# Patient Record
Sex: Female | Born: 1955 | Race: Black or African American | Hispanic: No | Marital: Single
Health system: Southern US, Community
[De-identification: ages and names within clinical notes are randomized; demographics above are authoritative.]

## PROBLEM LIST (undated history)

## (undated) DIAGNOSIS — G43909 Migraine, unspecified, not intractable, without status migrainosus: Secondary | ICD-10-CM

## (undated) DIAGNOSIS — F32A Depression, unspecified: Secondary | ICD-10-CM

## (undated) DIAGNOSIS — F1410 Cocaine abuse, uncomplicated: Secondary | ICD-10-CM

## (undated) DIAGNOSIS — J45909 Unspecified asthma, uncomplicated: Secondary | ICD-10-CM

## (undated) DIAGNOSIS — F1020 Alcohol dependence, uncomplicated: Secondary | ICD-10-CM

## (undated) DIAGNOSIS — G8929 Other chronic pain: Secondary | ICD-10-CM

## (undated) DIAGNOSIS — F329 Major depressive disorder, single episode, unspecified: Secondary | ICD-10-CM

## (undated) DIAGNOSIS — M549 Dorsalgia, unspecified: Secondary | ICD-10-CM

## (undated) HISTORY — DX: Alcohol dependence, uncomplicated: F10.20

## (undated) HISTORY — PX: NO PAST SURGERIES: SHX2092

---

## 1998-05-07 ENCOUNTER — Emergency Department (HOSPITAL_COMMUNITY): Admission: EM | Admit: 1998-05-07 | Discharge: 1998-05-08 | Payer: Self-pay | Admitting: Emergency Medicine

## 1998-07-30 ENCOUNTER — Encounter: Admission: RE | Admit: 1998-07-30 | Discharge: 1998-07-30 | Payer: Self-pay | Admitting: Internal Medicine

## 1998-07-30 ENCOUNTER — Ambulatory Visit: Admission: RE | Admit: 1998-07-30 | Discharge: 1998-07-30 | Payer: Self-pay | Admitting: Internal Medicine

## 1998-08-02 ENCOUNTER — Emergency Department (HOSPITAL_COMMUNITY): Admission: EM | Admit: 1998-08-02 | Discharge: 1998-08-02 | Payer: Self-pay | Admitting: Internal Medicine

## 1998-08-02 ENCOUNTER — Encounter: Payer: Self-pay | Admitting: Internal Medicine

## 1998-08-03 ENCOUNTER — Ambulatory Visit (HOSPITAL_COMMUNITY): Admission: RE | Admit: 1998-08-03 | Discharge: 1998-08-03 | Payer: Self-pay | Admitting: Internal Medicine

## 1998-08-03 ENCOUNTER — Encounter: Admission: RE | Admit: 1998-08-03 | Discharge: 1998-08-03 | Payer: Self-pay | Admitting: Internal Medicine

## 1998-09-02 ENCOUNTER — Ambulatory Visit (HOSPITAL_COMMUNITY): Admission: RE | Admit: 1998-09-02 | Discharge: 1998-09-02 | Payer: Self-pay | Admitting: Urology

## 1998-09-02 ENCOUNTER — Encounter: Payer: Self-pay | Admitting: Urology

## 1998-10-01 ENCOUNTER — Ambulatory Visit (HOSPITAL_BASED_OUTPATIENT_CLINIC_OR_DEPARTMENT_OTHER): Admission: RE | Admit: 1998-10-01 | Discharge: 1998-10-01 | Payer: Self-pay | Admitting: Urology

## 1999-01-22 ENCOUNTER — Ambulatory Visit (HOSPITAL_COMMUNITY): Admission: RE | Admit: 1999-01-22 | Discharge: 1999-01-22 | Payer: Self-pay | Admitting: *Deleted

## 1999-02-18 ENCOUNTER — Other Ambulatory Visit: Admission: RE | Admit: 1999-02-18 | Discharge: 1999-02-18 | Payer: Self-pay | Admitting: Obstetrics

## 1999-03-16 ENCOUNTER — Ambulatory Visit (HOSPITAL_COMMUNITY): Admission: RE | Admit: 1999-03-16 | Discharge: 1999-03-16 | Payer: Self-pay | Admitting: *Deleted

## 1999-03-16 ENCOUNTER — Encounter: Payer: Self-pay | Admitting: *Deleted

## 1999-04-28 ENCOUNTER — Encounter: Payer: Self-pay | Admitting: Emergency Medicine

## 1999-04-28 ENCOUNTER — Emergency Department (HOSPITAL_COMMUNITY): Admission: EM | Admit: 1999-04-28 | Discharge: 1999-04-28 | Payer: Self-pay | Admitting: Emergency Medicine

## 1999-05-03 ENCOUNTER — Emergency Department (HOSPITAL_COMMUNITY): Admission: EM | Admit: 1999-05-03 | Discharge: 1999-05-03 | Payer: Self-pay | Admitting: Emergency Medicine

## 1999-05-17 ENCOUNTER — Encounter: Payer: Self-pay | Admitting: Specialist

## 1999-05-17 ENCOUNTER — Ambulatory Visit (HOSPITAL_COMMUNITY): Admission: RE | Admit: 1999-05-17 | Discharge: 1999-05-17 | Payer: Self-pay | Admitting: Specialist

## 1999-09-21 ENCOUNTER — Emergency Department (HOSPITAL_COMMUNITY): Admission: EM | Admit: 1999-09-21 | Discharge: 1999-09-21 | Payer: Self-pay | Admitting: Emergency Medicine

## 1999-11-12 ENCOUNTER — Emergency Department (HOSPITAL_COMMUNITY): Admission: EM | Admit: 1999-11-12 | Discharge: 1999-11-12 | Payer: Self-pay | Admitting: Emergency Medicine

## 2000-04-27 ENCOUNTER — Emergency Department (HOSPITAL_COMMUNITY): Admission: EM | Admit: 2000-04-27 | Discharge: 2000-04-27 | Payer: Self-pay | Admitting: Emergency Medicine

## 2000-05-18 ENCOUNTER — Emergency Department (HOSPITAL_COMMUNITY): Admission: EM | Admit: 2000-05-18 | Discharge: 2000-05-18 | Payer: Self-pay | Admitting: Emergency Medicine

## 2000-06-11 ENCOUNTER — Emergency Department (HOSPITAL_COMMUNITY): Admission: EM | Admit: 2000-06-11 | Discharge: 2000-06-11 | Payer: Self-pay | Admitting: Emergency Medicine

## 2000-08-01 ENCOUNTER — Encounter: Payer: Self-pay | Admitting: Emergency Medicine

## 2000-08-01 ENCOUNTER — Emergency Department (HOSPITAL_COMMUNITY): Admission: EM | Admit: 2000-08-01 | Discharge: 2000-08-01 | Payer: Self-pay | Admitting: Emergency Medicine

## 2001-02-18 ENCOUNTER — Emergency Department (HOSPITAL_COMMUNITY): Admission: EM | Admit: 2001-02-18 | Discharge: 2001-02-18 | Payer: Self-pay | Admitting: Emergency Medicine

## 2001-02-18 ENCOUNTER — Encounter: Payer: Self-pay | Admitting: Emergency Medicine

## 2001-04-11 ENCOUNTER — Encounter: Admission: RE | Admit: 2001-04-11 | Discharge: 2001-07-10 | Payer: Self-pay | Admitting: Occupational Medicine

## 2001-06-17 ENCOUNTER — Emergency Department (HOSPITAL_COMMUNITY): Admission: EM | Admit: 2001-06-17 | Discharge: 2001-06-17 | Payer: Self-pay | Admitting: Emergency Medicine

## 2001-07-02 ENCOUNTER — Emergency Department (HOSPITAL_COMMUNITY): Admission: EM | Admit: 2001-07-02 | Discharge: 2001-07-02 | Payer: Self-pay | Admitting: Emergency Medicine

## 2001-12-18 ENCOUNTER — Emergency Department (HOSPITAL_COMMUNITY): Admission: EM | Admit: 2001-12-18 | Discharge: 2001-12-18 | Payer: Self-pay | Admitting: Emergency Medicine

## 2002-03-12 ENCOUNTER — Inpatient Hospital Stay (HOSPITAL_COMMUNITY): Admission: EM | Admit: 2002-03-12 | Discharge: 2002-03-25 | Payer: Self-pay | Admitting: Psychiatry

## 2002-10-26 ENCOUNTER — Inpatient Hospital Stay (HOSPITAL_COMMUNITY): Admission: EM | Admit: 2002-10-26 | Discharge: 2002-11-04 | Payer: Self-pay | Admitting: Psychiatry

## 2002-11-11 ENCOUNTER — Emergency Department (HOSPITAL_COMMUNITY): Admission: EM | Admit: 2002-11-11 | Discharge: 2002-11-11 | Payer: Self-pay | Admitting: Physical Therapy

## 2003-01-15 ENCOUNTER — Emergency Department (HOSPITAL_COMMUNITY): Admission: EM | Admit: 2003-01-15 | Discharge: 2003-01-15 | Payer: Self-pay

## 2003-03-05 ENCOUNTER — Inpatient Hospital Stay (HOSPITAL_COMMUNITY): Admission: EM | Admit: 2003-03-05 | Discharge: 2003-03-10 | Payer: Self-pay | Admitting: Psychiatry

## 2003-09-07 ENCOUNTER — Emergency Department (HOSPITAL_COMMUNITY): Admission: EM | Admit: 2003-09-07 | Discharge: 2003-09-08 | Payer: Self-pay | Admitting: Emergency Medicine

## 2003-10-14 ENCOUNTER — Emergency Department (HOSPITAL_COMMUNITY): Admission: EM | Admit: 2003-10-14 | Discharge: 2003-10-14 | Payer: Self-pay | Admitting: Emergency Medicine

## 2004-01-31 ENCOUNTER — Emergency Department (HOSPITAL_COMMUNITY): Admission: EM | Admit: 2004-01-31 | Discharge: 2004-01-31 | Payer: Self-pay | Admitting: Emergency Medicine

## 2004-06-27 ENCOUNTER — Inpatient Hospital Stay (HOSPITAL_COMMUNITY): Admission: AD | Admit: 2004-06-27 | Discharge: 2004-07-07 | Payer: Self-pay | Admitting: Psychiatry

## 2004-07-13 ENCOUNTER — Emergency Department (HOSPITAL_COMMUNITY): Admission: EM | Admit: 2004-07-13 | Discharge: 2004-07-13 | Payer: Self-pay | Admitting: Emergency Medicine

## 2004-07-16 ENCOUNTER — Ambulatory Visit: Payer: Self-pay | Admitting: Psychiatry

## 2004-10-22 ENCOUNTER — Emergency Department (HOSPITAL_COMMUNITY): Admission: EM | Admit: 2004-10-22 | Discharge: 2004-10-22 | Payer: Self-pay | Admitting: Emergency Medicine

## 2005-02-20 ENCOUNTER — Emergency Department (HOSPITAL_COMMUNITY): Admission: EM | Admit: 2005-02-20 | Discharge: 2005-02-20 | Payer: Self-pay | Admitting: Emergency Medicine

## 2005-09-03 ENCOUNTER — Inpatient Hospital Stay (HOSPITAL_COMMUNITY): Admission: RE | Admit: 2005-09-03 | Discharge: 2005-09-13 | Payer: Self-pay | Admitting: Psychiatry

## 2005-09-03 ENCOUNTER — Ambulatory Visit: Payer: Self-pay | Admitting: Psychiatry

## 2005-09-11 ENCOUNTER — Encounter: Payer: Self-pay | Admitting: Psychiatry

## 2006-03-27 ENCOUNTER — Inpatient Hospital Stay (HOSPITAL_COMMUNITY): Admission: EM | Admit: 2006-03-27 | Discharge: 2006-03-28 | Payer: Self-pay | Admitting: Emergency Medicine

## 2006-03-28 ENCOUNTER — Inpatient Hospital Stay (HOSPITAL_COMMUNITY): Admission: RE | Admit: 2006-03-28 | Discharge: 2006-04-05 | Payer: Self-pay | Admitting: Psychiatry

## 2006-03-29 ENCOUNTER — Ambulatory Visit: Payer: Self-pay | Admitting: Psychiatry

## 2006-11-15 ENCOUNTER — Ambulatory Visit: Payer: Self-pay | Admitting: Family Medicine

## 2007-01-03 ENCOUNTER — Emergency Department (HOSPITAL_COMMUNITY): Admission: EM | Admit: 2007-01-03 | Discharge: 2007-01-03 | Payer: Self-pay | Admitting: Emergency Medicine

## 2007-01-21 ENCOUNTER — Emergency Department (HOSPITAL_COMMUNITY): Admission: EM | Admit: 2007-01-21 | Discharge: 2007-01-21 | Payer: Self-pay | Admitting: Emergency Medicine

## 2007-01-26 ENCOUNTER — Ambulatory Visit: Payer: Self-pay | Admitting: Psychiatry

## 2007-01-26 ENCOUNTER — Inpatient Hospital Stay (HOSPITAL_COMMUNITY): Admission: AD | Admit: 2007-01-26 | Discharge: 2007-02-07 | Payer: Self-pay | Admitting: Psychiatry

## 2007-02-01 ENCOUNTER — Encounter (HOSPITAL_COMMUNITY): Payer: Self-pay | Admitting: Psychiatry

## 2007-06-18 ENCOUNTER — Emergency Department (HOSPITAL_COMMUNITY): Admission: EM | Admit: 2007-06-18 | Discharge: 2007-06-18 | Payer: Self-pay | Admitting: Emergency Medicine

## 2007-06-24 ENCOUNTER — Other Ambulatory Visit: Payer: Self-pay

## 2007-06-24 ENCOUNTER — Other Ambulatory Visit: Payer: Self-pay | Admitting: Emergency Medicine

## 2007-06-25 ENCOUNTER — Ambulatory Visit: Payer: Self-pay | Admitting: Psychiatry

## 2007-06-25 ENCOUNTER — Inpatient Hospital Stay (HOSPITAL_COMMUNITY): Admission: AD | Admit: 2007-06-25 | Discharge: 2007-07-05 | Payer: Self-pay | Admitting: Psychiatry

## 2007-06-26 ENCOUNTER — Encounter: Payer: Self-pay | Admitting: Psychiatry

## 2007-08-14 ENCOUNTER — Ambulatory Visit: Payer: Self-pay | Admitting: Family Medicine

## 2007-08-28 ENCOUNTER — Ambulatory Visit (HOSPITAL_COMMUNITY): Admission: RE | Admit: 2007-08-28 | Discharge: 2007-08-28 | Payer: Self-pay | Admitting: Family Medicine

## 2007-08-28 DIAGNOSIS — Z9889 Other specified postprocedural states: Secondary | ICD-10-CM | POA: Insufficient documentation

## 2007-08-28 DIAGNOSIS — M1990 Unspecified osteoarthritis, unspecified site: Secondary | ICD-10-CM | POA: Insufficient documentation

## 2007-08-28 DIAGNOSIS — F17200 Nicotine dependence, unspecified, uncomplicated: Secondary | ICD-10-CM | POA: Insufficient documentation

## 2007-08-28 DIAGNOSIS — J449 Chronic obstructive pulmonary disease, unspecified: Secondary | ICD-10-CM

## 2007-08-28 DIAGNOSIS — J4489 Other specified chronic obstructive pulmonary disease: Secondary | ICD-10-CM | POA: Insufficient documentation

## 2007-09-14 ENCOUNTER — Ambulatory Visit: Payer: Self-pay | Admitting: Family Medicine

## 2007-11-19 ENCOUNTER — Ambulatory Visit: Payer: Self-pay | Admitting: Family Medicine

## 2007-11-19 LAB — CONVERTED CEMR LAB
ALT: 10 units/L (ref 0–35)
AST: 13 units/L (ref 0–37)
Albumin: 4.3 g/dL (ref 3.5–5.2)
Alkaline Phosphatase: 134 units/L — ABNORMAL HIGH (ref 39–117)
BUN: 15 mg/dL (ref 6–23)
Basophils Absolute: 0 10*3/uL (ref 0.0–0.1)
Basophils Relative: 0 % (ref 0–1)
CO2: 24 meq/L (ref 19–32)
Calcium: 9.8 mg/dL (ref 8.4–10.5)
Chloride: 103 meq/L (ref 96–112)
Creatinine, Ser: 0.87 mg/dL (ref 0.40–1.20)
Eosinophils Absolute: 0.5 10*3/uL (ref 0.0–0.7)
Eosinophils Relative: 8 % — ABNORMAL HIGH (ref 0–5)
Glucose, Bld: 83 mg/dL (ref 70–99)
HCT: 41.4 % (ref 36.0–46.0)
Hemoglobin: 13.3 g/dL (ref 12.0–15.0)
Lymphocytes Relative: 32 % (ref 12–46)
Lymphs Abs: 2 10*3/uL (ref 0.7–4.0)
MCHC: 32.1 g/dL (ref 30.0–36.0)
MCV: 87 fL (ref 78.0–100.0)
Monocytes Absolute: 0.6 10*3/uL (ref 0.1–1.0)
Monocytes Relative: 10 % (ref 3–12)
Neutro Abs: 3.1 10*3/uL (ref 1.7–7.7)
Neutrophils Relative %: 50 % (ref 43–77)
Platelets: 343 10*3/uL (ref 150–400)
Potassium: 4.1 meq/L (ref 3.5–5.3)
RBC: 4.76 M/uL (ref 3.87–5.11)
RDW: 13.4 % (ref 11.5–15.5)
Sodium: 142 meq/L (ref 135–145)
Total Bilirubin: 0.4 mg/dL (ref 0.3–1.2)
Total Protein: 7.1 g/dL (ref 6.0–8.3)
WBC: 6.1 10*3/uL (ref 4.0–10.5)

## 2007-11-22 ENCOUNTER — Ambulatory Visit (HOSPITAL_COMMUNITY): Admission: RE | Admit: 2007-11-22 | Discharge: 2007-11-22 | Payer: Self-pay | Admitting: Family Medicine

## 2007-11-28 ENCOUNTER — Emergency Department (HOSPITAL_COMMUNITY): Admission: EM | Admit: 2007-11-28 | Discharge: 2007-11-28 | Payer: Self-pay | Admitting: Emergency Medicine

## 2007-12-31 ENCOUNTER — Encounter (INDEPENDENT_AMBULATORY_CARE_PROVIDER_SITE_OTHER): Payer: Self-pay | Admitting: Family Medicine

## 2007-12-31 ENCOUNTER — Ambulatory Visit: Payer: Self-pay | Admitting: Family Medicine

## 2007-12-31 ENCOUNTER — Other Ambulatory Visit: Admission: RE | Admit: 2007-12-31 | Discharge: 2007-12-31 | Payer: Self-pay | Admitting: Family Medicine

## 2008-07-14 ENCOUNTER — Emergency Department (HOSPITAL_COMMUNITY): Admission: EM | Admit: 2008-07-14 | Discharge: 2008-07-15 | Payer: Self-pay | Admitting: Emergency Medicine

## 2009-01-26 ENCOUNTER — Emergency Department (HOSPITAL_COMMUNITY): Admission: EM | Admit: 2009-01-26 | Discharge: 2009-01-26 | Payer: Self-pay | Admitting: Emergency Medicine

## 2009-10-05 ENCOUNTER — Emergency Department (HOSPITAL_COMMUNITY): Admission: EM | Admit: 2009-10-05 | Discharge: 2009-10-05 | Payer: Self-pay | Admitting: Emergency Medicine

## 2009-10-07 ENCOUNTER — Emergency Department (HOSPITAL_COMMUNITY): Admission: EM | Admit: 2009-10-07 | Discharge: 2009-10-08 | Payer: Self-pay | Admitting: Emergency Medicine

## 2010-03-10 ENCOUNTER — Emergency Department (HOSPITAL_COMMUNITY): Admission: EM | Admit: 2010-03-10 | Discharge: 2010-03-10 | Payer: Self-pay | Admitting: Emergency Medicine

## 2010-11-13 ENCOUNTER — Encounter: Payer: Self-pay | Admitting: Obstetrics

## 2011-01-25 LAB — RAPID URINE DRUG SCREEN, HOSP PERFORMED
Amphetamines: NOT DETECTED
Amphetamines: NOT DETECTED
Barbiturates: NOT DETECTED
Benzodiazepines: NOT DETECTED
Benzodiazepines: NOT DETECTED
Cocaine: POSITIVE — AB
Opiates: NOT DETECTED
Opiates: NOT DETECTED
Tetrahydrocannabinol: NOT DETECTED
Tetrahydrocannabinol: NOT DETECTED

## 2011-01-25 LAB — BASIC METABOLIC PANEL
BUN: 11 mg/dL (ref 6–23)
CO2: 29 mEq/L (ref 19–32)
Calcium: 9.1 mg/dL (ref 8.4–10.5)
Chloride: 103 mEq/L (ref 96–112)
Chloride: 106 mEq/L (ref 96–112)
Creatinine, Ser: 1.04 mg/dL (ref 0.4–1.2)
GFR calc Af Amer: >60 mL/min (ref >60)
GFR calc Af Amer: >60 mL/min (ref >60)
GFR calc non Af Amer: 52 mL/min — ABNORMAL LOW (ref >60)
GFR calc non Af Amer: 55 mL/min — ABNORMAL LOW (ref >60)
Glucose, Bld: 137 mg/dL — ABNORMAL HIGH (ref 70–99)
Potassium: 3.4 mEq/L — ABNORMAL LOW (ref 3.5–5.1)
Potassium: 3.9 mEq/L (ref 3.5–5.1)
Sodium: 139 mEq/L (ref 135–145)
Sodium: 142 mEq/L (ref 135–145)

## 2011-01-25 LAB — DIFFERENTIAL
Eosinophils Absolute: 0.2 10*3/uL (ref 0.0–0.7)
Eosinophils Relative: 4 % (ref 0–5)
Lymphocytes Relative: 43 % (ref 12–46)
Lymphs Abs: 1.9 10*3/uL (ref 0.7–4.0)
Monocytes Relative: 8 % (ref 3–12)

## 2011-01-25 LAB — CBC
HCT: 41.9 % (ref 36.0–46.0)
HCT: 42 % (ref 36.0–46.0)
Hemoglobin: 13.7 g/dL (ref 12.0–15.0)
MCHC: 32.6 g/dL (ref 30.0–36.0)
MCV: 88.6 fL (ref 78.0–100.0)
MCV: 88.8 fL (ref 78.0–100.0)
Platelets: 313 10*3/uL (ref 150–400)
RBC: 4.72 MIL/uL (ref 3.87–5.11)
RBC: 4.74 MIL/uL (ref 3.87–5.11)
RDW: 13.6 % (ref 11.5–15.5)
WBC: 4.1 10*3/uL (ref 4.0–10.5)
WBC: 4.3 10*3/uL (ref 4.0–10.5)

## 2011-01-25 LAB — ETHANOL
Alcohol, Ethyl (B): <5 mg/dL (ref 0–10)
Alcohol, Ethyl (B): <5 mg/dL (ref 0–10)

## 2011-03-08 NOTE — H&P (Signed)
NAMEBEATRIS, [name]               ACCOUNT NO.:  [id number]   MEDICAL RECORD NO.:  [id number]          PATIENT TYPE:  IPS   LOCATION:  406                           [facility]ILITY[facility]  [facility]   PHYSICIAN:  Anselm Jungling, MD  DATE OF BIRTH:  10-07-1956   DATE OF ADMISSION:  06/25/2007  DATE OF DISCHARGE:                       PSYCHIATRIC ADMISSION ASSESSMENT   IDENTIFICATION:  This is a  55 year old African American female.  This  is a voluntary admission.   HISTORY OF PRESENT ILLNESS:  This patient presented in the emergency  department with suicidal thoughts after being evicted from [place] and  [address] for missing curfew one time.  This is a drug rehab house  for women.  The patient has been living there for approximately 2 weeks  and relapsed on cocaine and alcohol one time after she was evicted after  3 months of abstinence.  The patient says that she felt hopeless at the  idea of her homelessness and used scissors to make long superficial  lacerations along her left arm.  She endorses suicidal thoughts at the  thought of having to go back on the street.  She denies any a  hallucinations or homicidal thoughts.   PAST PSYCHIATRIC HISTORY:  This is one of multiple admissions for this  single [place] American female who is well-known to [place], last here April  4 to the 16th.  Longest abstinence from substances is 2 years.  She does  have a history of multiple suicide attempts by overdose, basic education  and is on social security disability for mental illness, has two  daughters ages 74 and 43, frequently homeless.  No current legal  charges.  Please note the patient has a history of a schizoaffective  disorder.   FAMILY HISTORY:  Is remarkable for sister that she says is supportive,  denies a family history of substance abuse.   MEDICAL HISTORY:  The patient has no regular primary care Ambera Fedele.  She  currently has superficial lacerations of her left arm and cough for at  least the  past 7 days accompanied by upper respiratory congestion.   PAST MEDICAL HISTORY:  Is remarkable for history of pneumonia and  polysubstance abuse, schizoaffective disorder, tobacco abuse smoking one  to one and half packs per day and poor dentition.   CURRENT MEDICATIONS:  Prozac 20 mg daily, Seroquel 400 mg p.o. q.h.s.   DRUG ALLERGIES:  None.   REVIEW OF SYSTEMS:  Was remarkable for complaining of possibly low grade  fever and cough for somewhere between seven and 10 days, productive of  yellow sputum.  No known weight loss.  No fever or chills.  Some mild to  moderate dyspnea on exertion and shortness of breath with exertion,  sensation of chest tightness but no acute chest pain.  EXTREMITIES:  No  peripheral edema.  SKIN is intact.  ABDOMEN is soft, nontender,  nondistended.  Afebrile.   PHYSICAL EXAM:  Was remarkable for coarse rales throughout, along with  left lower lobe wheezing, inspiratory and expiratory wheezes.  Full  physical exam done in the emergency room and is  noted in the record.   LABORATORY DATA:  CBC, WBC 11.0, hemoglobin 12.7, hematocrit 37.7,  platelets 383,000.  Alcohol level 37.  Chemistry, sodium 139, potassium  3.6, chloride 100, carbon dioxide 24, BUN 16 and creatinine 1.03 and  random glucose 101.  Urine drug screen positive for cocaine.  .  Salicylate less than 4,  acetaminophen less than 10.   MENTAL STATUS EXAM:  Fully alert female with poor eye contact, blunted,  a downcast gaze, cooperative, engaged in conversation but minimal speech  production.  Speech is soft, almost inaudible, most of the  time 1 or 2  word answers, positive for suicidal thoughts, fears that she will be out  on the streets again, no homicidal thought.  Denying any current  hallucinations.  Cognition preserved.   ASSESSMENT:  AXIS I:  Major depression recurrent, severe.  Polysubstance  abuse.  AXIS II:  Deferred  AXIS III:  Bronchitis, rule out pneumonia, and superficial  lacerations  left arm.  AXIS IV:  Severe issues with homelessness.  AXIS V:  Current 38 past year 69.   PLAN:  Is to voluntarily admit the patient with q. 15-minute checks in  place to alleviate suicidal ideation.  We are going to restart her  Seroquel 400 mg at bedtime and 50 mg q.4 h p.r.n. for agitation,  continue her Prozac 20 mg daily and will also put her on folic acid,  thiamine and MVI.  She denies any alcohol withdrawal symptoms at this  point.  Meanwhile we are going to get a chest x-ray and start her on  azithromycin and DuoNeb treatments b.i.d. and we will monitor those  lacerations closely for signs of infection, currently giving her some  topical Neosporin ointment.  Our Child psychotherapist has agreed to try to  [facility] and [facility] into taking her back.   Estimated length of stay 7 days      Margaret A. Lorin Picket, N.P.      Anselm Jungling, MD  Electronically Signed    MAS/MEDQ  D:  06/26/2007  T:  06/26/2007  Job:  [id number]

## 2011-03-11 NOTE — Discharge Summary (Signed)
[name], [name]                         ACCOUNT NO.:  [id number]   MEDICAL RECORD NO.:  [id number]                   PATIENT TYPE:  IPS   LOCATION:  0508                                 FACILITY:  [facility]   PHYSICIAN:  Jeanice Lim, M.D.              DATE OF BIRTH:  12/29/1955   DATE OF ADMISSION:  03/05/2003  DATE OF DISCHARGE:  03/10/2003                                 DISCHARGE SUMMARY   IDENTIFYING DATA:  This is a 55 year old African-American female voluntarily  admitted.  She presented with a history of self-inflicted injuries, hitting  things, scratching.  She reports she did this because she missed her family,  she had been drinking, using crack cocaine and marijuana, and reports  auditory hallucinations although unable to remember what the voices say.   MEDICATIONS:  Effexor in the past, noncompliant.   DRUG ALLERGIES:  No known drug allergies.   PHYSICAL EXAMINATION:  GENERAL:  Essentially within normal limits.  NEUROLOGIC:  Nonfocal.   LABORATORY DATA:  CBC and CMET: Within normal limits except for low  potassium at 3.1.   MENTAL STATUS EXAM:  The patient was in bed, dressed in pajamas, poor eye  contact.  Speech: Clear.  Mood: Depressed.  Affect: Flat.  Thought process:  Goal directed.  Thought content: Positive for auditory hallucinations,  reporting some visual hallucinations and questionable paranoid ideation,  although the patient did not appear to be actively responding to internal  stimuli.  Cognitive: Intact.  The patient was a poor historian.  Judgment  and insight: Poor.  Poor impulse control.   ADMISSION DIAGNOSES:   AXIS I:  1. Major depressive disorder, recurrent, severe with psychotic features.  2. Rule out substance-induced mood disorder.  3. Alcohol dependence.  4. Cocaine dependence.   AXIS II:  Deferred.   AXIS III:  None.   AXIS IV:  Moderate problems with primary support group and other  psychosocial issues.   AXIS V:   25/60   HOSPITAL COURSE:  The patient was admitted, ordered routine p.r.n.  medications, underwent further monitoring, and was encouraged to participate  in individual, group, and milieu therapy.  The patient was resumed on  Effexor and Seroquel p.r.n.  Seroquel was optimized, targeting psychotic  symptoms, and Effexor discontinued.  Prozac was started.  Symmetrel was  ordered for her cocaine cravings and then Risperdal was added for continued  psychotic symptoms.  The patient gradually reported a positive response,  tolerated withdrawal symptoms, requiring Librium for breakthrough  withdrawal, and reported a positive response to clinical intervention.   CONDITION ON DISCHARGE:  Her condition on discharge was markedly improved.  Mood was more euthymic.  Affect: Brighter.  Thought processes: Goal  directed.  Thought content: Negative for dangerous ideation or psychotic  symptoms.  The patient reported motivation to be compliant with the  aftercare plan.   DISCHARGE MEDICATIONS:  1. Symmetrel 100 mg  b.i.d.  2. Seroquel 300 mg q.h.s.  3. Prozac 10 mg three q.a.m.  4. Risperdal 0.5 mg one half at 12 p.m. and three q.h.s.  5. Motrin 600 mg q.6.h. p.r.n. pain.   FOLLOW UP:  The patient was discharged to follow up at [facility] on May 20 at 9:30.   DISCHARGE DIAGNOSES:   AXIS I:  1. Major depressive disorder, recurrent, severe with psychotic features.  2. Rule out substance-induced mood disorder.  3. Alcohol dependence.  4. Cocaine dependence.   AXIS II:  Deferred.   AXIS III:  None.   AXIS IV:  Moderate problems with primary support group and other  psychosocial issues.   AXIS V:  Global assessment of functioning on discharge was 55.                                               Jeanice Lim, M.D.    JEM/MEDQ  D:  04/03/2003  T:  04/03/2003  Job:  [id number]
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
## 2011-03-11 NOTE — Discharge Summary (Signed)
[name], [name]               ACCOUNT NO.:  [id number]   MEDICAL RECORD NO.:  [id number]          PATIENT TYPE:  IPS   LOCATION:  0303                          [facility]ACILITY:  [facility]   PHYSICIAN:  Anselm Jungling, MD  DATE OF BIRTH:  10-02-1956   DATE OF ADMISSION:  01/26/2007  DATE OF DISCHARGE:  02/07/2007                               DISCHARGE SUMMARY   IDENTIFYING DATA AND REASON FOR ADMISSION:  This as an inpatient  psychiatric admission for [name], a 55 year old single female, who  presented with a history of polysubstance abuse, depression and suicidal  thoughts.  She had been drinking up to half a gallon of liquor daily and  also using crack cocaine and marijuana.  Please refer to the admission  note for further details pertaining to the symptoms, circumstances, and  history that led to her hospitalization.  She was given initial Axis I  diagnoses of mood disorder NOS, rule out schizoaffective disorder,  alcohol dependence, and polysubstance abuse.   MEDICAL AND LABORATORY:  The patient was initially treated at [facility] emergency department where she was given prednisone and Z-Pak for  basilar pneumonia.  She also received supplemental potassium there for  hypokalemia.  Upon admission here, her CBC was within normal limits,  urine pregnancy was negative.  Drug screen was positive for cocaine and  opiates.  She presented as a thin, weak-appearing, middle-aged female  with productive cough.   She remained ill with profound fatigue and coughing over the first 10  days of her hospital stay and was not able to get out of bed very much  to participate in therapeutic groups and activities.  Her appetite was  poor but she was willing to take Ensure.  Towards the latter portion of  her inpatient stay, her condition gradually improved and she was more  active in the milieu program.   She was also given Protonix, 40 mg daily, and Carafate, 1 gram four  times daily, for  epigastric discomfort, with good results.  Throughout,  she was given guaifenesin up to four times daily for cough.   HOSPITAL COURSE:  As above, She presented with essentially normal mental  status with the exception of significant depression which was  exacerbated by her feeling ill and tired.  She was continued on a  regimen of Prozac and Seroquel and tolerated these well.   A challenge for this patient was determining appropriate aftercare for  her, given that she had limited available supports.  She worked closely  with the Child psychotherapist and Sports coach.   The patient had been absent suicidal ideation throughout her inpatient  stay.  On the 13th hospital day, she appeared appropriate for discharge.  She agreed to the following aftercare plan.  The patient was to follow  up at the [facility] by presenting for a walk-in appointment on any  week day and she was instructed as to how and where to do so.  She was  also connected with [facility] for further supportive  aftercare.   DISCHARGE MEDICATIONS:  1. Prozac  40 mg daily.  2. Seroquel 100 mg q.h.s.  3. Protonix 40 mg daily.  4. Carafate 1 gram four times daily.  5. Robitussin 2 teaspoons four times per day.   DISCHARGE DIAGNOSES:  AXIS I: Major depressive disorder, recurrent, and  polysubstance abuse.  AXIS II: Deferred.  AXIS III: History of gastroesophageal reflux disease, basilar pneumonia,  resolving, poor nutrition.  AXIS IV: Stressors severe.  AXIS V: Global Assessment of Functioning (GAF) on discharge 65.      Anselm Jungling, MD  Electronically Signed     SPB/MEDQ  D:  02/19/2007  T:  02/19/2007  Job:  [id number]

## 2011-03-11 NOTE — Discharge Summary (Signed)
[facility]  Patient:    FAYDRA, KORMAN Visit Number: [id number] MRN: [id number]          Service Type: PSY Location: 300 0300 02 Attending Physician:  Rachael Fee Dictated by:   Reymundo Poll Dub Mikes, M.D. Admit Date:  03/12/2002 Discharge Date: 03/25/2002                             Discharge Summary  CHIEF COMPLAINT AND PRESENT ILLNESS:  This was the first admission to [facility] for this 55 year old female voluntarily admitted with history of depression.  Also ___________ boyfriend died of cancer three months prior to this admission.  Sister brought her to mental health for evaluation and referred for inpatient admission.  Having thoughts of overdosing on medication according to the ACT.  She admitted to getting to the edge, could not take her depression anymore.  Endorsed weight loss of more than 40 pounds.  Some of it was because she did not have any money to obtain her medication.  Has not been able to keep appointments or get her medication on a regular basis.  Endorsed increased fatigue and lack of energy, progressive anhedonia.  Also reports auditory hallucinations intermittently. No homicidal ideation.  No visual hallucinations.  Claimed that the Prozac did work if she was taking regularly.  PAST PSYCHIATRIC HISTORY:  [facility].  One time prior to [facility].  Prior detox to marijuana and alcohol in 1992 in [place].  ALCOHOL/DRUG HISTORY:  Denies any current substance use, although she has a prior history.  MEDICAL HISTORY:  Migraine headaches, chronic back pain.  No sexually transmitted disease.  MEDICATIONS:  Prozac 40 mg daily, doxepin 50 mg every day, Vioxx 25 mg.  PHYSICAL EXAMINATION:  Performed and failed to show any acute findings.  MENTAL STATUS EXAMINATION:  Normal ____________ lady.  She has a flattened affect.  Poor eye contact.  Cooperative.  Appears mildly guarded.   Speech shows latency, soft in tone, normal in pace.  Intermittent answers, no spontaneity.  Withdrawn mood, mildly guarded.  Thought processes positive for thought-blocking, suicidal ideation with no specific plan.  Can contract for safety.  ADMISSION DIAGNOSES: Axis I:    1. Major depression, recurrent, severe, with psychotic features.            2. Alcohol and marijuana abuse, in remission. Axis II:   No diagnosis. Axis III:  1. Osteoarthritis.            2. Chronic low back pain. Axis IV:   Moderate. Axis V:    Global Assessment of Functioning upon admission 26; highest Global            Assessment of Functioning in the last year 62.  LABORATORY DATA:  CBC was within normal limits.  Blood chemistries were within normal limits.  Thyroid profile was within normal limits.  Drug screen was negative for substances of abuse.  HOSPITAL COURSE:  She was admitted and started intensive individual and group psychotherapy.  She wants to be placed back on Prozac.  She was kept on the doxepin.  Initially on Risperdal and was switched to Zyprexa.  She felt that this Prozac was, at the end, not effective, so weaned off Prozac and started Effexor.  As she went off the Prozac, she felt more depressed, endorsed suicidal ideation.  Zyprexa was increased.  That seemed to have helped.  There  were some dreams about killing herself.  Still endorsing some suicidal ruminations with thoughts to hurt herself, especially first thing in the morning.  We went ahead and started Zyprexa 2.5 mg in the morning and kept the 15 mg at night.  Went up to Effexor 112.5 mg twice a day.  Slowly, she started improving and, by March 25, 2002, she did admit she was doing better.  Mood was improved objectively.  She was brighter.  She was taking care of herself.  She was going to group.  She denied any further suicidal or homicidal ideation. Felt that the medication was finally kicking in and felt stable and safe enough to go  home.  DISCHARGE DIAGNOSES: Axis I:    Major depression, recurrent, with psychotic features. Axis II:   No diagnosis. Axis III:  1. Osteoarthritis.            2. Chronic low back pain. Axis IV:   Moderate. Axis V:    Global Assessment of Functioning upon discharge 55.  DISCHARGE MEDICATIONS: 1. Doxepin 50 mg at bedtime. 2. Vioxx 25 mg daily. 3. Zyprexa 5 mg in the morning, 15 mg at night. 4. Effexor XR 112.5 mg twice a day.  FOLLOW-UP:  [facility]. Dictated by:   Reymundo Poll Dub Mikes, M.D. Attending Physician:  Rachael Fee DD:  05/01/02 TD:  05/02/02 Job: [id number]

## 2011-03-11 NOTE — Discharge Summary (Signed)
NAME:  [name], MOULTRY                         ACCOUNT NO.:  [id number]   MEDICAL RECORD NO.:  [id number]                   PATIENT TYPE:  IPS   LOCATION:  0508                                 FACILITY:  [facility]   PHYSICIAN:  Jeanice Lim, M.D.              DATE OF BIRTH:  [birth year]   DATE OF ADMISSION:  10/26/2002  DATE OF DISCHARGE:  11/04/2002                                 DISCHARGE SUMMARY   IDENTIFYING DATA:  This is a female with a history of resuming alcohol,  marijuana and cocaine use.  First holiday season she experienced without her  husband, who died of lymphoma.  She is severely depressed, reporting  auditory hallucinations at the time of admission, and was suicidal.   ADMISSION MEDICATIONS:  Effexor which she had been noncompliant with.   ALLERGIES:  No known drug allergies.   PHYSICAL EXAMINATION:  Essentially within normal limits, neurologically  nonfocal.   ROUTINE ADMISSION LABS:  Essentially within normal limits.   MENTAL STATUS EXAM:  Mood was irritable and profoundly depressed, affect  restricted and blunted.  Thought process slowed but goal directed.  Thought  content positive for auditory hallucinations telling her that she should  join her husband and that was in her husband's voice.  Cognition intact.  Judgment and insight fair.   ADMISSION DIAGNOSES:   AXIS I:  1. Cocaine dependence.  2. Polysubstance abuse.  3. Major depression, severe, with psychotic features, recurrent.   AXIS II:  None.   AXIS III:  None.   AXIS IV:  Moderate, problems with primary support group and other  psychosocial issues.   AXIS V:  30/55-60.   HOSPITAL COURSE:  The patient was admitted and ordered routine p.r.n.  medications, underwent further monitoring, and was encouraged to participate  in individual, group and milieu therapy, including substance abuse  treatment.  The patient was placed on a low-dose Librium protocol and also  given Symmetrel for possible  cocaine cravings.  The patient was given Ultram  p.r.n. pain and Effexor was adjusted and Zyprexa started for auditory  hallucinations, which was titrated, and then changed to Risperdal due to  sedation from Zyprexa and continued auditory hallucinations.  The patient  reported gradual improvement with medication changes, with improvement in  mood, eating and sleeping better, reporting increase in hope and improved  judgment and insight, as well as resolution of psychotic symptoms as she was  stabilized on medications.  She denied side effects.   CONDITION ON DISCHARGE:  Markedly improved.  Mood was more euthymic, affect  brighter, thought process goal directed.  Thought content negative for  dangerous ideation or psychotic symptoms.  The patient reported motivation  to be compliant with the aftercare plan, including followup therapy and  substance abuse treatment.   DISCHARGE MEDICATIONS:  1. Effexor XR 150 mg q.a.m.  2. Risperdal 1 mg b.i.d.  3. Ambien 10 mg 1/2  q.h.s. p.r.n. sleep.  4. Vicodin 5/500 1 b.i.d. p.r.n. pain.   DISPOSITION:  The patient is to follow up at [facility] on January 14 at 10:30 with Dr. Lang Snow.   DISCHARGE DIAGNOSES:   AXIS I:  1. Cocaine dependence.  2. Polysubstance abuse.  3. Major depression, severe, with psychotic features, recurrent.   AXIS II:  None.   AXIS III:  None.   AXIS IV:  Moderate, problems with primary support group and other  psychosocial issues.   AXIS V:  Global assessment of function on discharge was 55.                                                 Jeanice Lim, M.D.    JEM/MEDQ  D:  11/13/2002  T:  11/13/2002  Job:  [id number]
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
## 2011-03-11 NOTE — H&P (Signed)
NAMENICHOLAS, [name]               ACCOUNT NO.:  [id number]   MEDICAL RECORD NO.:  [id number]          PATIENT TYPE:  IPS   LOCATION:  0403                          FACILITY:  [facility]   PHYSICIAN:  Anselm Jungling, MD  DATE OF BIRTH:  06/29/1956   DATE OF ADMISSION:  03/28/2006  DATE OF DISCHARGE:                         PSYCHIATRIC ADMISSION ASSESSMENT   DATE OF ASSESSMENT:  March 29, 2006 at 11:05 a.m.   IDENTIFYING INFORMATION:  This is a 55 year old African-American female who  is single.  This is a voluntary admission.   HISTORY OF PRESENT ILLNESS:  This is one of several admissions for this 68-  year-old with a history of schizoaffective disorder, auditory  hallucinations, and chronic substance abuse.  She reports that she had  stopped all of her medications about 2 months ago but is not able to offer  any specific reason why.  Relapsed on alcohol and has had escalating use to  the point where she says that she was drinking about 2 cases of beer daily  and using cocaine daily for at least a couple of weeks prior to admission.  She was initially admitted by way of the emergency room to the internal  medicine unit due to some bizarre motor twitching that she had been doing in  the emergency room.  On the medicine service, she was diagnosed with a  urinary tract infection and prescribed Cipro, given potassium for some  hypokalemia, and Ativan for agitation.  She endorsed suicidal ideation,  primarily passive, feeling that life is no worth living, and she has had  some auditory hallucinations, which she refused to elaborate on today.   PAST PSYCHIATRIC HISTORY:  This is one of multiple admissions to [facility] for this patient with a previous diagnosis of  schizoaffective disorder, with additional substance exacerbation of  psychosis, history of EtOH dependence and cocaine dependence and personality  disorder, not otherwise specified.  Last admission here  was in December of  2006, at which time she was stabilized on 2 mg of Risperdal and about 300 mg  of Seroquel daily and Prozac 20 mg daily.  Previous outpatient follow-up is  unclear.   SOCIAL HISTORY:  Single African-American female.  History of polysubstance  dependence.  Not currently employed.  Eleventh grade education.  She has  never married.  She is on Social Security disability.  Does have two  daughters, ages 23 and 56.  She has a history of homelessness.  Current home  situation is unclear.  No current legal charges.   FAMILY HISTORY:  Unavailable.   MEDICAL HISTORY:  The patient has no primary care Kieron Kantner.  She has a  history of tobacco abuse.  No known medical problems.  She has displayed  some rather bizarre motor twitching, which is believed to be elective  behavior.  She has previously reported a history of migraine headaches and  chronic back pain secondary to osteoarthritis and an old injury to her left  wrist, which has caused her some arthritic pain from time to time.   PAST MEDICATIONS:  Include  Prozac, Risperdal, Seroquel,  which apparently  she stopped.   CURRENT MEDICATIONS:  Protonix 40 mg daily, Cipro 500 mg p.o. b.i.d. for  urinary tract infection to be administered through June 7 and then  discontinue.  She was previously on Ativan on the internal medicine unit and  was started there on Seroquel 50 mg p.o. nightly.   DRUG ALLERGIES:  None.   POSITIVE PHYSICAL FINDINGS:  The patient's physical exam was done on the  internal medicine unit.  It is noted in the record.  On admission to the  unit here, this is a thin, frail, poorly nourished-appearing African-  American female, 5 feet 7 inches tall, 120 pounds.  Temperature 97.2, pulse  64, respirations 18, blood pressure 179/80.  She has asked to wear her  bandana on her head because she says she picks at her hair.  Other physical  findings while on admission here are some jerking of her extremities,   including legs, both arms, which appears to occur only during interviews.  When she is not aware that she is being observed, she has no twitching.  She  had some muscle weakness and was seen by physical therapy over on the  medicine unit.  Today, she has walked without assistance to the cafeteria  with an adequate strength and balance to her gait.  The patient has no  history of seizures.   DIAGNOSTIC STUDIES:  CBC:  WBC 7.4, hemoglobin 13, hematocrit 39, platelets  339,000.  Electrolytes:  Sodium 140, potassium 4.7, chloride 111, carbon  dioxide 26, BUN 10, creatinine 0.9, and platelets 117.  Liver enzymes:  SGOT  23, SGPT 17, alkaline phosphatase 71, and total bilirubin 0.7.  She did have  cardiac enzymes done as part of her workup in the emergency room, which were  unremarkable.  Her urine drug screen was positive for cocaine.  Urinalysis  was remarkable for large bacteria, positive blood and ketones.   MENTAL STATUS EXAMINATION:  This is a fully alert female who is  uncooperative.  She keeps her eyes closed, turns her head to the side.  Does  mumble minimal answers to questions.  Initiates no conversation on her own,  generally resistant to interview.  The answers that she does say are quite  garbled.  Voice is soft, barely audible.  I have to have her repeat several  answers so I can understand her.  Mood is depressed, ashamed.  She says she  feels bad about stopping her medications but will not engage much in any  kind of an interview.  She does have quite a bit of muscle jerking,  twitching of her legs during the interview when I observe her.  When she is  not aware, she does not have any muscle jerking.  She said she was told she  may have increased lead levels and that someone was going to test her for  this, but there is no record of testing for lead levels.  She endorses suicidal thought but having no current auditory hallucinations.  Cognitively, she is intact and oriented  times three.  Denies that she has a  specific plan for any suicide.  Insight is adequate.  Impulse control and  judgment within normal limits.   DIAGNOSES:  AXIS I:  Rule out schizoaffective disorder.  Rule out substance-  induced psychosis.  Alcohol abuse and dependence.  Cocaine dependence.  AXIS II:  Personality disorder, not otherwise specified.  AXIS III:  Urinary tract infection.  AXIS IV:  Deferred.  AXIS V:  Current 25, past year 9.   PLAN:  Voluntarily admit the patient with every-15-minute checks in place.  We are going to continue her Cipro as recommended by the medicine unit.  We  are going to restart her Prozac at 10 mg daily.  Increase her Seroquel to  200 mg p.o. nightly.  She has been up and about to meals and will continue a  Librium protocol today.   ESTIMATED LENGTH OF STAY:  Five to seven days.      Margaret A. Lorin Picket, N.P.      Anselm Jungling, MD  Electronically Signed    MAS/MEDQ  D:  03/29/2006  T:  03/30/2006  Job:  [id number]

## 2011-03-11 NOTE — H&P (Signed)
NAMEMARIJEAN, [name]                         ACCOUNT NO.:  [id number]   MEDICAL RECORD NO.:  [id number]                   PATIENT TYPE:  IPS   LOCATION:  0508                                 FACILITY:  [facility]   PHYSICIAN:  Jeanice Lim, M.D.              DATE OF BIRTH:  Dec 02, 1955   DATE OF ADMISSION:  03/05/2003  DATE OF DISCHARGE:  03/10/2003                         PSYCHIATRIC ADMISSION ASSESSMENT   IDENTIFYING INFORMATION:  A 55 year old African-American female voluntarily  admitted on Mar 05, 2003.   HISTORY OF PRESENT ILLNESS:  The patient presents with a history of self-  inflicted injuries.  She was hitting things and scratching, became very  angry.  She states that she did this because she misses her family.  She  reports that she has been drinking this past week, drinking a lot.  Also  has been using crack cocaine and marijuana, having positive auditory  hallucinations but she cannot remember what they are saying.  Also reports  visual hallucinations but denies any currently.  Her sleep has been poor.  She states that she feels afraid but is unable to say of what.  Her appetite  has been fair.  She has been noncompliant with her medication because she  states she forgets.   PAST PSYCHIATRIC HISTORY:  Third visit to [facility], was here  in January 2004.  She sees Dr. Hortencia Pilar at [facility].  She has a history of cutting, last episode was 2 months ago.   SOCIAL HISTORY:  She is a 55 year old single African-American female, 2  children ages 36 and 48.  She lives alone.  She receives SSI.  No legal  charges.   FAMILY HISTORY:  Unknown.   ALCOHOL DRUG HISTORY:  The patient smokes cigarettes, uses marijuana and  crack cocaine, has been having some alcohol use recently.   PAST MEDICAL HISTORY:  Primary care Kelechi Astarita is none.  Medical problems are  none.   MEDICATIONS:  Has been on Effexor but has been noncompliant.  Unsure of  the  dosage.   DRUG ALLERGIES:  No known allergies.   PHYSICAL EXAMINATION:  On the chart, with no significant findings.  Her  laboratory data:  CBC is within normal limits.  CMET:  Potassium of 3.1,  protein of 5.9, albumin off 3.2.   MENTAL STATUS EXAM:  She is in the bed.  She is dressed in pajamas.  No eye  contact.  Speech is clear.  Mood is depressed, affect is flat.  Thought  processes:  The patient endorsing positive auditory hallucinations, positive  visual hallucinations, some questionable paranoid ideation but does not  appear to be actively responding to internal stimuli.  Cognitive:  The  patient is a poor historian.  Her judgment and insight are poor, poor  impulse control.   ADMISSION DIAGNOSES:   AXIS I:  1. Major depression, recurrent, severe.  2.  Rule out substance-induced mood disorder.  3. Alcohol abuse.  4. Cocaine abuse.   AXIS II:  Deferred.   AXIS III:  None.   AXIS IV:  Problems with primary support group and other psychosocial  problems, noncompliance and drug use.   AXIS V:  Current is 25, this past year 65.   PLAN:  Voluntary admission for depression and psychosis and polysubstance  abuse.  Contract for safety, check every 15 minutes.  Will stabilize mood  and thinking so the patient can be safe.  Will check labs.  The patient is  to remain alcohol and drug free.  Will resume her Effexor.  Medication  compliance was discussed with the patient to optimize functioning and to  prevent relapse.  Librium will be available for withdrawal symptoms.  The  patient to follow up with mental health.   TENTATIVE LENGTH OF CARE:  3-5 days.      Landry Corporal, N.P.                       Jeanice Lim, M.D.    JO/MEDQ  D:  03/18/2003  T:  03/18/2003  Job:  [id number]

## 2011-03-11 NOTE — Discharge Summary (Signed)
[name], [name]               ACCOUNT NO.:  [id number]   MEDICAL RECORD NO.:  [id number]          PATIENT TYPE:  IPS   LOCATION:  0406                          FACILITY:  [facility]   PHYSICIAN:  Anselm Jungling, MD  DATE OF BIRTH:  Dec 12, 1955   DATE OF ADMISSION:  06/25/2007  DATE OF DISCHARGE:  07/04/2007                               DISCHARGE SUMMARY   IDENTIFYING DATA/REASON FOR ADMISSION:  This is an inpatient psychiatric  admission for [name], a 54 year old African-American female, who was  brought in by her sister.  She has had several admissions with [place]  previously for problems related to chronic substance abuse and  depression.  Again, she presented for similar reasons, having relapsed  on cocaine.  She also came to [place] with pneumonia.  Please refer to the  admission note for further details pertaining to the symptoms,  circumstances and history that led to her hospitalization.   INITIAL DIAGNOSTIC IMPRESSION:  She was given initial AXIS I diagnoses  of cocaine dependence, polysubstance abuse, and substance-induced mood  disorder.   MEDICAL/LABORATORY:  The patient had pneumonia at the time of her  admission.  She was medically and physically assessed by the psychiatric  nurse practitioner and followed throughout her stay.  She was treated  appropriately for her pneumonia, which resolved over the course of her  hospital stay.  There were no other significant medical issues.  She was  continued on her usual Protonix 40 mg daily for GERD.   HOSPITAL COURSE:  The patient was admitted to the adult inpatient  service.  She presented as a well-nourished, well-developed, but ill-  appearing woman who was fully oriented.  She was very tired and had very  Older energy.  She was quite depressed and withdrawn initially.  There  were no signs or symptoms of psychosis or delusionality.  There was no  active suicidal ideation.  She was continued on her usual psychotropic  regimen that  included Prozac and Seroquel.   She was involved in the therapeutic milieu and groups and activities  geared towards 12-step recovery, although she was a poor and reluctant  participant in the treatment program.  She was quite hopeless and did  not seem to hold out much hope for a better future for herself.   She worked with the casemanager towards developing an aftercare plan  that would include some form of stable, safe, sober housing.   She had some intermittent suicidal thoughts during her stay, but she was  able to identify these as related to her sense of hopelessness about  where she could go following discharge.   Eventually, she accepted our strong recommendation to once again accept  placement in a halfway house for sober women, an [employer] [facility].   On the 10th hospital day, the patient was discharged to an [facility]  interview.  She agreed to the following aftercare plan.   AFTERCARE:  The patient was to follow up at the [facility] with an  appointment with her psychiatrist on July 12, 2007.   DISCHARGE MEDICATIONS:  1. Prozac  20 mg daily.  2. Seroquel 200 mg q.h.s.  3. Protonix 40 mg daily.  4. Ventolin inhaler as needed.  5. Atrovent inhaler as needed.   DISCHARGE DIAGNOSES:  AXIS I:  Cocaine dependence, early remission.  Polysubstance abuse.  Substance-induced mood disorder.  AXIS II:  Deferred.  AXIS III:  Status post pneumonia.  AXIS IV:  Stressors:  Severe.  AXIS V:  GAF on discharge 50.      Anselm Jungling, MD  Electronically Signed     SPB/MEDQ  D:  07/06/2007  T:  07/06/2007  Job:  [phone]

## 2011-03-11 NOTE — H&P (Signed)
[name], [name]               ACCOUNT NO.:  [id number]   MEDICAL RECORD NO.:  [id number]          PATIENT TYPE:  IPS   LOCATION:  0401                          FACILITY:  [facility]   PHYSICIAN:  Anselm Jungling, MD  DATE OF BIRTH:  Nov 29, 1955   DATE OF ADMISSION:  09/03/2005  DATE OF DISCHARGE:                         PSYCHIATRIC ADMISSION ASSESSMENT   IDENTIFYING INFORMATION:  This is a voluntary admission to the services of  Dr. Geralyn Flash.  This is a 55 year old single African-American female.  Apparently, she presented at [facility] yesterday  requesting help for depression and relapse on substance abuse.  She was sent  to the emergency department for medical clearance.  While in the emergency  room, she was reporting suicidal ideation with unspecified auditory and  visual hallucinations.  She states that she has been using alcohol and  cocaine since last discharge two years ago.  No specific situational  stressor identified at this time.  She reports that she hates herself, her  life and wants to go to sleep and never wake up.  She states that she does  hear auditory voices calling her name and she has self-mutilated in the past  by burning herself.  She was not able to contract for safety.  Admission was  felt to be advisable.   PAST PSYCHIATRIC HISTORY:  She was last here in 2004.  She has two inpatient  admissions at that time and she has been followed as an outpatient since  1998 at [facility].   SOCIAL HISTORY:  She states that she went to the 11th grade.  She never  married.  She gets income from [employer].  She states she has a daughter in her  78s and a daughter in her 20s.  She could not be anymore specific than that.   FAMILY HISTORY:  She reports that her parents and siblings use alcohol.  No  other substances.  She has minimal involvement with her family and it is  unknown whether she indeed had any abuse.   PRIMARY CARE  PHYSICIAN:  She does not have one.   MEDICAL PROBLEMS:  She states she has arthritis.   MEDICATIONS:  She is currently prescribed Seroquel 300 mg q.h.s., trazodone  150 mg q.h.s., Prozac 20 mg p.o. q.a.m.  However, she is noncompliant.   ALLERGIES:  No known drug allergies.   PHYSICAL EXAMINATION:  She is 5 feet 7 inches tall.  She weighs 120 pounds.  Temperature is 97.4, blood pressure is 118/82 to 133/94, pulse is 62 to 66  and she states that she has had significant weight loss in the past six  months.  However, she has also been using crack cocaine.  The remainder of  her physical examination is as per the ER.  It is not repeated at this time.   LABORATORY DATA:  She had no worrisome labs.  Her CBC and her chemistries  were within normal limits with the exception of her glucose which was  slightly elevated at 106.   MENTAL STATUS EXAM:  She is drowsy.  She is appropriately groomed, dressed  and nourished.  She has an extensive braiding in her hair.  Her speech is  somewhat slurred.  Her mood is depressed.  Her affect is congruent.  Her  thought processes are not clear.  Judgment and insight are poor.  Concentration and memory are poor.  Intelligence is average to below  average.  She currently denies command hallucinations.  She is passively  suicidal.  She would still hurt herself.   DIAGNOSES:  AXIS I:  Substance abuse, cocaine and marijuana.  Schizoaffective disorder.  Noncompliant with currently prescribed  medications.  AXIS II:  Personality disorder.  AXIS III:  Arthritis.  AXIS IV:  Substance abuse.  AXIS V:  25.   PLAN:  Will admit for safety and stabilization.  We will restart her  currently prescribed medications.  We will also add some Voltaren XR to help  with her arthritis pain and we will help identify outside support groups so  hopefully she will not relapse again.      Mickie Leonarda Salon, P.A.-C.      Anselm Jungling, MD  Electronically  Signed    MD/MEDQ  D:  09/03/2005  T:  09/03/2005  Job:  [id number]

## 2011-03-11 NOTE — H&P (Signed)
NAMEFILIPPA, [name]               ACCOUNT NO.:  [id number]   MEDICAL RECORD NO.:  [id number]          PATIENT TYPE:  IPS   LOCATION:  0303                          FACILITY:  [facility]   PHYSICIAN:  Anselm Jungling, MD  DATE OF BIRTH:  15-May-1956   DATE OF ADMISSION:  01/26/2007  DATE OF DISCHARGE:                       PSYCHIATRIC ADMISSION ASSESSMENT   IDENTIFYING INFORMATION:  This is a 55 year old single African-American  female voluntarily admitted on January 26, 2007.   HISTORY OF PRESENT ILLNESS:  The patient presents with a history of  polysubstance abuse, depression and suicidal thoughts.  He has been  drinking up to half a gallon of liquor.  Also has been using crack  cocaine and marijuana.  There is no history of any seizures or  blackouts.   PAST PSYCHIATRIC HISTORY:  The patient has had a few admissions to  [facility].  She was here last in June of 2007 for psychotic  symptoms and substance abuse.  No current outpatient mental health  treatment.   SOCIAL HISTORY:  This is a 55 year old African-American female, has four  children.  The patient has a 10th grade education.   FAMILY HISTORY:  Her siblings are involved in alcohol and drug use.   ALCOHOL/DRUG HISTORY:  The patient smokes and alcohol drinking habits  are described above.   PRIMARY CARE PHYSICIAN:  Unknown.   MEDICAL PROBLEMS:  The patient currently has bibasilar pneumonia.  Was  seen in the emergency room.  Had a chest x-ray.  Was given her initial  dose of Zithromax.  The patient has a chronic cough.  There is no other  apparent health issues at this time.   MEDICATIONS:  Has been in the past on Prozac, Seroquel and albuterol  inhalers.   ALLERGIES:  No known allergies.   PHYSICAL EXAMINATION:  The patient was assessed at [facility].  Prednisone 60 mg, was prescribed a Z-Pak.  She also  received 40 mEq of K-Dur for a potassium of 3.2.  Her temperature is  97.8, heart rate  98, respirations 26, blood pressure 126/83.  She was  90% saturated.  She is 5 feet 7 inches tall and 121 pounds.   LABORATORY DATA:  Her CBC was within normal limits.  Her potassium is  3.2.  Urine pregnancy test is negative.  Drug screen was positive for  cocaine, positive for opiates.  Her alcohol level was 17.  Urinalysis  shows large but her strep screen was negative.  Her protein on urine was  30.  Her chest x-ray on January 26, 2007 showed bibasilar pneumonia.   Other physical findings include this is a thin, weak-appearing middle-  aged female currently lying in bed, has a mask on her face, having a  productive cough.   MENTAL STATUS EXAM:  She is in the bed.  Sleepy.  Middle-aged female.  The patient has a mask on, coughing with a history of pneumonia.  Speech  is soft-spoken, providing few responses.  The patient's mood is tired  and sick.  The patient again appears weak but in no acute  respiratory  stress at this time.  Thought processes:  There is no apparent delusions  or hallucinations.  Cognitive function:  The patient seems aware of  herself and situation.  She is a poor historian.   DIAGNOSES:  AXIS I:  Mood disorder.  Rule out schizoaffective disorder.  Polysubstance abuse.  AXIS II:  Deferred.  AXIS III:  Pneumonia.  AXIS IV:  Other psychosocial problems, medical problems, possible other  problems with housing and support.  AXIS V:  Current 35.   PLAN:  Contract for safety.  Stabilize mood and thinking.  Will work on  relapse prevention.  Will monitor respiratory system.  Will check a  pulse ox.  Continue with her Z-Pak.  Will continue with her nebulizer  treatments.  Will reinforce medication compliance.  Casemanager is to  assess her living situation and potential rehab and follow-up.   TENTATIVE LENGTH OF STAY:  Five to six days.      Landry Corporal, N.P.      Anselm Jungling, MD  Electronically Signed    JO/MEDQ  D:  01/29/2007  T:  01/29/2007   Job:  [id number]

## 2011-03-11 NOTE — Discharge Summary (Signed)
NAMEKEYONTA, MADRID NO.:  [id number]   MEDICAL RECORD NO.:  [id number]          PATIENT TYPE:  IPS   LOCATION:  0407                          FACILITY:  [facility]   PHYSICIAN:  Jeanice Lim, M.D. DATE OF BIRTH:  Dec 02, 1955   DATE OF ADMISSION:  09/03/2005  DATE OF DISCHARGE:  09/13/2005                                 DISCHARGE SUMMARY   IDENTIFYING DATA:  This is a 55 year old African-American female, presenting  to [facility] yesterday requesting help for  depression and relapse on substances, sent to the emergency room for medical  clearance, reporting suicidal ideation with unspecified auditory and visual  hallucinations.  Had been using alcohol and cocaine since last discharge 2  years ago.  Specific situational stressor unable to identify at this time.  Reports that she hates her life and wants to go to sleep and never work up,  hears voices calling her name, and that she has also self-mutilated in the  past by burning herself.  Unable to contract for safety.   PAST PSYCHIATRIC HISTORY:  Last here in 2004, two inpatient admissions at  that time.  Had been followed up outpatient since 1998 at [facility].   MEDICATIONS:  Prescribed Seroquel, trazodone and Prozac, but the patient  admits to noncompliance with medications.   ALLERGIES:  No known drug allergies.   PHYSICAL AND NEUROLOGICAL EXAMINATION:  Within normal limits.   ROUTINE ADMISSION LABS:  Within normal limits. Glucose slightly elevated at  106.   MENTAL STATUS EXAM:  Drowsy, appropriately groomed and dressed and  nourished, extensive braiding in her hair.  Speech somewhat slurred.  Mood  depressed, affect congruent, thought process mostly goal directed.  Judgment  and insight quite poor.  Cognition was intact.  The patient denied command  hallucinations and reports passive suicidal ideation, still feeling that she  would hurt herself if not  in the hospital.   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  AXIS II:  Personality disorder not otherwise specified.  AXIS III:  Arthritis.  AXIS IV:  Multiple psychosocial issues and sequelae of polysubstance abuse,  and noncompliance with chronic mental illness treatment.  AXIS V:  25/55.   HOSPITAL COURSE:  The patient was admitted and ordered routine p.r.n.  medications, underwent further monitoring, and was encouraged to participate  in individual, group and milieu therapy.  Medications were reconciled.  The  patient was resumed on Seroquel and Voltaren for arthritis.  Risperdal was  started and Wellbutrin to target depressive symptoms.  Wellbutrin then  stopped and Risperdal optimized, Seroquel adjusted to restore sleep but to  minimize over-sedation.  The patient gradually showed improvement with  restoration of sleep cycle, improvement in thought process, decrease in  paranoia, resolution of voices and suicidal thoughts.  The patient had no  dangerous ideation at the time of discharge, no side effects of medications,  and reported increased insight regarding the importance  of compliance and  motivation to follow up with her aftercare plan,  which she helped  participate in developing.  The patient was discharged after medication  education on:  1.  Darvocet-N 100 every 6 hours p.r.n. pain, given a limited supply.  2.  Risperdal 2 mg q.8 p.m.  3.  Seroquel 100 mg 5 at 8 p.m.  4.  Trazodone 100 mg 8 p.m.  5.  Prozac 20 mg q.a.m.  6.  Voltaren 50 mg q.12 p.r.n. arthritic pain.  7.  Ambien 10 mg q.h.s. p.r.n. insomnia.  8.  Colace 100 mg b.i.d.  9.  Cogentin 1 mg b.i.d.   The patient is to follow up with the [facility] on Thursday, November  30 at 1 p.m. with Dr. Lang Snow.    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  AXIS II:  Personality disorder not otherwise specified.  AXIS III:  Arthritis.  AXIS IV:  Multiple psychosocial and sequelae of polysubstance abuse, and  noncompliance with chronic mental illness treatment.  AXIS V:  Global assessment of functioning on discharge was 55.      Jeanice Lim, M.D.  Electronically Signed     JEM/MEDQ  D:  10/02/2005  T:  10/02/2005  Job:  [id number]

## 2011-03-11 NOTE — Discharge Summary (Signed)
[name], [name]               ACCOUNT NO.:  [id number]   MEDICAL RECORD NO.:  [id number]          PATIENT TYPE:  IPS   LOCATION:  0400                          FACILITY:  [facility]   PHYSICIAN:  Anselm Jungling, MD  DATE OF BIRTH:  1956-09-03   DATE OF ADMISSION:  03/28/2006  DATE OF DISCHARGE:  04/05/2006                                 DISCHARGE SUMMARY   IDENTIFYING DATA AND REASON FOR ADMISSION:  This was one of several [facility]  inpatient admissions for [name], a 55 year old African-American female, who  presented with psychosis and substance abuse.  She was admitted for  detoxification from drug abuse and stabilization of psychotic symptoms, most  likely related to schizoaffective disorder.  Please refer to the admission  note for further details pertaining to the symptoms, circumstances and  history that led to her hospitalization.  She was given an initial Axis I  diagnosis of psychosis NOS, rule out schizoaffective disorder, rule out  substance-induced psychosis, and polysubstance abuse.   MEDICAL AND LABORATORY:  The patient was medically and physically assessed  by the psychiatric nurse practitioner.  She came to [place] with a history of  urinary tract infection and muscle twitching NOS.  She had a physical  therapy evaluation to assess mobility and ataxia.  She was deemed to be in  need of physical therapy.  She was instructed to use a walker for safety.   HOSPITAL COURSE:  The patient was admitted to the adult inpatient  psychiatric service.  She presented as a thin female who in initial  interview was in bed and awake, but flat with latent responses.  She was  fully oriented.  Her verbal responses were minimal.  She offered no specific  complaints.  She made no overtly delusional statements but appear to be  quite guarded.   The patient was treated for her urinary tract infection with Cipro and  completed that course.  She was treated with psychotropic medications  including  Prozac up to 20 mg daily, Seroquel up to 350 mg q.h.s., Ambien at  bedtime as needed for sleep 10 mg, Ativan 1 mg every 6 hours as needed, and  Flexeril 10 mg 1/2 to 1 tablet 3 times daily for back pain.  She was also  given Protonix 40 mg daily for GERD.   The patient remained isolative and reclusive, spending a great deal of time  in bed, remaining guarded.  She reported intermittently feeling unsafe and  entertained suicidal thoughts during her inpatient stay.  However, she was  generally cooperative.   Towards the latter portion of her hospital stay, which spanned 9 days, she  expressed worries that she might not be able to maintain abstinence from  drugs after being discharged.  She described her neighborhood as being drug  infested and was anxious about the prospect of returning there.  She  continued withdrawn and isolative, and engaging in minimal conversation with  Dr. Katrinka Blazing, who cared for her during the latter portion of her hospital stay.   On the 9th hospital day, the patient told Dr. Katrinka Blazing that  she felt like she  would like to be discharged.  She expressed more optimism about her ability  to cope in spite of her negative neighborhood environment.  Dr. Katrinka Blazing noted  that the patient's mental status had improved from her admission status,  being less depressed and anxious.  Her affect was still constricted overall,  but showed somewhat wider range than that upon admission.  The patient  denied thoughts of suicide or self-injurious behavior.  She denied auditory  and visual hallucinations, expressed no delusional ideas, and did not appear  to be experiencing paranoia.  Her thoughts were logical, linear, and goal-  directed.  She was felt to be appropriate for discharge.   AFTERCARE:  The patient was to follow-up at [facility]  with an appointment on June 14, the day following discharge.  She was also  to follow-up with Sheliah Mends at [facility] for an intake  on June 26 for general services.   DISCHARGE MEDICATIONS:  1.  Protonix 40 mg daily.  2.  Prozac 20 mg daily.  3.  Naproxen 500 mg twice daily.  4.  Seroquel 100 mg, 3-1/2 tablets at bedtime.  5.  Ambien 10 mg h.s. p.r.n. insomnia.  6.  Lorazepam 1 mg q.6h. p.r.n. agitation.  7.  Flexeril 5-10 mg t.i.d. for back pain.   DISCHARGE DIAGNOSES:  AXIS I:  Schizoaffective disorder NOS.  AXIS II:  Deferred.  AXIS III:  Gastroesophageal reflux disease, back pain, status post urinary  tract infection.  AXIS IV:  Stressors severe.  AXIS V:  Global assessment of functioning on discharge 60.           ______________________________  Anselm Jungling, MD  Electronically Signed     SPB/MEDQ  D:  04/21/2006  T:  04/21/2006  Job:  [phone] [id number]

## 2011-03-11 NOTE — Discharge Summary (Signed)
[name], [name]               ACCOUNT NO.:  [id number]   MEDICAL RECORD NO.:  [id number]          PATIENT TYPE:  IPS   LOCATION:  0400                          FACILITY:  [facility]   PHYSICIAN:  Jasmine Pang, M.D. DATE OF BIRTH:  1955/11/19   DATE OF ADMISSION:  03/28/2006  DATE OF DISCHARGE:  04/05/2006                                 DISCHARGE SUMMARY   IDENTIFICATION:  This is a 55 year old African-American female who is  single.  This was a voluntary admission on March 28, 2006.   HISTORY OF PRESENT ILLNESS:  This is one of several admissions for this 44-  year-old with a history of schizoaffective disorder, auditory hallucinations  and chronic substance abuse.  She reports that she had stopped all her  medications about two months ago but is not able to offer any specific  reason why.  She relapsed on alcohol and has had escalating use to the point  where she says she was drinking about two cases of beer daily.  She was also  using cocaine daily for at least a couple weeks prior to admission.  She was  initially admitted by way of the emergency room to the internal medicine  unit due to some bizarre motor twitching that was seen in the ED.  On the  medicine service, she was diagnosed with a urinary tract infection and  prescribed Cipro, given potassium for some hypokalemia, and Ativan for  agitation.  She endorsed suicidal ideation, primarily passive, feeling that  life is not worth living.  She has had some auditory hallucinations which  she did not want to elaborate on at the time of admission.   PAST PSYCHIATRIC HISTORY:  This is one of multiple admissions to [facility] for this patient.  She has a diagnosis of  schizoaffective disorder with the additional substance exacerbation of  psychosis, and she also has a history of alcohol dependence and cocaine  dependence and personality disorder, NOS.  She was last here in December of  2006, at which  time she was stabilized on 2 mg of Risperdal and about 300 mg  of Seroquel daily and Prozac 20 mg daily.  Her previous outpatient follow-up  is unclear.   ADMISSION DIAGNOSIS:  Schizoaffective disorder with rule out substance-  induced psychosis.  Alcohol dependence and cocaine dependence, as well as  personality disorder, not otherwise specified.  For further admission  information, see dictated psychiatric admission assessment in the chart.   PHYSICAL EXAMINATION:  Physical exam was done in the medicine service at  [facility] during her inpatient stay there.  She was in no acute  physical distress.  No acute distress when sent to our unit.   ADMISSION LABORATORY:  CBC was grossly within normal limits.  Basic  metabolic panel was grossly within normal limits except for an elevated  glucose of 117.  Hepatic panel was within normal limits.  Cardiac enzymes  were unremarkable.  UDS was positive for cocaine.  Urinalysis revealed some  blood and ketones (the patient has a urinary tract infection that  is  currently being treated with Cipro), and then the TSH was within normal  limits.   HOSPITAL COURSE:  Upon admission, the patient was started on Ativan 1 mg  p.o. q.6 hours p.r.n. agitation or withdrawal symptoms and Seroquel 100 mg  p.o. q.h.s. and 50 mg q.6 hours p.r.n. agitation.  On March 28, 2006, the  patient was started on Protonix 40 mg daily.  Cipro 500 mg p.o. b.i.d. was  continued to stop on March 31, 2006.  On March 28, 2006, the patient was also  given a walker for her unsteady gait.  On March 29, 2006, Prozac 10 mg daily  was started.  Seroquel was increased to 200 mg p.o. q.h.s. and 50 mg t.i.d.  p.r.n. agitation.  On April 01, 2006, Prozac was increased to 20 mg daily.  Seroquel was increased to 300 mg p.o. q.h.s. and 50 mg p.o. t.i.d. p.r.n.  agitation.  On April 01, 2006, the patient was also begun on Flexeril 5 mg one  to two tablets p.o. t.i.d. p.r.n. back pain.  On April 02, 2006, the patient  was started on Aleve 500 mg p.o. b.i.d. for back pain, and her Seroquel was  also increased to 350 mg p.o. q.h.s., with the continuation of the 50 mg  p.o. t.i.d. p.r.n. agitation.  The patient tolerated these medications well  with no significant side effects other than some sedation.   The patient met with her doctor, who was originally Dr. Electa Sniff, for an  initial assessment on March 29, 2006.On March 30, 2006, the patient was in bed.  She stated her back had been bothering her.  She slept restlessly.  She was  eating meals.  She was not participating in groups.  She had no complaints  referable to meds.  There were no overtly delusional statements.  She denied  auditory hallucinations but was very guarded overall.  On March 31, 2006, the  patient continued to spend virtually all her time in bed complaining of back  pain.  There was no significant change in mental status.  She requests pain  medication but was told by Dr. Electa Sniff that this was could not be prescribed  due to her chemical dependence issues.  She did not appear psychotic but  appeared more depressed.  On June 8 and April 01, 2006, through the weekend,  the patient continued to have a somewhat guarded affect and mood with  intermittent thoughts of suicide.  She continued to have nightmares of drug  use and violence, with frequent awakenings at night.  Her mood was guarded  and depressed.  It was positive suicidal ideation.  She continued to have  low back pain.   On April 04, 2006, I took over the patient's care in Dr. Barrett Shell absence.  She was feeling somewhat better, and we discussed the possibility of  discharge.  She was worried that she may not be able to maintain her  sobriety if she went home.  She described her neighborhood as drug-infested  and was quite frightened at the prospect of returning home.  She was withdrawn and isolative with poor eye contact.  On April 05, 2006, the  patient changed  her mind and stated she felt like she would like to go home  today.  She felt more optimistic about her ability to cope in spite of her  negative neighborhood.  Mental status was improved from her admission  status.  Mood was less depressed and anxious.  Affect still constricted but  wider range.  No suicidal or homicidal ideation.  No self-injurious  behavior.  No auditory or visual hallucinations or paranoia.  Thoughts are  logical, linear, and goal-directed.  Cognitive grossly within normal limits.  The patient will be discharged and return to [facility] for follow-up.   DISCHARGE DIAGNOSES:  AXIS I:  Schizoaffective disorder, depressed mood,  substance-induced psychosis, polysubstance dependence.  AXIS II:  Personality disorder, not otherwise specified.  AXIS III:  Urinary tract infection, resolved with treatment with Cipro.  AXIS IV:  Severe (psychosocial problems, psychosocial issues).  AXIS V:  Global Assessment of Functioning upon discharge was 40, Global  Assessment of Functioning upon admission was 25, Global Assessment of  Functioning highest past year was 59.   DISCHARGE PLANS:  There were no specific dietary restrictions.  Activity  level will be limited by her back pain and should continue to be assessed by  her doctor.   DISCHARGE MEDICATIONS:  Protonix 40 mg p.o. daily, fluoxetine/HCL 20 mg  daily, naproxen 500 mg p.o. b.i.d., Seroquel 350 mg at bedtime, Ambien 10 mg  at bedtime, lorazepam 1 mg every six hours as needed for anxiety, Flexeril  10 mg one-half to one pill three times daily for back pain.   POST-HOSPITAL CARE PLAN:  The patient will be followed at the [facility] for medication management on June 14 at 9:30  a.m.  She will see Stephens Shire for case management on June 26 at 2 p.m.      Jasmine Pang, M.D.  Electronically Signed     BHS/MEDQ  D:  04/11/2006  T:  04/12/2006  Job:  [id number]

## 2011-03-11 NOTE — H&P (Signed)
NAME:  [name], [name]                         ACCOUNT NO.:  [id number]   MEDICAL RECORD NO.:  [id number]                   PATIENT TYPE:  IPS   LOCATION:  0406                                 [facility]ACILITY:  [facility]   PHYSICIAN:  Geoffery Lyons, M.D.                   DATE OF BIRTH:  1956-08-14   DATE OF ADMISSION:  06/27/2004  DATE OF DISCHARGE:                         PSYCHIATRIC ADMISSION ASSESSMENT   IDENTIFYING INFORMATION:  This is a voluntary admission.  This is a 55-year-  old African-American female who is currently homeless, and she is also  single.  She presented to the emergency department yesterday because of ear  pain.  She confided to an RN that she had suicidal ideation with a gesture  of attempting to stab herself.  She began using drugs due to the deaths of  fiances, two of them, and then her mother.  She wants to be in her own place  and be happy.  Apparently she was recently evicted and has been living as  best she can since June wherever.   PAST PSYCHIATRIC HISTORY:  She has had several prior [facility] admissions, the last being in May of 2004.  She acknowledges being  followed at [facility] by Dr. Hortencia Pilar and Madaline Savage.  She has missed several appointments.  She was last seen in May of  2005.   SOCIAL HISTORY:  She finished the 11th grade.  She has never married.  She  has 2 daughters, ages 47 and 71.  She is disabled.  She gets an SSDI check  for $500 for her back and mental health.   FAMILY HISTORY:  She denies.   ALCOHOL AND DRUG ABUSE:  She does not commit to when she actually started  using, but she uses cocaine and alcohol.   CURRENT MEDICATIONS:  She has been prescribed Prozac 20 mg and Seroquel 200  mg at h.s. by Dr. Hortencia Pilar.  This was confirmed at [facility] on [address]  and they were last filled July 27.   ALLERGIES:  No known drug allergies.   POSITIVE PHYSICAL FINDINGS:  PHYSICAL EXAMINATION:  Physical  examination was  not repeated.  It was documented in the ER.  She is thin.  She did report  having had many sexual partners and an sexually-transmitted disease workup  is in progress.   MENTAL STATUS EXAM:  She is alert and oriented, although she is drowsy.  Her  appearance and behavior:  She is disheveled.  She is cooperative.  Her motor  is normal.  She has fair eye contact.  Her speech is a normal rate, rhythm  and tone, her mood is depressed, her affect is sad.  Her thought processes  are clear, rational, and goal oriented.  She would like a place of her own  to live.  There were no delusions or paranoia.  Concentration  and memory are  intact.  Judgment and insight are fair.  Her intelligence is average.  She  denies suicidal or homicidal ideation.  She denies auditory or visual  hallucinations.   ADMISSION DIAGNOSES:   AXIS I:  1.  Substance-induced mood disorder.  2.  Cocaine dependence.   AXIS II:  Deferred.   AXIS III:  History of headache and arthritis.   AXIS IV:  Homeless.   AXIS V:  Global assessment of function is 30.   PLAN:  The plan is to admit for stabilization and support through  withdrawal.  Case manager to help with post-discharge housing, and we will  re institute compliance with medications.     Mickie Leonarda Salon, P.A.-C.               Geoffery Lyons, M.D.    MD/MEDQ  D:  06/28/2004  T:  06/28/2004  Job:  [id number]
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
## 2011-03-11 NOTE — Discharge Summary (Signed)
NAMEJINELLE, [name] NO.:  [id number]   MEDICAL RECORD NO.:  [id number]          PATIENT TYPE:  IPS   LOCATION:  0405                          FACILITY:  [facility]   PHYSICIAN:  Jeanice Lim, M.D. DATE OF BIRTH:  [birth year]   DATE OF ADMISSION:  06/27/2004  DATE OF DISCHARGE:  07/07/2004                                 DISCHARGE SUMMARY   IDENTIFYING DATA:  This is a 55 year old, African-American female, currently  homeless, single who presents to the emergency room because of ear pain.  Had suicidal ideation/attempt/gesture of stabbing, attempting to stab  herself.   PAST PSYCHIATRIC HISTORY:  Several admissions to [facility] in the past.  The last  May of 2004.  Follow-up in [facility] with Dr. Hortencia Pilar.   MEDICATIONS:  Currently Prozac and Seroquel.   ALLERGIES:  No known drug allergies.   PHYSICAL EXAMINATION:  Physical exam and neurologic exam within normal  limits.   LABORATORY DATA:  Routine admission labs within normal limits.   MENTAL STATUS EXAM:  Alert, oriented, cooperative.  Mood was depressed.  Thought processes goal directed and no psychotic symptoms.  Cognition  intact.  Judgment and insight fair to limited.   ADMITTING DIAGNOSES:   AXIS I:  1.  Substance induced mood disorder.  2.  Cocaine dependence.   AXIS II:  Deferred.   AXIS III:  History of headache and arthritis.   AXIS IV:  Homelessness.   AXIS V:  Global Assessment of Functioning was 30/50.   HOSPITAL COURSE:  The patient was admitted and ordered routine p.r.n.  medications and underwent further observation.  Was encouraged to  participate in individual, group and milieu therapies.  The patient was  medically monitored, placed on low dose Librium, received detox and resumed  on psychotropics, including Prozac, Seroquel and Risperdal.  The patient's  medications were adjusted and decreased to minimal effective doses to limit  side effects.  The patient reported positive  response and was discharged in  improved condition with no dangerous ideation or psychotic symptoms.   DISCHARGE MEDICATIONS:  Given medication education and discharged on:  1.  Flexeril 10 mg q.h.s.  2.  Trazodone 150 mg q.9:00 p.m.  3.  Seroquel 300 mg q.9:00 p.m.  4.  Flexeril 10 mg q.9:00 p.m.  5.  Ambien 10 mg q.h.s.  6.  Symmetrel 100 mg b.i.d.  7.  Colace 100 mg q.a.m.  8.  Nasonex 50 mcg one to two q. day.  9.  Cogentin 0.5 mg b.i.d.  10. Risperdal 2 mg q.9:00 p.m.   FOLLOW UP:  The patient will follow-up in [facility] with Dr. Joni Reining on September 15 at 1:00 p.m.   DISCHARGE DIAGNOSES:   AXIS I:  1.  Substance induced mood disorder.  2.  Cocaine dependence.   AXIS II:  Deferred.   AXIS III:  History of headache and arthritis.   AXIS IV:  Homelessness.   AXIS V:  Global Assessment of Functioning on discharge was 50 to 55.     Jame   JEM/MEDQ  D:  08/05/2004  T:  08/06/2004  Job:  [id number]

## 2011-04-13 ENCOUNTER — Other Ambulatory Visit: Payer: Self-pay | Admitting: Family Medicine

## 2011-04-13 ENCOUNTER — Other Ambulatory Visit (HOSPITAL_COMMUNITY)
Admission: RE | Admit: 2011-04-13 | Discharge: 2011-04-13 | Disposition: A | Discharge status: Home / Self Care | Payer: Medicare Other | Source: Ambulatory Visit | Attending: Family Medicine | Admitting: Family Medicine

## 2011-04-13 ENCOUNTER — Other Ambulatory Visit (HOSPITAL_COMMUNITY)
Admission: RE | Admit: 2011-04-13 | Discharge: 2011-04-13 | Disposition: A | Discharge status: Home / Self Care | Payer: Medicaid Other | Source: Ambulatory Visit | Attending: Family Medicine | Admitting: Family Medicine

## 2011-04-13 DIAGNOSIS — R87810 Cervical high risk human papillomavirus (HPV) DNA test positive: Secondary | ICD-10-CM | POA: Insufficient documentation

## 2011-04-13 DIAGNOSIS — Z01419 Encounter for gynecological examination (general) (routine) without abnormal findings: Secondary | ICD-10-CM | POA: Insufficient documentation

## 2011-06-27 ENCOUNTER — Emergency Department (HOSPITAL_COMMUNITY)
Admission: EM | Admit: 2011-06-27 | Discharge: 2011-06-27 | Disposition: A | Discharge status: Home / Self Care | Payer: Medicare Other | Attending: Emergency Medicine | Admitting: Emergency Medicine

## 2011-06-27 ENCOUNTER — Emergency Department (HOSPITAL_COMMUNITY): Payer: Medicare Other

## 2011-06-27 DIAGNOSIS — N390 Urinary tract infection, site not specified: Secondary | ICD-10-CM | POA: Insufficient documentation

## 2011-06-27 DIAGNOSIS — K5730 Diverticulosis of large intestine without perforation or abscess without bleeding: Secondary | ICD-10-CM | POA: Insufficient documentation

## 2011-06-27 DIAGNOSIS — D3500 Benign neoplasm of unspecified adrenal gland: Secondary | ICD-10-CM | POA: Insufficient documentation

## 2011-06-27 DIAGNOSIS — R197 Diarrhea, unspecified: Secondary | ICD-10-CM | POA: Insufficient documentation

## 2011-06-27 DIAGNOSIS — R112 Nausea with vomiting, unspecified: Secondary | ICD-10-CM | POA: Insufficient documentation

## 2011-06-27 DIAGNOSIS — R109 Unspecified abdominal pain: Secondary | ICD-10-CM | POA: Insufficient documentation

## 2011-06-27 LAB — COMPREHENSIVE METABOLIC PANEL
ALT: 17 U/L (ref 0–35)
BUN: 12 mg/dL (ref 6–23)
CO2: 29 mEq/L (ref 19–32)
Calcium: 9 mg/dL (ref 8.4–10.5)
Creatinine, Ser: 0.84 mg/dL (ref 0.50–1.10)
GFR calc Af Amer: >60 mL/min (ref >60)
GFR calc non Af Amer: >60 mL/min (ref >60)
Glucose, Bld: 98 mg/dL (ref 70–99)
Sodium: 140 mEq/L (ref 135–145)
Total Protein: 6.6 g/dL (ref 6.0–8.3)

## 2011-06-27 LAB — URINE MICROSCOPIC-ADD ON

## 2011-06-27 LAB — URINALYSIS, ROUTINE W REFLEX MICROSCOPIC
Nitrite: NEGATIVE
Protein, ur: 100 mg/dL — AB
Specific Gravity, Urine: 1.021 (ref 1.005–1.030)
Urobilinogen, UA: 1 mg/dL (ref 0.0–1.0)

## 2011-06-27 LAB — CBC
HCT: 40.2 % (ref 36.0–46.0)
MCHC: 33.6 g/dL (ref 30.0–36.0)
Platelets: 220 10*3/uL (ref 150–400)
RDW: 13.6 % (ref 11.5–15.5)
WBC: 7.4 10*3/uL (ref 4.0–10.5)

## 2011-06-27 LAB — DIFFERENTIAL
Basophils Absolute: 0 10*3/uL (ref 0.0–0.1)
Basophils Relative: 0 % (ref 0–1)
Eosinophils Relative: 3 % (ref 0–5)
Lymphocytes Relative: 41 % (ref 12–46)
Monocytes Absolute: 0.6 10*3/uL (ref 0.1–1.0)

## 2011-06-27 LAB — LIPASE, BLOOD: Lipase: 57 U/L (ref 11–59)

## 2011-06-27 MED ORDER — IOHEXOL 300 MG/ML  SOLN
100.0000 mL | Freq: Once | INTRAMUSCULAR | Status: AC | PRN
  Administered 2011-06-27: 100 mL via INTRAVENOUS

## 2011-08-05 LAB — BASIC METABOLIC PANEL
CO2: 24
Chloride: 100
GFR calc Af Amer: >60
Potassium: 3.6

## 2011-08-05 LAB — CBC
HCT: 37.7
MCHC: 33.8
MCV: 84.5
RBC: 4.45
WBC: 11 — ABNORMAL HIGH

## 2011-08-05 LAB — DIFFERENTIAL
Eosinophils Absolute: 0.3
Eosinophils Relative: 3
Lymphs Abs: 2.2
Monocytes Absolute: 0.8 — ABNORMAL HIGH
Monocytes Relative: 7

## 2011-08-05 LAB — HEPATIC FUNCTION PANEL
ALT: 15
Bilirubin, Direct: <0.1
Total Protein: 5.8 — ABNORMAL LOW

## 2011-08-05 LAB — TSH: TSH: 0.463

## 2011-08-05 LAB — RAPID URINE DRUG SCREEN, HOSP PERFORMED
Barbiturates: NOT DETECTED
Cocaine: POSITIVE — AB

## 2011-08-05 LAB — ETHANOL: Alcohol, Ethyl (B): 37 — ABNORMAL HIGH

## 2011-10-10 ENCOUNTER — Encounter: Payer: Self-pay | Admitting: Emergency Medicine

## 2011-10-10 ENCOUNTER — Emergency Department (HOSPITAL_COMMUNITY)
Admission: EM | Admit: 2011-10-10 | Discharge: 2011-10-10 | Disposition: A | Discharge status: Home / Self Care | Payer: Medicare Other | Attending: Emergency Medicine | Admitting: Emergency Medicine

## 2011-10-10 DIAGNOSIS — K5732 Diverticulitis of large intestine without perforation or abscess without bleeding: Secondary | ICD-10-CM | POA: Insufficient documentation

## 2011-10-10 DIAGNOSIS — R109 Unspecified abdominal pain: Secondary | ICD-10-CM | POA: Insufficient documentation

## 2011-10-10 DIAGNOSIS — K625 Hemorrhage of anus and rectum: Secondary | ICD-10-CM | POA: Insufficient documentation

## 2011-10-10 DIAGNOSIS — F17200 Nicotine dependence, unspecified, uncomplicated: Secondary | ICD-10-CM | POA: Insufficient documentation

## 2011-10-10 DIAGNOSIS — K5792 Diverticulitis of intestine, part unspecified, without perforation or abscess without bleeding: Secondary | ICD-10-CM

## 2011-10-10 DIAGNOSIS — F329 Major depressive disorder, single episode, unspecified: Secondary | ICD-10-CM | POA: Insufficient documentation

## 2011-10-10 HISTORY — DX: Major depressive disorder, single episode, unspecified: F32.9

## 2011-10-10 HISTORY — DX: Depression, unspecified: F32.A

## 2011-10-10 LAB — COMPREHENSIVE METABOLIC PANEL
ALT: 14 U/L (ref 0–35)
AST: 16 U/L (ref 0–37)
CO2: 26 mEq/L (ref 19–32)
Chloride: 103 mEq/L (ref 96–112)
Creatinine, Ser: 0.81 mg/dL (ref 0.50–1.10)
GFR calc Af Amer: >90 mL/min (ref >90)
GFR calc non Af Amer: 80 mL/min — ABNORMAL LOW (ref >90)
Glucose, Bld: 101 mg/dL — ABNORMAL HIGH (ref 70–99)
Sodium: 139 mEq/L (ref 135–145)
Total Bilirubin: 0.4 mg/dL (ref 0.3–1.2)

## 2011-10-10 LAB — URINALYSIS, ROUTINE W REFLEX MICROSCOPIC
Glucose, UA: NEGATIVE mg/dL
Nitrite: NEGATIVE
Specific Gravity, Urine: 1.029 (ref 1.005–1.030)
pH: 5.5 (ref 5.0–8.0)

## 2011-10-10 LAB — DIFFERENTIAL
Basophils Absolute: 0 10*3/uL (ref 0.0–0.1)
Basophils Relative: 0 % (ref 0–1)
Eosinophils Absolute: 0.2 10*3/uL (ref 0.0–0.7)
Eosinophils Relative: 3 % (ref 0–5)
Lymphocytes Relative: 44 % (ref 12–46)
Lymphs Abs: 3.6 10*3/uL (ref 0.7–4.0)
Monocytes Absolute: 0.6 10*3/uL (ref 0.1–1.0)
Monocytes Relative: 7 % (ref 3–12)
Neutro Abs: 3.7 10*3/uL (ref 1.7–7.7)
Neutrophils Relative %: 46 % (ref 43–77)

## 2011-10-10 LAB — URINE MICROSCOPIC-ADD ON

## 2011-10-10 LAB — CBC
MCV: 87.6 fL (ref 78.0–100.0)
Platelets: 238 10*3/uL (ref 150–400)
RDW: 13.1 % (ref 11.5–15.5)
WBC: 8.1 10*3/uL (ref 4.0–10.5)

## 2011-10-10 MED ORDER — CIPROFLOXACIN HCL 500 MG PO TABS: 500.0000 mg | ORAL_TABLET | Freq: Two times a day (BID) | ORAL | Status: AC

## 2011-10-10 MED ORDER — METRONIDAZOLE 500 MG PO TABS: 500.0000 mg | ORAL_TABLET | Freq: Two times a day (BID) | ORAL | Status: AC

## 2011-10-10 NOTE — ED Notes (Signed)
Pt c/o lower abd pain with 1 episode of bloody stool yesteray; pt sts recently treated for UTI and denies urinary burning

## 2011-10-10 NOTE — ED Notes (Signed)
Pt ambulated with a steady gait; VSS; no signs of distress; A&Ox3; respirations even and unlabored; skin warm and dry. Pt reports she has no questions at this time and will follow d/c instructions and take prescriptions as prescribed.

## 2011-10-10 NOTE — ED Notes (Signed)
C/o intermitttent lower abd pain, urinary frequency x 2-3 days. Denies dysuria. Also states had one bloody stool yesterday, normal BM today. Denies n/v/d. C/o feeling bloated & "full".

## 2011-10-10 NOTE — ED Provider Notes (Signed)
History     CSN: [id number] Arrival date & time: 10/10/2011 12:23 PM   First MD Initiated Contact with Patient 10/10/11 1642      Chief Complaint  Patient presents with  . Abdominal Pain  . Rectal Bleeding    (Consider location/radiation/quality/duration/timing/severity/associated sxs/prior treatment) HPI Comments: States started with lower abd pain, blood-streaked stool last night.  Denies fevers or chills.  Had colonoscopy and ct scan about two months ago.  She believes they were okay.  Patient is a 55 y.o. female presenting with abdominal pain. The history is provided by the patient.  Abdominal Pain The primary symptoms of the illness include abdominal pain. The primary symptoms of the illness do not include fever, fatigue, nausea, vomiting, diarrhea or dysuria. The current episode started yesterday. The onset of the illness was sudden. The problem has been gradually worsening.  The patient has not had a change in bowel habit. Symptoms associated with the illness do not include chills, constipation, urgency, hematuria, frequency or back pain.    Past Medical History  Diagnosis Date  . Depression     History reviewed. No pertinent past surgical history.  History reviewed. No pertinent family history.  History  Substance Use Topics  . Smoking status: Current Everyday Smoker  . Smokeless tobacco: Not on file  . Alcohol Use: Yes    OB History    Grav Para Term Preterm Abortions TAB SAB Ect Mult Living                  Review of Systems  Constitutional: Negative for fever, chills and fatigue.  Gastrointestinal: Positive for abdominal pain. Negative for nausea, vomiting, diarrhea and constipation.  Genitourinary: Negative for dysuria, urgency, frequency and hematuria.  Musculoskeletal: Negative for back pain.  All other systems reviewed and are negative.    Allergies  Review of patient's allergies indicates no known allergies.  Home Medications   Current  Outpatient Rx  Name Route Sig Dispense Refill  . FLUOXETINE HCL 20 MG PO CAPS Oral Take 20 mg by mouth daily.      . TRAZODONE HCL 50 MG PO TABS Oral Take 50 mg by mouth at bedtime.        BP 141/78  Pulse 114  Temp(Src) 98 F (36.7 C) (Oral)  Resp 18  SpO2 100%  Physical Exam  Nursing note and vitals reviewed. Constitutional: She is oriented to person, place, and time. She appears well-developed and well-nourished. No distress.  HENT:  Head: Normocephalic and atraumatic.  Neck: Normal range of motion. Neck supple.  Cardiovascular: Normal rate and regular rhythm.  Exam reveals no gallop and no friction rub.   No murmur heard. Pulmonary/Chest: Effort normal and breath sounds normal. No respiratory distress. She has no wheezes.  Abdominal: Soft. Bowel sounds are normal. She exhibits no distension. There is no tenderness.  Musculoskeletal: Normal range of motion.  Neurological: She is alert and oriented to person, place, and time.  Skin: Skin is warm and dry. She is not diaphoretic.    ED Course  Procedures (including critical care time)  Labs Reviewed  URINALYSIS, ROUTINE W REFLEX MICROSCOPIC - Abnormal; Notable for the following:    Color, Urine AMBER (*) BIOCHEMICALS MAY BE AFFECTED BY COLOR   Hgb urine dipstick LARGE (*)    Bilirubin Urine SMALL (*)    Protein, ur 100 (*)    All other components within normal limits  URINE MICROSCOPIC-ADD ON - Abnormal; Notable for the following:  Squamous Epithelial / LPF FEW (*)    All other components within normal limits   No results found.   No diagnosis found.    MDM  No wbc.  I suspect diverticulitis.  Will treat with cipro/flagyl, return prn.          Geoffery Lyons, MD 10/10/11 2022

## 2011-12-08 ENCOUNTER — Emergency Department (HOSPITAL_COMMUNITY)
Admission: EM | Admit: 2011-12-08 | Discharge: 2011-12-08 | Disposition: A | Discharge status: Home / Self Care | Payer: Medicare Other | Attending: Emergency Medicine | Admitting: Emergency Medicine

## 2011-12-08 ENCOUNTER — Encounter (HOSPITAL_COMMUNITY): Payer: Self-pay | Admitting: Emergency Medicine

## 2011-12-08 ENCOUNTER — Other Ambulatory Visit: Payer: Self-pay

## 2011-12-08 ENCOUNTER — Emergency Department (HOSPITAL_COMMUNITY): Payer: Medicare Other

## 2011-12-08 DIAGNOSIS — R05 Cough: Secondary | ICD-10-CM | POA: Insufficient documentation

## 2011-12-08 DIAGNOSIS — G8929 Other chronic pain: Secondary | ICD-10-CM | POA: Insufficient documentation

## 2011-12-08 DIAGNOSIS — R109 Unspecified abdominal pain: Secondary | ICD-10-CM | POA: Insufficient documentation

## 2011-12-08 DIAGNOSIS — M549 Dorsalgia, unspecified: Secondary | ICD-10-CM

## 2011-12-08 DIAGNOSIS — R1013 Epigastric pain: Secondary | ICD-10-CM | POA: Insufficient documentation

## 2011-12-08 DIAGNOSIS — R079 Chest pain, unspecified: Secondary | ICD-10-CM | POA: Insufficient documentation

## 2011-12-08 DIAGNOSIS — R059 Cough, unspecified: Secondary | ICD-10-CM | POA: Insufficient documentation

## 2011-12-08 DIAGNOSIS — M545 Low back pain, unspecified: Secondary | ICD-10-CM | POA: Insufficient documentation

## 2011-12-08 DIAGNOSIS — F17200 Nicotine dependence, unspecified, uncomplicated: Secondary | ICD-10-CM | POA: Insufficient documentation

## 2011-12-08 LAB — DIFFERENTIAL
Basophils Relative: 0 % (ref 0–1)
Lymphocytes Relative: 35 % (ref 12–46)
Lymphs Abs: 2.6 10*3/uL (ref 0.7–4.0)
Monocytes Relative: 10 % (ref 3–12)
Neutro Abs: 3.8 10*3/uL (ref 1.7–7.7)
Neutrophils Relative %: 52 % (ref 43–77)

## 2011-12-08 LAB — LIPASE, BLOOD: Lipase: 53 U/L (ref 11–59)

## 2011-12-08 LAB — CBC
Hemoglobin: 13.2 g/dL (ref 12.0–15.0)
MCHC: 32.8 g/dL (ref 30.0–36.0)
RBC: 4.6 MIL/uL (ref 3.87–5.11)
WBC: 7.3 10*3/uL (ref 4.0–10.5)

## 2011-12-08 LAB — COMPREHENSIVE METABOLIC PANEL
Albumin: 3.3 g/dL — ABNORMAL LOW (ref 3.5–5.2)
Alkaline Phosphatase: 86 U/L (ref 39–117)
BUN: 12 mg/dL (ref 6–23)
Chloride: 105 mEq/L (ref 96–112)
Potassium: 3.5 mEq/L (ref 3.5–5.1)
Total Bilirubin: 0.4 mg/dL (ref 0.3–1.2)

## 2011-12-08 LAB — URINALYSIS, ROUTINE W REFLEX MICROSCOPIC
Bilirubin Urine: NEGATIVE
Protein, ur: 30 mg/dL — AB
Urobilinogen, UA: 0.2 mg/dL (ref 0.0–1.0)

## 2011-12-08 LAB — URINE MICROSCOPIC-ADD ON

## 2011-12-08 MED ORDER — TRAMADOL HCL 50 MG PO TABS
50.0000 mg | ORAL_TABLET | Freq: Once | ORAL | Status: AC
  Administered 2011-12-08: 50 mg via ORAL
  Filled 2011-12-08: qty 1

## 2011-12-08 MED ORDER — SODIUM CHLORIDE 0.9 % IV SOLN
Freq: Once | INTRAVENOUS | Status: AC
  Administered 2011-12-08: 15:00:00 via INTRAVENOUS

## 2011-12-08 MED ORDER — HYDROMORPHONE HCL PF 1 MG/ML IJ SOLN
0.5000 mg | Freq: Once | INTRAMUSCULAR | Status: AC
  Administered 2011-12-08: 0.5 mg via INTRAVENOUS
  Filled 2011-12-08: qty 1

## 2011-12-08 MED ORDER — ONDANSETRON HCL 4 MG/2ML IJ SOLN
4.0000 mg | Freq: Once | INTRAMUSCULAR | Status: AC
  Administered 2011-12-08: 4 mg via INTRAVENOUS
  Filled 2011-12-08: qty 2

## 2011-12-08 MED ORDER — TRAMADOL HCL 50 MG PO TABS: 50.0000 mg | ORAL_TABLET | Freq: Four times a day (QID) | ORAL | Status: AC | PRN

## 2011-12-08 NOTE — ED Notes (Signed)
Pt here c/o lower back pain starting yesterday and midsternal CP starting yesterday and lower abd pain starting yesterday; pt is vague about sx; pt denies vaginal discharge or burning with urination; pt denies SOB; pt denies obvious injury

## 2011-12-08 NOTE — Discharge Instructions (Signed)
Today her EKG and cardiac markers are all normal.  Urine is normal, showing no sign of infarction.  He had an exacerbation of your chronic low back pain.  Please make an appointment with your primary care physician for further evaluation and a long-range plan for treatment

## 2011-12-08 NOTE — ED Provider Notes (Signed)
Medical screening examination/treatment/procedure(s) were performed by non-physician practitioner and as supervising physician I was immediately available for consultation/collaboration.  Ethelda Chick, MD 12/08/11 2156

## 2011-12-08 NOTE — ED Provider Notes (Signed)
History     CSN: [id number]  Arrival date & time 12/08/11  1128   First MD Initiated Contact with Patient 12/08/11 1404      Chief Complaint  Patient presents with  . Back Pain  . Chest Pain  . Abdominal Pain    (Consider location/radiation/quality/duration/timing/severity/associated sxs/prior treatment) HPI Comments: Patient has chronic back pain is on disability for same.  Developed increasing pain yesterday.  She states she woke with this pain.  Denies dysuria, constipation, new injury. Yesterday evening developed some vague chest pain without nausea, vomiting, diaphoresis, shortness of breath, but reports, that she's had a cough for the past 10 days, she also developed some suprapubic pain without dysuria, yesterday.   Patient is a 56 y.o. female presenting with back pain, chest pain, and abdominal pain. The history is provided by the patient.  Back Pain  This is a recurrent problem. The current episode started yesterday. The problem occurs constantly. The problem has not changed since onset.The pain is associated with no known injury. The pain is present in the lumbar spine. The quality of the pain is described as aching. The pain does not radiate. The pain is at a severity of 7/10. The pain is moderate. The symptoms are aggravated by twisting and certain positions. Associated symptoms include chest pain and abdominal pain. Pertinent negatives include no fever, no numbness, no abdominal swelling, no dysuria, no pelvic pain, no leg pain, no paresis and no weakness.  Chest Pain Primary symptoms include cough and abdominal pain. Pertinent negatives for primary symptoms include no fever, no shortness of breath, no nausea and no vomiting.  Pertinent negatives for associated symptoms include no numbness and no weakness.    Abdominal Pain The primary symptoms of the illness include abdominal pain. The primary symptoms of the illness do not include fever, shortness of breath, nausea,  vomiting, diarrhea or dysuria.  Additional symptoms associated with the illness include back pain. Symptoms associated with the illness do not include chills or frequency.    Past Medical History  Diagnosis Date  . Depression     History reviewed. No pertinent past surgical history.  History reviewed. No pertinent family history.  History  Substance Use Topics  . Smoking status: Current Everyday Smoker -- 1.0 packs/day for 35 years    Types: Cigarettes  . Smokeless tobacco: Not on file  . Alcohol Use: 0.0 oz/week     reports drinks twice weekly    OB History    Grav Para Term Preterm Abortions TAB SAB Ect Mult Living                  Review of Systems  Constitutional: Negative for fever and chills.  HENT: Negative for congestion and rhinorrhea.   Respiratory: Positive for cough. Negative for shortness of breath.   Cardiovascular: Positive for chest pain.  Gastrointestinal: Positive for abdominal pain. Negative for nausea, vomiting, diarrhea and abdominal distention.  Genitourinary: Negative for dysuria, frequency, flank pain and pelvic pain.  Musculoskeletal: Positive for back pain.  Neurological: Negative for weakness and numbness.    Allergies  Review of patient's allergies indicates no known allergies.  Home Medications   Current Outpatient Rx  Name Route Sig Dispense Refill  . CLONAZEPAM 0.5 MG PO TABS Oral Take 0.25-0.5 mg by mouth 2 (two) times daily. Dose depending on level of anxiety    . FLUOXETINE HCL 20 MG PO CAPS Oral Take 20 mg by mouth daily.      Marland Kitchen  RISPERIDONE 0.5 MG PO TABS Oral Take 0.5 mg by mouth at bedtime.    . TRAZODONE HCL 50 MG PO TABS Oral Take 50 mg by mouth at bedtime.      . TRAMADOL HCL 50 MG PO TABS Oral Take 1 tablet (50 mg total) by mouth every 6 (six) hours as needed for pain. 15 tablet 0    BP 156/88  Pulse 49  Temp(Src) 98.3 F (36.8 C) (Oral)  Resp 9  SpO2 99%  Physical Exam  Constitutional: She is oriented to person,  place, and time. She appears well-developed and well-nourished.  HENT:  Head: Normocephalic.  Eyes: Pupils are equal, round, and reactive to light.  Neck: Normal range of motion.  Cardiovascular: Normal rate and regular rhythm.   Pulmonary/Chest: Effort normal and breath sounds normal. She has no wheezes.  Abdominal: She exhibits no distension. There is tenderness in the epigastric area and suprapubic area.  Musculoskeletal: Normal range of motion.       Back:       Pain radiates for lumbar spine bilaterally to flanks  Neurological: She is alert and oriented to person, place, and time.  Skin: Skin is warm.    ED Course  Procedures (including critical care time)  Labs Reviewed  URINALYSIS, ROUTINE W REFLEX MICROSCOPIC - Abnormal; Notable for the following:    Hgb urine dipstick LARGE (*)    Protein, ur 30 (*)    All other components within normal limits  COMPREHENSIVE METABOLIC PANEL - Abnormal; Notable for the following:    Albumin 3.3 (*)    GFR calc non Af Amer 66 (*)    GFR calc Af Amer 77 (*)    All other components within normal limits  CBC  DIFFERENTIAL  LIPASE, BLOOD  POCT I-STAT TROPONIN I  URINE MICROSCOPIC-ADD ON  URINE CULTURE   Dg Chest 2 View  12/08/2011  *RADIOLOGY REPORT*  Clinical Data: Central chest pain, weakness and dizziness.  History of smoking for 30 years.  CHEST - 2 VIEW  Comparison: Chest x-ray 06/26/2007.  Findings: Lung volumes are normal.  No consolidative airspace disease.  No pleural effusions.  No pneumothorax.  No pulmonary nodule or mass noted.  Pulmonary vasculature and the cardiomediastinal silhouette are within normal limits.   Mild elevation right hemidiaphragm unchanged.  IMPRESSION: 1. No radiographic evidence of acute cardiopulmonary disease.  Original Report Authenticated By: Florencia Reasons, M.D.     1. Exacerbation of chronic back pain   2. Epigastric discomfort     ED ECG REPORT   Date: 12/08/2011  EKG Time: 4:35 PM  Rate:  63  Rhythm: normal sinus rhythm,  , there are no previous tracings available for comparison  Axis: normal  Intervals:none  ST&T Change: non specific T wave abnormalities  Narrative Interpretation: abnormal EKG            MDM  Exacerbation of chronic back pain.  Due to complaint of chest pain.  Will obtain one set of cardiac markers, as it's been more than 24 hours and when to ASAP criteria one set of markers is all that is required to rule out acute event        Arman Filter, NP 12/08/11 1635

## 2011-12-09 LAB — URINE CULTURE: Culture: NO GROWTH

## 2013-03-01 ENCOUNTER — Telehealth (HOSPITAL_COMMUNITY): Payer: Self-pay | Admitting: Emergency Medicine

## 2013-03-01 ENCOUNTER — Emergency Department (HOSPITAL_COMMUNITY): Payer: Medicare Other

## 2013-03-01 ENCOUNTER — Encounter (HOSPITAL_COMMUNITY): Payer: Self-pay | Admitting: Emergency Medicine

## 2013-03-01 ENCOUNTER — Emergency Department (HOSPITAL_COMMUNITY)
Admission: EM | Admit: 2013-03-01 | Discharge: 2013-03-01 | Disposition: A | Discharge status: Home / Self Care | Payer: Medicare Other | Attending: Emergency Medicine | Admitting: Emergency Medicine

## 2013-03-01 DIAGNOSIS — R059 Cough, unspecified: Secondary | ICD-10-CM | POA: Insufficient documentation

## 2013-03-01 DIAGNOSIS — R05 Cough: Secondary | ICD-10-CM | POA: Insufficient documentation

## 2013-03-01 DIAGNOSIS — F3289 Other specified depressive episodes: Secondary | ICD-10-CM | POA: Insufficient documentation

## 2013-03-01 DIAGNOSIS — F329 Major depressive disorder, single episode, unspecified: Secondary | ICD-10-CM | POA: Insufficient documentation

## 2013-03-01 DIAGNOSIS — F17200 Nicotine dependence, unspecified, uncomplicated: Secondary | ICD-10-CM | POA: Insufficient documentation

## 2013-03-01 DIAGNOSIS — G43909 Migraine, unspecified, not intractable, without status migrainosus: Secondary | ICD-10-CM | POA: Insufficient documentation

## 2013-03-01 DIAGNOSIS — Z79899 Other long term (current) drug therapy: Secondary | ICD-10-CM | POA: Insufficient documentation

## 2013-03-01 DIAGNOSIS — Z8679 Personal history of other diseases of the circulatory system: Secondary | ICD-10-CM | POA: Insufficient documentation

## 2013-03-01 HISTORY — DX: Migraine, unspecified, not intractable, without status migrainosus: G43.909

## 2013-03-01 MED ORDER — HYDROCOD POLST-CHLORPHEN POLST 10-8 MG/5ML PO LQCR: 5.0000 mL | Freq: Every evening | ORAL | Status: DC | PRN

## 2013-03-01 NOTE — ED Provider Notes (Signed)
History     CSN: [id number]  Arrival date & time 03/01/13  1049   First MD Initiated Contact with Patient 03/01/13 1132      Chief Complaint  Patient presents with  . Cough    (Consider location/radiation/quality/duration/timing/severity/associated sxs/prior treatment) HPI Comments: Patient is a 57 year old female presents today for 3 weeks of a not improving dry cough and tickle in her throat. She states she must have "drank 10 bottles of cough syrup" with no relief. She states the cough is worse at night. Sometimes she brings up clear sputum. She does admit to beginning an ACE inhibitor 4 weeks ago. She denies fever, chills, shortness of breath, chest pain, nausea, vomiting, abdominal pain, weakness, numbness, congestion, rhinorrhea, ear pain, sore throat.  Patient is a 57 y.o. female presenting with cough. The history is provided by the patient. No language interpreter was used.  Cough Cough characteristics:  Non-productive Severity:  Moderate Onset quality:  Sudden Duration:  3 weeks Timing:  Constant Progression:  Unchanged Chronicity:  New Smoker: yes   Context: not sick contacts and not upper respiratory infection   Relieved by:  Nothing Worsened by:  Nothing tried Ineffective treatments:  Cough suppressants Associated symptoms: no chest pain, no chills, no fever, no rhinorrhea and no shortness of breath     Past Medical History  Diagnosis Date  . Depression   . Migraine headache     History reviewed. No pertinent past surgical history.  History reviewed. No pertinent family history.  History  Substance Use Topics  . Smoking status: Current Every Day Smoker -- 1.00 packs/day for 35 years    Types: Cigarettes  . Smokeless tobacco: Not on file  . Alcohol Use: 0.0 oz/week     Comment: reports drinks twice weekly    OB History   Grav Para Term Preterm Abortions TAB SAB Ect Mult Living                  Review of Systems  Constitutional: Negative for fever  and chills.  HENT: Negative for congestion, rhinorrhea, neck pain and neck stiffness.   Respiratory: Positive for cough. Negative for chest tightness and shortness of breath.   Cardiovascular: Negative for chest pain.  Gastrointestinal: Negative for nausea, vomiting and abdominal pain.  All other systems reviewed and are negative.    Allergies  Review of patient's allergies indicates no known allergies.  Home Medications   Current Outpatient Rx  Name  Route  Sig  Dispense  Refill  . benazepril-hydrochlorthiazide (LOTENSIN HCT) 10-12.5 MG per tablet   Oral   Take 1 tablet by mouth daily.         Marland Kitchen FLUoxetine (PROZAC) 20 MG capsule   Oral   Take 20 mg by mouth daily.           Marland Kitchen loratadine (CLARITIN) 10 MG tablet   Oral   Take 10 mg by mouth daily.         . risperiDONE (RISPERDAL) 1 MG tablet   Oral   Take 1 mg by mouth daily.         . traZODone (DESYREL) 50 MG tablet   Oral   Take 50 mg by mouth at bedtime.            BP 130/100  Pulse 105  Temp(Src) 98.9 F (37.2 C) (Oral)  Resp 18  SpO2 96%  Physical Exam  Nursing note and vitals reviewed. Constitutional: She is oriented to person, place, and time.  She appears well-developed and well-nourished. No distress.  HENT:  Head: Normocephalic and atraumatic.  Right Ear: External ear normal.  Left Ear: External ear normal.  Nose: Nose normal.  Mouth/Throat: Oropharynx is clear and moist.  Eyes: Conjunctivae are normal.  Neck: Normal range of motion. No tracheal deviation present.  Cardiovascular: Normal rate, regular rhythm and normal heart sounds.   Pulmonary/Chest: Effort normal and breath sounds normal. No stridor. No respiratory distress. She has no wheezes. She has no rales.  Dry cough heard while examining the patient  Abdominal: Soft. She exhibits no distension. There is no tenderness.  Musculoskeletal: Normal range of motion.  Neurological: She is alert and oriented to person, place, and time.  She has normal strength.  Skin: Skin is warm and dry. She is not diaphoretic. No erythema.  Psychiatric: She has a normal mood and affect. Her behavior is normal.    ED Course  Procedures (including critical care time)  Labs Reviewed - No data to display Dg Chest 2 View  03/01/2013  *RADIOLOGY REPORT*  Clinical Data: Congestion  CHEST - 2 VIEW  Comparison: Prior chest x-ray 12/08/2011  Findings: The lungs are well-aerated.  Negative for pulmonary edema, focal airspace consolidation, pulmonary nodule, pleural effusion or pneumothorax.  The cardiac and mediastinal contours are within normal limits.  No acute osseous abnormality.  Visualized upper abdomen is unremarkable.  IMPRESSION:  1.  No acute cardiopulmonary disease.  2.  Normal chest radiographs.   Original Report Authenticated By: Malachy Moan, M.D.      1. Cough due to ACE inhibitor       MDM  Patient presents the ED with a dry cough and "tickle in her throat" for the past 3 weeks. She was started on an ACE inhibitor or 4 weeks ago. Chest x-ray negative for acute pathology. Lungs clear to auscultation. Oxygen saturation 96% on room air. No concern for pneumonia. Discussed that this cough is consistent with that caused by ACE inhibitors. Stop your ACE inhibitor. Make an appointment with your primary care physician next week to discuss alternative blood pressure medications. She was given Tussionex because she "cannot bear another night of coughing".         Mora Bellman, PA-C 03/01/13 1526

## 2013-03-01 NOTE — ED Notes (Addendum)
Pt c/o cough x 3wks. Pt was seen at pcp and given cough medicine and "pills" Pt states she ran out of cough syrup and has been using otc cough medications.Pt states her cough gets worse at night. Pt states she has a "tickle in her throat" and then has a coughing spell.  Pt recently began taking Benazepril-htc about a month ago for high blood pressure. Pt is also a smoker.

## 2013-03-01 NOTE — ED Notes (Signed)
Patient stated that Tussinex Rx was too expensive. Advised her to have pharmacy call us or she may purchase some over the counter cold medication.

## 2013-03-01 NOTE — ED Notes (Signed)
Pt c/o productive cough with clear sputum x 3 weeks; pt sts hacking cough worse at night; pt denies fever

## 2013-03-01 NOTE — ED Provider Notes (Signed)
Medical screening examination/treatment/procedure(s) were performed by non-physician practitioner and as supervising physician I was immediately available for consultation/collaboration.  Zhanna Melin K Linker, MD 03/01/13 1600 

## 2013-03-01 NOTE — ED Notes (Signed)
Discharge instructions reviewed. Pt verbalized understanding.  

## 2013-06-11 ENCOUNTER — Encounter (HOSPITAL_COMMUNITY): Payer: Self-pay | Admitting: *Deleted

## 2013-06-11 ENCOUNTER — Encounter (HOSPITAL_COMMUNITY): Payer: Self-pay | Admitting: Nurse Practitioner

## 2013-06-11 ENCOUNTER — Encounter (HOSPITAL_COMMUNITY): Payer: Self-pay | Admitting: Licensed Clinical Social Worker

## 2013-06-11 ENCOUNTER — Emergency Department (HOSPITAL_COMMUNITY)
Admission: EM | Admit: 2013-06-11 | Discharge: 2013-06-11 | Disposition: A | Discharge status: Other Acute Inpatient Hospital | Payer: Medicare Other | Source: Home / Self Care

## 2013-06-11 ENCOUNTER — Inpatient Hospital Stay (HOSPITAL_COMMUNITY)
Admission: AD | Admit: 2013-06-11 | Discharge: 2013-06-14 | DRG: 897 | Disposition: A | Discharge status: Home / Self Care | Payer: Medicare Other | Source: Intra-hospital | Attending: Psychiatry | Admitting: Psychiatry

## 2013-06-11 ENCOUNTER — Telehealth (HOSPITAL_COMMUNITY): Payer: Self-pay | Admitting: Licensed Clinical Social Worker

## 2013-06-11 DIAGNOSIS — F329 Major depressive disorder, single episode, unspecified: Secondary | ICD-10-CM | POA: Diagnosis present

## 2013-06-11 DIAGNOSIS — F1020 Alcohol dependence, uncomplicated: Secondary | ICD-10-CM

## 2013-06-11 DIAGNOSIS — G8929 Other chronic pain: Secondary | ICD-10-CM | POA: Insufficient documentation

## 2013-06-11 DIAGNOSIS — Z79899 Other long term (current) drug therapy: Secondary | ICD-10-CM

## 2013-06-11 DIAGNOSIS — F10239 Alcohol dependence with withdrawal, unspecified: Principal | ICD-10-CM | POA: Diagnosis present

## 2013-06-11 DIAGNOSIS — M549 Dorsalgia, unspecified: Secondary | ICD-10-CM | POA: Diagnosis present

## 2013-06-11 DIAGNOSIS — J45909 Unspecified asthma, uncomplicated: Secondary | ICD-10-CM | POA: Diagnosis present

## 2013-06-11 DIAGNOSIS — F1420 Cocaine dependence, uncomplicated: Secondary | ICD-10-CM | POA: Diagnosis present

## 2013-06-11 DIAGNOSIS — F10939 Alcohol use, unspecified with withdrawal, unspecified: Principal | ICD-10-CM | POA: Diagnosis present

## 2013-06-11 HISTORY — DX: Dorsalgia, unspecified: M54.9

## 2013-06-11 HISTORY — DX: Alcohol dependence, uncomplicated: F10.20

## 2013-06-11 HISTORY — DX: Unspecified asthma, uncomplicated: J45.909

## 2013-06-11 HISTORY — DX: Other chronic pain: G89.29

## 2013-06-11 LAB — COMPREHENSIVE METABOLIC PANEL
ALT: 18 U/L (ref 0–35)
AST: 23 U/L (ref 0–37)
Albumin: 3.3 g/dL — ABNORMAL LOW (ref 3.5–5.2)
Alkaline Phosphatase: 80 U/L (ref 39–117)
Chloride: 103 mEq/L (ref 96–112)
Potassium: 2.9 mEq/L — ABNORMAL LOW (ref 3.5–5.1)
Sodium: 144 mEq/L (ref 135–145)
Total Bilirubin: 0.4 mg/dL (ref 0.3–1.2)

## 2013-06-11 LAB — CBC
HCT: 42.7 % (ref 36.0–46.0)
Platelets: 224 10*3/uL (ref 150–400)
RDW: 14.2 % (ref 11.5–15.5)
WBC: 4.8 10*3/uL (ref 4.0–10.5)

## 2013-06-11 LAB — POCT PREGNANCY, URINE: Preg Test, Ur: NEGATIVE

## 2013-06-11 LAB — ACETAMINOPHEN LEVEL: Acetaminophen (Tylenol), Serum: <15 ug/mL (ref 10–30)

## 2013-06-11 LAB — RAPID URINE DRUG SCREEN, HOSP PERFORMED: Barbiturates: NOT DETECTED

## 2013-06-11 LAB — SALICYLATE LEVEL: Salicylate Lvl: <2 mg/dL — ABNORMAL LOW (ref 2.8–20.0)

## 2013-06-11 MED ORDER — TRAZODONE HCL 50 MG PO TABS
50.0000 mg | ORAL_TABLET | Freq: Every evening | ORAL | Status: DC | PRN
  Administered 2013-06-11 – 2013-06-13 (×3): 50 mg via ORAL
  Filled 2013-06-11 (×8): qty 1
  Filled 2013-06-11: qty 10
  Filled 2013-06-11: qty 1
  Filled 2013-06-11: qty 10

## 2013-06-11 MED ORDER — ACETAMINOPHEN 325 MG PO TABS: 650.0000 mg | ORAL_TABLET | ORAL | Status: DC | PRN

## 2013-06-11 MED ORDER — ADULT MULTIVITAMIN W/MINERALS CH
1.0000 | ORAL_TABLET | Freq: Every day | ORAL | Status: DC
  Administered 2013-06-12 – 2013-06-14 (×3): 1 via ORAL
  Filled 2013-06-11: qty 5
  Filled 2013-06-11 (×4): qty 1

## 2013-06-11 MED ORDER — THIAMINE HCL 100 MG/ML IJ SOLN: 100.0000 mg | Freq: Every day | INTRAMUSCULAR | Status: DC

## 2013-06-11 MED ORDER — VITAMIN B-1 100 MG PO TABS
100.0000 mg | ORAL_TABLET | Freq: Every day | ORAL | Status: DC
  Administered 2013-06-11: 100 mg via ORAL
  Filled 2013-06-11: qty 1

## 2013-06-11 MED ORDER — LORAZEPAM 1 MG PO TABS: 0.0000 mg | ORAL_TABLET | Freq: Two times a day (BID) | ORAL | Status: DC

## 2013-06-11 MED ORDER — ONDANSETRON HCL 4 MG PO TABS: 4.0000 mg | ORAL_TABLET | Freq: Three times a day (TID) | ORAL | Status: DC | PRN

## 2013-06-11 MED ORDER — CHLORDIAZEPOXIDE HCL 25 MG PO CAPS
25.0000 mg | ORAL_CAPSULE | Freq: Three times a day (TID) | ORAL | Status: AC
  Administered 2013-06-13 – 2013-06-14 (×3): 25 mg via ORAL
  Filled 2013-06-11 (×3): qty 1

## 2013-06-11 MED ORDER — CHLORDIAZEPOXIDE HCL 25 MG PO CAPS: 25.0000 mg | ORAL_CAPSULE | ORAL | Status: DC

## 2013-06-11 MED ORDER — ACETAMINOPHEN 325 MG PO TABS
650.0000 mg | ORAL_TABLET | Freq: Four times a day (QID) | ORAL | Status: DC | PRN
  Administered 2013-06-13: 650 mg via ORAL

## 2013-06-11 MED ORDER — MAGNESIUM HYDROXIDE 400 MG/5ML PO SUSP: 30.0000 mL | Freq: Every day | ORAL | Status: DC | PRN

## 2013-06-11 MED ORDER — LORAZEPAM 1 MG PO TABS
1.0000 mg | ORAL_TABLET | Freq: Four times a day (QID) | ORAL | Status: DC | PRN
  Administered 2013-06-11: 1 mg via ORAL
  Filled 2013-06-11: qty 1

## 2013-06-11 MED ORDER — LORAZEPAM 1 MG PO TABS: 0.0000 mg | ORAL_TABLET | Freq: Four times a day (QID) | ORAL | Status: DC

## 2013-06-11 MED ORDER — ALUM & MAG HYDROXIDE-SIMETH 200-200-20 MG/5ML PO SUSP: 30.0000 mL | ORAL | Status: DC | PRN

## 2013-06-11 MED ORDER — NICOTINE 21 MG/24HR TD PT24
21.0000 mg | MEDICATED_PATCH | Freq: Every day | TRANSDERMAL | Status: DC
  Administered 2013-06-11: 21 mg via TRANSDERMAL
  Filled 2013-06-11: qty 1

## 2013-06-11 MED ORDER — FLUOXETINE HCL 20 MG PO CAPS
40.0000 mg | ORAL_CAPSULE | Freq: Every day | ORAL | Status: DC
  Administered 2013-06-11: 40 mg via ORAL
  Filled 2013-06-11: qty 2

## 2013-06-11 MED ORDER — POTASSIUM CHLORIDE CRYS ER 20 MEQ PO TBCR
20.0000 meq | EXTENDED_RELEASE_TABLET | Freq: Two times a day (BID) | ORAL | Status: AC
  Administered 2013-06-11 – 2013-06-13 (×4): 20 meq via ORAL
  Filled 2013-06-11 (×6): qty 1

## 2013-06-11 MED ORDER — VITAMIN B-1 100 MG PO TABS
100.0000 mg | ORAL_TABLET | Freq: Every day | ORAL | Status: DC
  Administered 2013-06-12 – 2013-06-14 (×3): 100 mg via ORAL
  Filled 2013-06-11 (×5): qty 1

## 2013-06-11 MED ORDER — FOLIC ACID 1 MG PO TABS
1.0000 mg | ORAL_TABLET | Freq: Every day | ORAL | Status: DC
  Administered 2013-06-11: 1 mg via ORAL
  Filled 2013-06-11: qty 1

## 2013-06-11 MED ORDER — CHLORDIAZEPOXIDE HCL 25 MG PO CAPS
25.0000 mg | ORAL_CAPSULE | Freq: Four times a day (QID) | ORAL | Status: AC
  Administered 2013-06-11 – 2013-06-13 (×6): 25 mg via ORAL
  Filled 2013-06-11 (×5): qty 1

## 2013-06-11 MED ORDER — IBUPROFEN 200 MG PO TABS: 600.0000 mg | ORAL_TABLET | Freq: Three times a day (TID) | ORAL | Status: DC | PRN

## 2013-06-11 MED ORDER — LOPERAMIDE HCL 2 MG PO CAPS: 2.0000 mg | ORAL_CAPSULE | ORAL | Status: DC | PRN

## 2013-06-11 MED ORDER — ADULT MULTIVITAMIN W/MINERALS CH: 1.0000 | ORAL_TABLET | Freq: Every day | ORAL | Status: DC

## 2013-06-11 MED ORDER — RISPERIDONE 0.5 MG PO TABS: 1.0000 mg | ORAL_TABLET | Freq: Every day | ORAL | Status: DC

## 2013-06-11 MED ORDER — LORAZEPAM 2 MG/ML IJ SOLN: 1.0000 mg | Freq: Four times a day (QID) | INTRAMUSCULAR | Status: DC | PRN

## 2013-06-11 MED ORDER — HYDROXYZINE HCL 25 MG PO TABS: 25.0000 mg | ORAL_TABLET | Freq: Four times a day (QID) | ORAL | Status: DC | PRN

## 2013-06-11 MED ORDER — GABAPENTIN 100 MG PO CAPS
100.0000 mg | ORAL_CAPSULE | Freq: Three times a day (TID) | ORAL | Status: DC
  Administered 2013-06-11: 100 mg via ORAL
  Filled 2013-06-11 (×2): qty 1

## 2013-06-11 MED ORDER — ADULT MULTIVITAMIN W/MINERALS CH
1.0000 | ORAL_TABLET | Freq: Every day | ORAL | Status: DC
  Administered 2013-06-11: 1 via ORAL
  Filled 2013-06-11: qty 1

## 2013-06-11 MED ORDER — ONDANSETRON 4 MG PO TBDP: 4.0000 mg | ORAL_TABLET | Freq: Four times a day (QID) | ORAL | Status: DC | PRN

## 2013-06-11 MED ORDER — THIAMINE HCL 100 MG/ML IJ SOLN
100.0000 mg | Freq: Once | INTRAMUSCULAR | Status: AC
  Administered 2013-06-11: 100 mg via INTRAMUSCULAR

## 2013-06-11 MED ORDER — TRAZODONE HCL 50 MG PO TABS: 50.0000 mg | ORAL_TABLET | Freq: Every day | ORAL | Status: DC

## 2013-06-11 MED ORDER — CHLORDIAZEPOXIDE HCL 25 MG PO CAPS: 25.0000 mg | ORAL_CAPSULE | Freq: Four times a day (QID) | ORAL | Status: DC | PRN

## 2013-06-11 MED ORDER — ZOLPIDEM TARTRATE 5 MG PO TABS: 5.0000 mg | ORAL_TABLET | Freq: Every evening | ORAL | Status: DC | PRN

## 2013-06-11 MED ORDER — CHLORDIAZEPOXIDE HCL 25 MG PO CAPS: 25.0000 mg | ORAL_CAPSULE | Freq: Every day | ORAL | Status: DC

## 2013-06-11 MED ORDER — NICOTINE 21 MG/24HR TD PT24: 21.0000 mg | MEDICATED_PATCH | Freq: Every day | TRANSDERMAL | Status: DC | PRN

## 2013-06-11 NOTE — ED Notes (Addendum)
Pt placed in scrubs & belongings in a pt belonging bag. All belongings given to daughter to place in car. Instructed that pt cannot have anything in room. Diet tray ordered.

## 2013-06-11 NOTE — ED Notes (Signed)
Pt. Transported to [facility] by security with sitter. Pt. Belongings sent with patient.

## 2013-06-11 NOTE — Tx Team (Signed)
Initial Interdisciplinary Treatment Plan  PATIENT STRENGTHS: (choose at least two) Ability for insight Capable of independent living Communication skills General fund of knowledge Motivation for treatment/growth Physical Health Supportive family/friends  PATIENT STRESSORS: Financial difficulties Substance abuse   PROBLEM LIST: Problem List/Patient Goals Date to be addressed Date deferred Reason deferred Estimated date of resolution  " I keep having these dreams that I'ma die" 06/11/13     "Try to help me get straightened out for not picking up a drink or drug again." 06/11/13           Substance abuse 06/11/13     Increased risk for suicide 06/11/13                              DISCHARGE CRITERIA:  Ability to meet basic life and health needs Adequate post-discharge living arrangements Improved stabilization in mood, thinking, and/or behavior Medical problems require only outpatient monitoring Motivation to continue treatment in a less acute level of care Need for constant or close observation no longer present Reduction of life-threatening or endangering symptoms to within safe limits Safe-care adequate arrangements made Verbal commitment to aftercare and medication compliance Withdrawal symptoms are absent or subacute and managed without 24-hour nursing intervention  PRELIMINARY DISCHARGE PLAN: Attend aftercare/continuing care group Attend 12-step recovery group Placement in alternative living arrangements  PATIENT/FAMIILY INVOLVEMENT: This treatment plan has been presented to and reviewed with the patient, [name], and/or family member.  The patient and family have been given the opportunity to ask questions and make suggestions.  [name] [facility] 06/11/2013, 7:51 PM

## 2013-06-11 NOTE — ED Notes (Signed)
Called service response to check on delay in getting pt tray delivered. They state they have no record of a tray being ordered for the patient at all. Diet ordered

## 2013-06-11 NOTE — Progress Notes (Signed)
Adult Psychoeducational Group Note  Date:  06/11/2013 Time:  9:42 PM  Group Topic/Focus:  Identifying Needs:   The focus of this group is to help patients identify their personal needs that have been historically problematic and identify healthy behaviors to address their needs.  Participation Level:  Active  Participation Quality:  Appropriate, Attentive, Sharing and Supportive  Affect:  Appropriate and Excited  Cognitive:  Alert, Appropriate and Oriented  Insight: Appropriate and Good  Engagement in Group:  Engaged and Supportive  Modes of Intervention:  Discussion, Problem-solving and Support  Additional Comments:  Talitha was just admitted and was able to participate in group.  Her self care card stated for her to rejuvenate her body, mind and spirit with a good night rest.   Annell Greening [place] 06/11/2013, 9:42 PM

## 2013-06-11 NOTE — ED Notes (Addendum)
Requesting detox from alcohol & crack/cocaine. Last used crack & alcohol was at midnight.  States drinks average fifth daily x 1 year & daily crack/cocaine use x 1 year. Denies SI, HI.  States usually starts drinking at 4pm & drinks until falls aleep. Last went to rehab/detox 4 years ago.  Reports having nightmares "that I'm dying from the drinking & drugs" for 2 weeks. Pt pleasant, calm & cooperative

## 2013-06-11 NOTE — ED Notes (Signed)
Pt went to [facility] "to get some help with my drinking and drugging" and they sent her here for detox first. States she drinks alcohol every day and her last drink was last night. States she uses crack/cocaine and last used last night. Pt reports she is having nightmares and that has made her want to quit using substances. Pt denies SI/HI. Pt states right now she "just dont feel too good, i feel sad." A&Ox4, calm and cooperative

## 2013-06-11 NOTE — BH Assessment (Signed)
Attempted to assess patient via tele-assessment, however; machine would not connect call.

## 2013-06-11 NOTE — BH Assessment (Signed)
Machine now working and [name] has agreed to complete the assessment.

## 2013-06-11 NOTE — ED Notes (Addendum)
Request for telepsych called to paige at [facility], she will call back with time of assessment

## 2013-06-11 NOTE — BH Assessment (Addendum)
Writer notified by Diplomatic Services operational officer that patient needs to be tele-assessed. Also spoke to Dr. Effie Shy. This Clinical research associate will document this in the log and call Annette (patient's nurse) when staff is available to complete this task. The time of the assessment will be discussed at that time.
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
## 2013-06-11 NOTE — ED Provider Notes (Signed)
CSN: [id number]     Arrival date & time 06/11/13  4098 History     First MD Initiated Contact with Patient 06/11/13 1041     Chief Complaint  Patient presents with  . Medical Clearance   (Consider location/radiation/quality/duration/timing/severity/associated sxs/prior Treatment) HPI Comments: [name] is a 57 y.o. Female who states that she is trying to get off alcohol and cocaine. She drinks 1/5 of gin each day. She cannot remember the last time she was dry. She denies suicide or homicide ideation. She lives alone. Her daughter brought her here today. She states that she went to [facility] first and they suggested that she come here for detoxification. She denies recent illnesses. There are no other known modifying factors.  The history is provided by the patient.    Past Medical History  Diagnosis Date  . Depression   . Migraine headache   . Chronic back pain    History reviewed. No pertinent past surgical history. History reviewed. No pertinent family history. History  Substance Use Topics  . Smoking status: Current Every Day Smoker -- 1.00 packs/day for 35 years    Types: Cigarettes  . Smokeless tobacco: Not on file  . Alcohol Use: 0.0 oz/week     Comment: fifth liquor daily   OB History   Grav Para Term Preterm Abortions TAB SAB Ect Mult Living                 Review of Systems  All other systems reviewed and are negative.    Allergies  Review of patient's allergies indicates no known allergies.  Home Medications   Current Outpatient Rx  Name  Route  Sig  Dispense  Refill  . FLUoxetine (PROZAC) 40 MG capsule   Oral   Take 40 mg by mouth daily.         Marland Kitchen gabapentin (NEURONTIN) 100 MG capsule   Oral   Take 100 mg by mouth 3 (three) times daily.         . Multiple Vitamin (MULTIVITAMIN WITH MINERALS) TABS tablet   Oral   Take 1 tablet by mouth daily.         . risperiDONE (RISPERDAL) 1 MG tablet   Oral   Take 1 mg by mouth at bedtime.           . traZODone (DESYREL) 50 MG tablet   Oral   Take 50 mg by mouth at bedtime.           BP 141/86  Pulse 65  Temp(Src) 98 F (36.7 C) (Oral)  Resp 18  Ht 5\' 7"  (1.702 m)  Wt 150 lb (68.04 kg)  BMI 23.49 kg/m2  SpO2 96% Physical Exam  Nursing note and vitals reviewed. Constitutional: She is oriented to person, place, and time. She appears well-developed and well-nourished.  HENT:  Head: Normocephalic and atraumatic.  Eyes: Conjunctivae and EOM are normal. Pupils are equal, round, and reactive to light.  Neck: Normal range of motion and phonation normal. Neck supple.  Cardiovascular: Normal rate, regular rhythm and intact distal pulses.   Pulmonary/Chest: Effort normal and breath sounds normal. She exhibits no tenderness.  Abdominal: Soft. She exhibits no distension. There is no tenderness. There is no guarding.  Musculoskeletal: Normal range of motion.  Neurological: She is alert and oriented to person, place, and time. She has normal strength. She exhibits normal muscle tone.  Mild tremor  Skin: Skin is warm and dry.  Psychiatric: She has a normal  mood and affect. Her behavior is normal. Judgment and thought content normal.    ED Course   Procedures (including critical care time)  Medications  FLUoxetine (PROZAC) capsule 40 mg (not administered)  gabapentin (NEURONTIN) capsule 100 mg (not administered)  multivitamin with minerals tablet 1 tablet (not administered)  risperiDONE (RISPERDAL) tablet 1 mg (not administered)  traZODone (DESYREL) tablet 50 mg (not administered)  acetaminophen (TYLENOL) tablet 650 mg (not administered)  ibuprofen (ADVIL,MOTRIN) tablet 600 mg (not administered)  zolpidem (AMBIEN) tablet 5 mg (not administered)  nicotine (NICODERM CQ - dosed in mg/24 hours) patch 21 mg (not administered)  ondansetron (ZOFRAN) tablet 4 mg (not administered)  alum & mag hydroxide-simeth (MAALOX/MYLANTA) 200-200-20 MG/5ML suspension 30 mL (not administered)   LORazepam (ATIVAN) tablet 1 mg (not administered)    Or  LORazepam (ATIVAN) injection 1 mg (not administered)  thiamine (VITAMIN B-1) tablet 100 mg (not administered)  folic acid (FOLVITE) tablet 1 mg (not administered)   13:00- consult TTS. Patient moved to Pod C.   Labs Reviewed  COMPREHENSIVE METABOLIC PANEL - Abnormal; Notable for the following:    Potassium 2.9 (*)    Glucose, Bld 122 (*)    Albumin 3.3 (*)    GFR calc non Af Amer 62 (*)    GFR calc Af Amer 72 (*)    All other components within normal limits  ETHANOL - Abnormal; Notable for the following:    Alcohol, Ethyl (B) 97 (*)    All other components within normal limits  SALICYLATE LEVEL - Abnormal; Notable for the following:    Salicylate Lvl <2.0 (*)    All other components within normal limits  URINE RAPID DRUG SCREEN (HOSP PERFORMED) - Abnormal; Notable for the following:    Cocaine POSITIVE (*)    Benzodiazepines POSITIVE (*)    All other components within normal limits  ACETAMINOPHEN LEVEL  CBC  POCT PREGNANCY, URINE   No results found. No diagnosis found.  MDM  Polysubstance abuse, without suicidal ideation. She is very early signs of alcohol withdrawal. She's been treated for alcohol withdrawal syndrome in the ED, while a psychiatric services consultation is undertaken.  Plan: Management per oncoming provider team in consultation with TTS  Flint Melter, MD 06/11/13 [phone]

## 2013-06-12 ENCOUNTER — Encounter (HOSPITAL_COMMUNITY): Payer: Self-pay | Admitting: Psychiatry

## 2013-06-12 DIAGNOSIS — F1994 Other psychoactive substance use, unspecified with psychoactive substance-induced mood disorder: Secondary | ICD-10-CM

## 2013-06-12 DIAGNOSIS — F1420 Cocaine dependence, uncomplicated: Secondary | ICD-10-CM | POA: Diagnosis present

## 2013-06-12 DIAGNOSIS — F1020 Alcohol dependence, uncomplicated: Secondary | ICD-10-CM

## 2013-06-12 LAB — COMPREHENSIVE METABOLIC PANEL
ALT: 16 U/L (ref 0–35)
CO2: 29 mEq/L (ref 19–32)
Calcium: 9.3 mg/dL (ref 8.4–10.5)
Creatinine, Ser: 0.98 mg/dL (ref 0.50–1.10)
GFR calc Af Amer: 73 mL/min — ABNORMAL LOW (ref >90)
GFR calc non Af Amer: 63 mL/min — ABNORMAL LOW (ref >90)
Glucose, Bld: 101 mg/dL — ABNORMAL HIGH (ref 70–99)
Sodium: 139 mEq/L (ref 135–145)

## 2013-06-12 MED ORDER — FLUOXETINE HCL 20 MG PO CAPS
40.0000 mg | ORAL_CAPSULE | Freq: Every day | ORAL | Status: DC
  Administered 2013-06-12 – 2013-06-14 (×3): 40 mg via ORAL
  Filled 2013-06-12 (×2): qty 2
  Filled 2013-06-12: qty 10
  Filled 2013-06-12: qty 2
  Filled 2013-06-12: qty 1
  Filled 2013-06-12 (×2): qty 2

## 2013-06-12 MED ORDER — RISPERIDONE 1 MG PO TABS
1.0000 mg | ORAL_TABLET | Freq: Every day | ORAL | Status: DC
  Administered 2013-06-12 – 2013-06-13 (×2): 1 mg via ORAL
  Filled 2013-06-12: qty 1
  Filled 2013-06-12: qty 5
  Filled 2013-06-12 (×2): qty 1

## 2013-06-12 MED ORDER — GABAPENTIN 100 MG PO CAPS
100.0000 mg | ORAL_CAPSULE | Freq: Three times a day (TID) | ORAL | Status: DC
  Administered 2013-06-12 – 2013-06-14 (×7): 100 mg via ORAL
  Filled 2013-06-12: qty 15
  Filled 2013-06-12 (×2): qty 1
  Filled 2013-06-12 (×2): qty 15
  Filled 2013-06-12 (×8): qty 1

## 2013-06-12 MED ORDER — NICOTINE 21 MG/24HR TD PT24
21.0000 mg | MEDICATED_PATCH | Freq: Every day | TRANSDERMAL | Status: DC
  Administered 2013-06-12 – 2013-06-14 (×3): 21 mg via TRANSDERMAL
  Filled 2013-06-12 (×4): qty 1

## 2013-06-12 MED ORDER — ALBUTEROL SULFATE HFA 108 (90 BASE) MCG/ACT IN AERS: 2.0000 | INHALATION_SPRAY | Freq: Four times a day (QID) | RESPIRATORY_TRACT | Status: DC | PRN

## 2013-06-12 NOTE — [facility] Group Notes (Signed)
[facility] LCSW Aftercare Discharge Planning Group Note   06/12/2013 9:20 AM  Participation Quality:  Minimal   Mood/Affect:  Depressed  Depression Rating:  8  Anxiety Rating:  8  Thoughts of Suicide:  No Will you contract for safety?   NA  Current AVH:  No  Plan for Discharge/Comments:  Pt reported that she decided to come to [facility] after realizing that her drinking got out of hand. She reports struggling with depression and anxiety as well. Pt currently goes to [facility] for med management but is interested in going to [facility] for treatment. She reports poor sleep last night.   Transportation Means: unknown at this time.   Supports: none identified. Pt lives alone.   [name], [name]
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
## 2013-06-12 NOTE — [facility] Group Notes (Signed)
[facility] LCSW Group Therapy  06/12/2013 2:40 PM  Type of Therapy:  Group Therapy  Participation Level:  Minimal  Participation Quality:  Inattentive and Resistant  Affect:  Resistant  Cognitive:  Lacking  Insight:  Limited  Engagement in Therapy:  Limited  Modes of Intervention:  Discussion, Education, Exploration, Socialization and Support  Summary of Progress/Problems: Emotion Regulation: This group focused on both positive and negative emotion identification and allowed group members to process ways to identify feelings, regulate negative emotions, and find healthy ways to manage internal/external emotions. Group members were asked to reflect on a time when their reaction to an emotion led to a negative outcome and explored how alternative responses using emotion regulation would have benefited them. Group members were also asked to discuss a time when emotion regulation was utilized when a negative emotion was experienced. [name] was resistant to sharing an emotion that she recently experienced or was currently experiencing. She remained attentive as others shared emotions they recently experienced but was resistant to participating in group conversation. [name] stated that she isolates from people when she is experiencing negative emotions because she most often turns to drugs and alcohol. "I feel ashamed and guilty." "I don't want to be bothered with people when I am feeling that low." [name] shows limited progress in the group setting and a lack of insight AEB her resistance to participation and inability to identify any coping skills, hobbies, or life passions that she hopes to pursue in the future.    Smart, [name] 06/12/2013, 2:40 PM
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
## 2013-06-12 NOTE — Tx Team (Signed)
Interdisciplinary Treatment Plan Update (Adult)  Date: 06/12/2013   Time Reviewed: 10:56 AM  Progress in Treatment:  Attending groups: Yes Participating in groups:  Yes Taking medication as prescribed: Yes  Tolerating medication: Yes  Family/Significant othe contact made: No. SPE not required for this pt as she did not endorse SI during admission or during stay at [facility].   Patient understands diagnosis: Yes, AEB seeking treatment for ETOH detox, cocaine abuse, and depression.  Discussing patient identified problems/goals with staff: Yes  Medical problems stabilized or resolved: Yes  Denies suicidal/homicidal ideation: Yes during assessment/intake/group. Patient has not harmed self or Others: Yes  New problem(s) identified: n/a  Discharge Plan or Barriers: Pt would like to be admitted into [facility] for treatment and will followup at [facility] for med management.  Additional comments: [name] is an 57 y.o. female presents voluntarily to [facility] stating that she wants detox from etoh. Pt is oriented x'4, calm and cooperative. Pt reports that "I drink a 1/5th of gin or more a day". Pt denies SI, HI, AVH, Delusions or Psychosis. Pt states that she is also struggling with cannabis and crack cocaine use "daily". Pt confirms the following depressive symptoms: hopeless, fatigue, guilt, loss of interest in usual pleasures, tearfulness, worthless and self-pity. Pt reports that "when I take my medication I sleep 8 hours and when I don't I only sleep about 3-4 hours". Pt said "I feel guilty that I'm in this situation again, trying to get off this stuff". Pt confirms that she has a long hx of sa tx and mh tx. Pt reports her etoh onset at 57 yo, nicotine onset at 57 yo (1 pk daily), cannabis onset 57 yo and cocaine onset at 57 yo. Pt reports her last use of all substances was "last night". Pt confirms that she is aware of the consequences of her using substances and said "yeah, I know". Pt confirms the following  etoh withdrawal symptoms: aggressive, nausea/vomiting, cramps in her stomach, irritability, diarrhea, weakness in her body, sweats, anorexia and a hx of blackouts. Pt denies any hx of seizures or medical problems that would interfere with her tx. Pt denies any current criminal charges or court dates. Pt reports her sa tx centers have been [facility], [facility] and [facility] (pt is unable to recall dates). Pt reports her mh tx with [facility] and currently [place]. Reason for Continuation of Hospitalization: Librium taper-withdrawals Mood stabilization Medication management Estimated length of stay: 3-5 days  For review of initial/current patient goals, please see plan of care.  Attendees:  Patient:    Family:    Physician: Geoffery Lyons  06/12/2013 10:57 AM   Nursing: Lupita Leash RN 06/12/2013 10:57 AM   Clinical Social Worker Milla Wahlberg Smart, LCSWA  06/12/2013 10:57 AM   Other: Philippa Chester RN 06/12/2013 10:57 AM   Other: Aggie PA 06/12/2013 10:57 AM   Other: Darden Dates Nurse CM 06/12/2013 10:57 AM   Other:    Scribe for Treatment Team:  [employer] LCSWA 06/12/2013 10:57 AM

## 2013-06-12 NOTE — Progress Notes (Signed)
Adult Psychoeducational Group Note  Date:  06/12/2013 Time:  8:56 PM  Group Topic/Focus:  NA group  Participation Level:  Active  Participation Quality:  Appropriate  Affect:  Appropriate  Cognitive:  Alert  Insight: Appropriate  Engagement in Group:  Engaged  Modes of Intervention:  Discussion  Additional Comments:    Flonnie Hailstone 06/12/2013, 8:56 PM

## 2013-06-12 NOTE — Progress Notes (Signed)
Adult Psychoeducational Group Note  Date:  06/12/2013 Time:  12:06 PM  Group Topic/Focus:  Making Healthy Choices:   The focus of this group is to help patients identify negative/unhealthy choices they were using prior to admission and identify positive/healthier coping strategies to replace them upon discharge.  Participation Level:  Did Not Attend  Participation Quality:  Did Not Attend  Affect:  Did Not Attend  Cognitive:  Did Not Attend  Insight: Did Not Attend  Engagement in Group:  Did Not Attend  Modes of Intervention:  Did Not Attend  Additional Comments:    Edmonia Caprio 06/12/2013, 12:06 PM

## 2013-06-12 NOTE — Progress Notes (Signed)
INITIAL NUTRITION ASSESSMENT  DOCUMENTATION CODES Per approved criteria  -Severe  malnutrition in the context of social or environmental circumstances   INTERVENTION: - Patient educated on the importance of adequate nutrition, and encouraged to eat meals and snacks as desired.   NUTRITION DIAGNOSIS: Unintentional weight loss related to alcohol dependence as evidenced by 12% weight loss in 2 months.   Goal: Patient will meet >/=90% of estimated nutrition needs  Monitor:  PO intake  Reason for Assessment: Malnutrition screening  57 y.o. female  ASSESSMENT: Patient with alcohol and cocaine abuse. She reports that her PO intake has been poor for the last 2 months due to alcohol and drug intake. She lost about 20 pounds or 12% of her usual body weight over that time. She reports that her intake has improved and her appetite is slowly coming back. She declines supplements at this time.   Patient meets the criteria for severe MALNUTRITION in the context of social/environmental issues with 12% weight loss in 2 months and PO intake <75% of estimated needs.   Height: Ht Readings from Last 1 Encounters:  06/11/13 5\' 7"  (1.702 m)   Weight: Wt Readings from Last 1 Encounters:  06/11/13 150 lb (68.04 kg)   Wt Readings from Last 10 Encounters:  06/11/13 150 lb (68.04 kg)    BMI:  There is no weight on file to calculate BMI.  Estimated Nutritional Needs: Kcal: 25-30 kcal/kg Protein: >1 g/kg Fluid: 1 ml/kcal  Diet Order: General  Labs and medications reviewed.  Linnell Fulling, RD, LDN Pager #: [phone] After-Hours Pager #: [phone]

## 2013-06-12 NOTE — Progress Notes (Signed)
Patient ID: [name], female   DOB: 06/01/1956, 57 y.o.   MRN: [id number]   D: Pt informed the writer that she is to be here for 5 days til she's transferred to [facility]. However, writer noticed that pt was smiling more today than during the adm process, even though she still forwarded lil info.   A:  Support and encouragement was offered. 15 min checks continued for safety.  R: Pt remains safe.

## 2013-06-12 NOTE — Progress Notes (Signed)
D: Patient denies SI/HI and A/V hallucinations; patient reports that she did not sleep very well and that she could not stay asleep  A: Monitored q 15 minutes; patient encouraged to attend groups; patient educated about medications; patient given medications per physician orders; patient encouraged to express feelings and/or concerns  R: Patient is quiet and cooperative; patient's interaction with staff and peers is appropriate but minimal; patient is taking medications as prescribed and tolerating medications; patient is attending some groups

## 2013-06-13 DIAGNOSIS — F3289 Other specified depressive episodes: Secondary | ICD-10-CM

## 2013-06-13 DIAGNOSIS — F329 Major depressive disorder, single episode, unspecified: Secondary | ICD-10-CM

## 2013-06-13 DIAGNOSIS — F411 Generalized anxiety disorder: Secondary | ICD-10-CM

## 2013-06-13 MED ORDER — ENSURE COMPLETE PO LIQD
237.0000 mL | Freq: Two times a day (BID) | ORAL | Status: DC
  Administered 2013-06-13 – 2013-06-14 (×3): 237 mL via ORAL

## 2013-06-13 NOTE — [facility] Group Notes (Signed)
[facility] LCSW Group Therapy  06/13/2013 3:06 PM  Type of Therapy:  Group Therapy  Participation Level:  Minimal  Participation Quality:  Drowsy  Affect:  Depressed and Lethargic  Cognitive:  Lacking  Insight:  Limited  Engagement in Therapy:  Limited  Modes of Intervention:  Discussion, Education, Exploration, Socialization and Support  Summary of Progress/Problems:  Finding Balance in Life. Today's group focused on defining balance in one's own words, identifying things that can knock one off balance, and exploring healthy ways to maintain balance in life. Group members were asked to provide an example of a time when they felt off balance, describe how they handled that situation,and process healthier ways to regain balance in the future. Group members were asked to share the most important tool for maintaining balance that they learned while at [facility] and how they plan to apply this method after discharge. [name] was inattentive and disengaged throughout today's group. She presented with lethargic affect and drifted in and out of sleep throughout group. [name] woke up at the end of group and stated that she planned to start exercising again in order to take better care of herself, get into a healthy routine, and practice self care. [name] shows limited progress in the group setting AEB her inability to stay awake due to "medications" at this time.    [name], Katherine Bassett 06/13/2013, 3:06 PM

## 2013-06-13 NOTE — Progress Notes (Signed)
D:  Patient up and visible in the milieu off and on throughout the day.  Has attended some groups, but is not participating fully.  She denies suicidal ideation.   A:  Medications given as per schedule.  Offered support and encouragement.  15 minute checks maintained for safety.   R:  Cooperative with staff.  Interacting some with peers.  Tolerating medications.  Safety is maintained on the unit.

## 2013-06-13 NOTE — Progress Notes (Signed)
[facility] MD Progress Note  06/13/2013 5:59 PM Katherine [name] [name]  MRN:  [id number] Subjective:  Admits that other than baby sitting her grandchild, she does not do anything but drink. She does not drink when the grand kid is in the house. She is wanting to start going to church again. Not sure what she needs to do past detox. She states that she has been doing for so long that she might need to go somewhere else past the Detox Diagnosis:   DSM5: Schizophrenia Disorders:   Obsessive-Compulsive Disorders:   Trauma-Stressor Disorders:   Substance/Addictive Disorders:  Alcohol Related Disorder - Severe (303.90), Cocaine Dependence Depressive Disorders:  Depressive disorder NOS  Axis I: Depressive Disorder NOS and Generalized Anxiety Disorder  ADL's:  Intact  Sleep: Fair  Appetite:  Fair  Suicidal Ideation:  Plan:  denies Intent:  denies Means:  denies Homicidal Ideation:  Plan:  denies Intent:  denies Means:  denies AEB (as evidenced by):  Psychiatric Specialty Exam: Review of Systems  Constitutional: Negative.   HENT: Negative.   Eyes: Negative.   Respiratory: Negative.   Cardiovascular: Negative.   Gastrointestinal: Negative.   Genitourinary: Negative.   Musculoskeletal: Negative.   Skin: Negative.   Neurological: Negative.   Endo/Heme/Allergies: Negative.   Psychiatric/Behavioral: Positive for depression and substance abuse. The patient is nervous/anxious.     Blood pressure 138/91, pulse 66, temperature 96.8 F (36 C), temperature source Oral, resp. rate 16.There is no weight on file to calculate BMI.  General Appearance: Fairly Groomed  Patent attorney::  Fair  Speech:  Clear and Coherent, Slow and not spontaneous  Volume:  Decreased  Mood:  Anxious and Depressed  Affect:  Restricted  Thought Process:  Coherent and Goal Directed  Orientation:  Full (Time, Place, and Person)  Thought Content:  worries, concerns  Suicidal Thoughts:  No  Homicidal Thoughts:  No  Memory:   Immediate;   Fair Recent;   Fair Remote;   Fair  Judgement:  Fair  Insight:  Present  Psychomotor Activity:  Restlessness  Concentration:  Fair  Recall:  Fair  Akathisia:  No  Handed:  Right  AIMS (if indicated):     Assets:  Desire for Improvement  Sleep:  Number of Hours: 6.75   Current Medications: Current Facility-Administered Medications  Medication Dose Route Frequency Provider Last Rate Last Dose  . acetaminophen (TYLENOL) tablet 650 mg  650 mg Oral Q6H PRN Rachael Fee, MD   650 mg at 06/13/13 0809  . albuterol (PROVENTIL HFA;VENTOLIN HFA) 108 (90 BASE) MCG/ACT inhaler 2 puff  2 puff Inhalation Q6H PRN Sanjuana Kava, NP      . alum & mag hydroxide-simeth (MAALOX/MYLANTA) 200-200-20 MG/5ML suspension 30 mL  30 mL Oral Q4H PRN Rachael Fee, MD      . chlordiazePOXIDE (LIBRIUM) capsule 25 mg  25 mg Oral Q6H PRN Rachael Fee, MD      . chlordiazePOXIDE (LIBRIUM) capsule 25 mg  25 mg Oral TID Rachael Fee, MD   25 mg at 06/13/13 1732   Followed by  . [START ON 06/14/2013] chlordiazePOXIDE (LIBRIUM) capsule 25 mg  25 mg Oral BH-qamhs Rachael Fee, MD       Followed by  . [START ON 06/16/2013] chlordiazePOXIDE (LIBRIUM) capsule 25 mg  25 mg Oral Daily Rachael Fee, MD      . feeding supplement (ENSURE COMPLETE) liquid 237 mL  237 mL Oral BID BM Rachael Fee, MD  237 mL at 06/13/13 1310  . FLUoxetine (PROZAC) capsule 40 mg  40 mg Oral Daily Sanjuana Kava, NP   40 mg at 06/13/13 0806  . gabapentin (NEURONTIN) capsule 100 mg  100 mg Oral TID Sanjuana Kava, NP   100 mg at 06/13/13 1731  . hydrOXYzine (ATARAX/VISTARIL) tablet 25 mg  25 mg Oral Q6H PRN Rachael Fee, MD      . loperamide (IMODIUM) capsule 2-4 mg  2-4 mg Oral PRN Rachael Fee, MD      . magnesium hydroxide (MILK OF MAGNESIA) suspension 30 mL  30 mL Oral Daily PRN Rachael Fee, MD      . multivitamin with minerals tablet 1 tablet  1 tablet Oral Daily Rachael Fee, MD   1 tablet at 06/13/13 [id number]  . nicotine  (NICODERM CQ - dosed in mg/24 hours) patch 21 mg  21 mg Transdermal Daily Rachael Fee, MD   21 mg at 06/13/13 0807  . ondansetron (ZOFRAN-ODT) disintegrating tablet 4 mg  4 mg Oral Q6H PRN Rachael Fee, MD      . risperiDONE (RISPERDAL) tablet 1 mg  1 mg Oral QHS Sanjuana Kava, NP   1 mg at 06/12/13 2201  . thiamine (VITAMIN B-1) tablet 100 mg  100 mg Oral Daily Rachael Fee, MD   100 mg at 06/13/13 0806  . traZODone (DESYREL) tablet 50 mg  50 mg Oral QHS,MR X 1 Rachael Fee, MD   50 mg at 06/12/13 2201    Lab Results:  Results for orders placed during the hospital encounter of 06/11/13 (from the past 48 hour(s))  MAGNESIUM     Status: None   Collection Time    06/12/13  6:05 AM      Result Value Range   Magnesium 2.0  1.5 - 2.5 mg/dL   Comment: Performed at [facility]  COMPREHENSIVE METABOLIC PANEL     Status: Abnormal   Collection Time    06/12/13  6:05 AM      Result Value Range   Sodium 139  135 - 145 mEq/L   Potassium 3.4 (*) 3.5 - 5.1 mEq/L   Chloride 101  96 - 112 mEq/L   CO2 29  19 - 32 mEq/L   Glucose, Bld 101 (*) 70 - 99 mg/dL   BUN 11  6 - 23 mg/dL   Creatinine, Ser 9.60  0.50 - 1.10 mg/dL   Calcium 9.3  8.4 - 45.4 mg/dL   Total Protein 5.9 (*) 6.0 - 8.3 g/dL   Albumin 3.0 (*) 3.5 - 5.2 g/dL   AST 19  0 - 37 U/L   ALT 16  0 - 35 U/L   Alkaline Phosphatase 81  39 - 117 U/L   Total Bilirubin 0.4  0.3 - 1.2 mg/dL   GFR calc non Af Amer 63 (*) >90 mL/min   GFR calc Af Amer 73 (*) >90 mL/min   Comment: (NOTE)     The eGFR has been calculated using the CKD EPI equation.     This calculation has not been validated in all clinical situations.     eGFR's persistently <90 mL/min signify possible Chronic Kidney     Disease.     Performed at [facility]    Physical Findings: AIMS: Facial and Oral Movements Muscles of Facial Expression: None, normal Lips and Perioral Area: None, normal Jaw: None, normal Tongue: None,  normal,Extremity Movements  Upper (arms, wrists, hands, fingers): None, normal Lower (legs, knees, ankles, toes): None, normal, Trunk Movements Neck, shoulders, hips: None, normal, Overall Severity Severity of abnormal movements (highest score from questions above): None, normal Incapacitation due to abnormal movements: None, normal Patient's awareness of abnormal movements (rate only patient's report): No Awareness, Dental Status Current problems with teeth and/or dentures?: Yes Does patient usually wear dentures?: No  CIWA:  CIWA-Ar Total: 0 COWS:     Treatment Plan Summary: Daily contact with patient to assess and evaluate symptoms and progress in treatment Medication management  Plan: Supportive approach/coping skills/relapse prevention           Pursue the detox           Optimize treatment with psychotropics  Medical Decision Making Problem Points:  Review of psycho-social stressors (1) Data Points:  Review of medication regiment & side effects (2)  I certify that inpatient services furnished can reasonably be expected to improve the patient's condition.   Daundre Biel A 06/13/2013, 5:59 PM

## 2013-06-13 NOTE — Progress Notes (Signed)
D   Pt is calm and cooperative  She is reserved and guarded and her interactions are minimal with other pts     She is treatment compliant and does attend groups A   Verbal support given   Medications administered and effectiveness monitored   Q 15 min checks R   Pt safe at present

## 2013-06-13 NOTE — Progress Notes (Signed)
Adult Psychoeducational Group Note  Date:  06/13/2013 Time:  12:41 PM  Group Topic/Focus:  Building Self Esteem:   The Focus of this group is helping patients become aware of the effects of self-esteem on their lives, the things they and others do that enhance or undermine their self-esteem, seeing the relationship between their level of self-esteem and the choices they make and learning ways to enhance self-esteem.  Participation Level:  None  Participation Quality:  Attentive  Affect:  Flat  Cognitive:  Pt did not speak in group.  Insight: None  Engagement in Group:  Pt did not speak in group.  Modes of Intervention:  Discussion, Socialization and Support  Additional Comments:  Pt came to group and left on several different occasions. Pt did not stay in group long and pt did not speak in group.  Cathlean Cower 06/13/2013, 12:41 PM

## 2013-06-13 NOTE — [facility] Suicide Risk Assessment (Signed)
[facility] INPATIENT: Family/Significant Other Suicide Prevention Education  Suicide Prevention Education:  Education Completed; No one has been identified by the patient as the family member/significant other with whom the patient will be residing, and identified as the person(s) who will aid the patient in the event of a mental health crisis (suicidal ideations/suicide attempt).   Pt did not c/o SI at admission, nor have they endorsed SI during their stay here. SPE not required. Pt given SPI pamphlet and encouraged to express concerns and ask questions.    [employer], LCSWA 06/13/2013 8:51 AM

## 2013-06-14 DIAGNOSIS — F10939 Alcohol use, unspecified with withdrawal, unspecified: Principal | ICD-10-CM

## 2013-06-14 DIAGNOSIS — F10239 Alcohol dependence with withdrawal, unspecified: Principal | ICD-10-CM

## 2013-06-14 DIAGNOSIS — F329 Major depressive disorder, single episode, unspecified: Secondary | ICD-10-CM

## 2013-06-14 MED ORDER — ALBUTEROL SULFATE HFA 108 (90 BASE) MCG/ACT IN AERS: 2.0000 | INHALATION_SPRAY | Freq: Four times a day (QID) | RESPIRATORY_TRACT | Status: DC | PRN

## 2013-06-14 MED ORDER — FLUOXETINE HCL 40 MG PO CAPS: 40.0000 mg | ORAL_CAPSULE | Freq: Every day | ORAL | Status: DC

## 2013-06-14 MED ORDER — GABAPENTIN 100 MG PO CAPS: 100.0000 mg | ORAL_CAPSULE | Freq: Three times a day (TID) | ORAL | Status: DC

## 2013-06-14 MED ORDER — ADULT MULTIVITAMIN W/MINERALS CH: 1.0000 | ORAL_TABLET | Freq: Every day | ORAL | Status: DC

## 2013-06-14 MED ORDER — RISPERIDONE 1 MG PO TABS: 1.0000 mg | ORAL_TABLET | Freq: Every day | ORAL | Status: DC

## 2013-06-14 MED ORDER — TRAZODONE HCL 50 MG PO TABS: 50.0000 mg | ORAL_TABLET | Freq: Every evening | ORAL | Status: DC | PRN

## 2013-06-14 NOTE — [facility] Suicide Risk Assessment (Signed)
Suicide Risk Assessment  Discharge Assessment     Demographic Factors:  Living alone and Unemployed  Mental Status Per Nursing Assessment::   On Admission:  NA  Current Mental Status by Physician: In full contact with reality. There are no suicidal ideas, plans or intent. Her mood is euthymic, her affect is restricted. She is exhibiting no S/S withdrawal. She has an appointment to be admitted to [facility] on Monday. She states she is committed to abstinence.   Loss Factors: NA  Historical Factors: NA  Risk Reduction Factors:   Responsible for children under 41 years of age, Sense of responsibility to family, Positive social support and grandkids  Continued Clinical Symptoms:  Depression:   Comorbid alcohol abuse/dependence Alcohol/Substance Abuse/Dependencies  Cognitive Features That Contribute To Risk:  Polarized thinking Thought constriction (tunnel vision)    Suicide Risk:  Minimal: No identifiable suicidal ideation.  Patients presenting with no risk factors but with morbid ruminations; may be classified as minimal risk based on the severity of the depressive symptoms  Discharge Diagnoses:   AXIS I:  Alcohol, Cocaine Dependence, Depressive Disorder NOS AXIS II:  Deferred AXIS III:   Past Medical History  Diagnosis Date  . Depression   . Migraine headache   . Chronic back pain   . Alcohol dependence 06/11/2013  . Asthma    AXIS IV:  other psychosocial or environmental problems AXIS V:  61-70 mild symptoms  Plan Of Care/Follow-up recommendations:  Activity:  as tolerated Diet:  regular Follow up [facility] Is patient on multiple antipsychotic therapies at discharge:  No   Has Patient had three or more failed trials of antipsychotic monotherapy by history:  No  Recommended Plan for Multiple Antipsychotic Therapies: NA  Nathania Waldman A 06/14/2013, 1:40 PM

## 2013-06-14 NOTE — Progress Notes (Signed)
Adult Psychoeducational Group Note  Date:  06/14/2013 Time:  1:37 PM  Group Topic/Focus:  Relapse Prevention Planning:   The focus of this group is to define relapse and discuss the need for planning to combat relapse.  Participation Level:  Minimal  Participation Quality:  Appropriate  Affect:  Flat  Cognitive:  Appropriate  Insight: Limited  Engagement in Group:  Limited  Modes of Intervention:  Education and Support  Additional Comments:  Pt attended group, but offered no contributions to group discussion. Pt participated by actively listening and filling out her "Relapse Prevention Plan" sheet.  Reinaldo Raddle K 06/14/2013, 1:37 PM

## 2013-06-14 NOTE — Progress Notes (Signed)
Recreation Therapy Notes  Date: 08.21.2014 Time: 3:0pm Location: [address] Dayroom  Group Topic: Communication, Team Building, Problem Solving  Goal Area(s) Addresses:  Patient will be able to recognize use of communication, team building and problem solving during course of group activity. Patient will verbalize need for communication, team building and problem solving to make group activity successful.  Patient will verbalize benefit of communication, team building and problem solving to post d/c goals.   Behavioral Response: Appropriate, Observer   Intervention: Problem Solving Activity  Activity: Landing Pad. Patients were given 12 plastic drinking straws and a length of masking tape. Working in teams of 2-3, using the provided materials patients were asked to build a freestanding landing pad that would catch a Audiological scientist soccer ball (equivlaent to size of a golf ball) dropped from approximately 4 feet in the air.    Education: Customer service manager, Discharge Planning, Relapse Prevention   Education Outcome: Acknowledges understanding  Clinical Observations/Feedback: Patient attended group session, but assume the role of observer. From time to time patient would offer suggestions to other group members for building their landing pad, but did not assist with construction of either teams landing pad. Patient was observed to smile while observing peer interaction and involvement in activity. Patient made no contributions to group discussion.   Marykay Lex Elinora Weigand, LRT/CTRS  Opie Fanton L 06/14/2013 8:16 AM

## 2013-06-14 NOTE — [facility] Group Notes (Signed)
[facility] LCSW Aftercare Discharge Planning Group Note   06/14/2013 9:42 AM  Participation Quality:  DID NOT ATTEND   Smart, [name]

## 2013-06-14 NOTE — Progress Notes (Signed)
Patient ID: [name], female   DOB: 02/15/1956, 57 y.o.   MRN: [id number] Patient is discharged per physician order; patient denies SI/HI and A/V hallucinations; patient received samples, prescriptions, and copy of AVS after it was reviewed; patient had no other questions or concerns at this time; patient left the unit ambulatory with her daughter

## 2013-06-14 NOTE — Progress Notes (Signed)
[facility] Adult Case Management Discharge Plan :  Will you be returning to the same living situation after discharge: No. At discharge, do you have transportation home?:Yes,  bus Do you have the ability to pay for your medications:Yes,  Medicaid  Release of information consent forms completed and in the chart;  Patient's signature needed at discharge.  Patient to Follow up at: Follow-up Information   Follow up with [facility]  On 06/19/2013. (Arrive by 8AM with ID, 30 day medication supply, and clothing. )    Contact information:   [address] [place], [place] [address] phone: [phone] fax: [phone]      Follow up with [facility]. (Walk in Monday through Friday between 8AM-9AM for hospital followup/medication management. )    Contact information:   [address], [place] [address] phone: [phone] fax: [phone]      Patient denies SI/HI:   Yes,  during admission/stay/self repor    Safety Planning and Suicide Prevention discussed:  Yes,  SPE not required for this pt. Pt provided with SPI pamphlet and encouraged to ask questions and talk about concerns relating to SPE.  Smart, Alexandra Posadas 06/14/2013, 11:08 AM

## 2013-06-14 NOTE — Discharge Summary (Signed)
Physician Discharge Summary Note  Patient:  [name] is an 57 y.o., female MRN:  [id number] DOB:  05/30/1956 Patient phone:  [phone] (home)  Patient address:   [address] [place] [address],   Date of Admission:  06/11/2013 Date of Discharge: 06/14/13  Reason for Admission:  Alcohol detox  Discharge Diagnoses: Active Problems:   Alcohol dependence   Cocaine dependence   Depression  Review of Systems  Constitutional: Negative.   HENT: Negative.   Eyes: Negative.   Respiratory: Negative.   Cardiovascular: Negative.   Gastrointestinal: Negative.   Genitourinary: Negative.   Musculoskeletal: Negative.   Skin: Negative.   Neurological: Negative.   Endo/Heme/Allergies: Negative.   Psychiatric/Behavioral: Positive for depression (Stabilized with medication prior to discharge) and substance abuse (Alcholism, Cocaine dependence). Negative for suicidal ideas, hallucinations and memory loss. The patient is nervous/anxious (Stabilized with medication prior to discharge) and has insomnia (Stabilized with medication prior to discharge).     DSM5: Schizophrenia Disorders:  NA Obsessive-Compulsive Disorders:  NA Trauma-Stressor Disorders:  NA Substance/Addictive Disorders:  Alcohol Withdrawal (291.81) Depressive Disorders:  Major Depressive Disorder - Moderate (296.22)  Axis Diagnosis:   AXIS I:  Alcohol withdrawal, Major depressive disorder, AXIS II:  Deferred AXIS III:   Past Medical History  Diagnosis Date  . Depression   . Migraine headache   . Chronic back pain   . Alcohol dependence 06/11/2013  . Asthma    AXIS IV:  other psychosocial or environmental problems and Chronic alcoholism AXIS V:  63  Level of Care:  [facility]  Hospital Course:  This is a 57 year old African-American female. Admitted to [facility] ED with complaints of alcohol intoxication, Cocaine dependence and Benzodiazepine abuse requesting detoxification  treatment. Patient reports, "My daughter took me to the hospital yesterday because I got tired of drinking too much liquor. I have been drinking heavily x 1 year. I drink Gin + beer, over a fifth of Gin and 6 packs of beer everyday. My drinking has gotten worse over the last couple of weeks. I drink a lot of alcohol because there is nothing else to do. My longest sobriety was about 2 years and that was 8 years ago. I can't remember why I relapsed that time. All I know is I woke up one morning and picked up drinking again. Alcohol has not actually caused me much of any problems, except that I get sick after getting drunk. I had been to [facility] and [facility] in the past, I drink daily, use cocaine daily and use Xanax every now and then"  Upon admission into this hospital, annd after admission assessment/evaluation including UDS/toxicology tests that showed alcohol of 97, (+) benzodiazepine and cocaine, it was determined that patient will need detoxification treatment to stabilize her system of drug/alcohol intoxication and to combat the withdrawal symptoms of these substances as well. However, cocaine so far has no established detoxification treatment protocol. And her discharge plans included a referral to a long term treatment facility [facility]) for a more intense substance abuse treatment. [name] was then started on Librium detoxification treatment protocol for her alcohol detoxification. She was also enrolled in group counseling sessions and activities to learn coping skills that should help her after discharge to cope better, manage her substance abuse problems to maintain a much longer sobriety. She also was enrolled and attended AA/NA meetings being offered and held on this unit. H She does have some previous  and or identifiable medical conditions that required treatment and or monitoring. She received medication management for all those health issues as well. She was monitored  closely for any potential problems that may arise as a result of and or during detoxification treatment. Patient tolerated her treatment regimen and detoxification treatment without any significant adverse effects and or reactions reported.  [name] also was ordered and received Fluoxetine 40 mg daily for depression, Gabapentin 100 mg tid for anxiety, Risperdal 1 mg at bedtime for mood control and Trazodone 50 mg Q bedtime for sleep. Patient attended treatment team meeting this am and met with the team staff. Her reason for admission, present symptoms, substance abuse issues, response to treatment and discharge plans discussed. Patient endorsed that she is doing well and stable for discharge to pursue the next phase of her substance abuse treatment. It was then decided and agreed upon that she will continue substance abuse treatment at the [facility] on 06/19/13 at 08:00 am. And for routine psychiatric care and medication management, she will follow-up at the [facility] clinic here in [place], [place]. She has been instructed and informed that this appointment with [facility] clinic is a walk-in appointment between the hours of 08:00 -09:00 am, Monday thru Friday. The addresses, dates, times and contact information for [facility] and [place] clinic provided for patient in writing.  Upon discharge, patient adamantly denies suicidal, homicidal ideations, auditory, visual hallucinations, delusional thinking and or withdrawal symptoms. Patient left [facility] with all personal belongings in no apparent distress. She received 2 weeks worth samples of her [facility] discharge medications. Transportation per [place] bus. Bus pass provided for patient by [facility].   Consults:  psychiatry  Significant Diagnostic Studies:  labs: CBC with diff, CMP, UDS, Toxicology tests, U/A  Discharge Vitals:   Blood pressure 134/85, pulse 84, temperature 98.1 F (36.7 C), temperature source Oral, resp. rate 24. There is no weight on file to  calculate BMI. Lab Results:   Results for orders placed during the hospital encounter of 06/11/13 (from the past 72 hour(s))  MAGNESIUM     Status: None   Collection Time    06/12/13  6:05 AM      Result Value Range   Magnesium 2.0  1.5 - 2.5 mg/dL   Comment: Performed at [facility]  COMPREHENSIVE METABOLIC PANEL     Status: Abnormal   Collection Time    06/12/13  6:05 AM      Result Value Range   Sodium 139  135 - 145 mEq/L   Potassium 3.4 (*) 3.5 - 5.1 mEq/L   Chloride 101  96 - 112 mEq/L   CO2 29  19 - 32 mEq/L   Glucose, Bld 101 (*) 70 - 99 mg/dL   BUN 11  6 - 23 mg/dL   Creatinine, Ser 1.61  0.50 - 1.10 mg/dL   Calcium 9.3  8.4 - 09.6 mg/dL   Total Protein 5.9 (*) 6.0 - 8.3 g/dL   Albumin 3.0 (*) 3.5 - 5.2 g/dL   AST 19  0 - 37 U/L   ALT 16  0 - 35 U/L   Alkaline Phosphatase 81  39 - 117 U/L   Total Bilirubin 0.4  0.3 - 1.2 mg/dL   GFR calc non Af Amer 63 (*) >90 mL/min   GFR calc Af Amer 73 (*) >90 mL/min   Comment: (NOTE)     The eGFR has been calculated using the CKD EPI equation.     This  calculation has not been validated in all clinical situations.     eGFR's persistently <90 mL/min signify possible Chronic Kidney     Disease.     Performed at [facility]    Physical Findings: AIMS: Facial and Oral Movements Muscles of Facial Expression: None, normal Lips and Perioral Area: None, normal Jaw: None, normal Tongue: None, normal,Extremity Movements Upper (arms, wrists, hands, fingers): None, normal Lower (legs, knees, ankles, toes): None, normal, Trunk Movements Neck, shoulders, hips: None, normal, Overall Severity Severity of abnormal movements (highest score from questions above): None, normal Incapacitation due to abnormal movements: None, normal Patient's awareness of abnormal movements (rate only patient's report): No Awareness, Dental Status Current problems with teeth and/or dentures?: Yes Does patient usually wear  dentures?: No  CIWA:  CIWA-Ar Total: 0 COWS:     Psychiatric Specialty Exam: See Psychiatric Specialty Exam and Suicide Risk Assessment completed by Attending Physician prior to discharge.  Discharge destination:  Other:  Home, then to [facility] on 06/19/13  Is patient on multiple antipsychotic therapies at discharge:  No   Has Patient had three or more failed trials of antipsychotic monotherapy by history:  No  Recommended Plan for Multiple Antipsychotic Therapies: NA      Discharge Orders   Future Orders Complete By Expires   Discharge instructions  As directed    Comments:     Take all your medicines as prescribed. Report any adverse effects and or reactions from your medicines to your outpatient provider promptly. Keep all scheduled follow-up appointments as recommended. Follow-up with your primary care provider your other medical issues and or concerns. Do not drink alcohol beverages and or engage in illegal drug use while on prescription medicines. In the event of worsening symptoms, call the crisis hot-line, 911 and or go to the nearest ED for evaluation/traetment of symptoms.       Medication List       Indication   albuterol 108 (90 BASE) MCG/ACT inhaler  Commonly known as:  PROVENTIL HFA;VENTOLIN HFA  Inhale 2 puffs into the lungs every 6 (six) hours as needed for wheezing.   Indication:  Asthma     FLUoxetine 40 MG capsule  Commonly known as:  PROZAC  Take 1 capsule (40 mg total) by mouth daily. For depression   Indication:  Major Depressive Disorder     gabapentin 100 MG capsule  Commonly known as:  NEURONTIN  Take 1 capsule (100 mg total) by mouth 3 (three) times daily. For anxiety/pain management   Indication:  Agitation, Pain, Anxiety     multivitamin with minerals Tabs tablet  Take 1 tablet by mouth daily. For: Low Vitamin supplement   Indication:  Low vitamin     risperiDONE 1 MG tablet  Commonly known as:  RISPERDAL  Take 1 tablet (1  mg total) by mouth at bedtime. For mood control   Indication:  Mood control     traZODone 50 MG tablet  Commonly known as:  DESYREL  Take 1 tablet (50 mg total) by mouth at bedtime and may repeat dose one time if needed. For sleep   Indication:  Trouble Sleeping       Follow-up Information   Follow up with [facility]  On 06/19/2013. (Arrive by 8AM with ID, 30 day medication supply, and clothing. )    Contact information:   [address] [place], [place] [address] phone: [phone] fax: [phone]      Follow up with [facility]. (  Walk in Monday through Friday between 8AM-9AM for hospital followup/medication management. )    Contact information:   [address], [place] [address] phone: [phone] fax: [phone]     Follow-up recommendations:  Activity:  As tolerated Diet: As recommended by your primary care doctor. Keep all scheduled follow-up appointments as recommended. Continue to work your relapse prevention plan Comments: Take all your medications as prescribed by your mental healthcare provider. Report any adverse effects and or reactions from your medicines to your outpatient provider promptly. Patient is instructed and cautioned to not engage in alcohol and or illegal drug use while on prescription medicines. In the event of worsening symptoms, patient is instructed to call the crisis hotline, 911 and or go to the nearest ED for appropriate evaluation and treatment of symptoms. Follow-up with your primary care provider for your other medical issues, concerns and or health care needs.    Total Discharge Time:  Greater than 30 minutes.  Signed: Sanjuana Kava, PMHNP-BC 06/14/2013, 11:07 AM Agree with assessment and plan Reymundo Poll. Dub Mikes, M.D.

## 2013-06-14 NOTE — [facility] Group Notes (Signed)
[facility] LCSW Group Therapy  06/14/2013 1:57 PM  Type of Therapy:  Group Therapy  Participation Level:  Minimal  Participation Quality:  Attentive  Affect:  Depressed  Cognitive:  Alert  Insight:  Limited  Engagement in Therapy:  Limited  Modes of Intervention:  Discussion, Education, Exploration, Socialization and Support  Summary of Progress/Problems: Feelings around Relapse. Group members discussed the meaning of relapse and shared personal stories of relapse, how it affected them and others, and how they perceived themselves during this time. Group members were encouraged to identify triggers, warning signs and coping skills used when facing the possibility of relapse. Social supports were discussed and explored in detail. Post Acute Withdrawal Syndrome (handout provided) was introduced and examined. Pt's were encouraged to ask questions, talk about key points associated with PAWS, and process this information in terms of relapse prevention. [name] was attentive during group but did not actively participate. She followed along as CSW read information about PAWS. [name] shows limited progress and insight in the group setting AEB her resistance to active participation.    Smart, [name] 06/14/2013, 1:57 PM

## 2013-06-18 NOTE — Progress Notes (Signed)
Patient Discharge Instructions:  After Visit Summary (AVS):   Faxed to:  06/18/13 Discharge Summary Note:   Faxed to:  06/18/13 Psychiatric Admission Assessment Note:   Faxed to:  06/18/13 Suicide Risk Assessment - Discharge Assessment:   Faxed to:  06/18/13 Faxed/Sent to the Next Level Care provider:  06/18/13 Faxed to [facility] @ [phone] Faxed to [facility] @ [phone]   Jerelene Redden, 06/18/2013, 4:20 PM

## 2013-06-26 ENCOUNTER — Encounter (HOSPITAL_BASED_OUTPATIENT_CLINIC_OR_DEPARTMENT_OTHER): Payer: Self-pay | Admitting: Emergency Medicine

## 2013-06-26 ENCOUNTER — Emergency Department (HOSPITAL_BASED_OUTPATIENT_CLINIC_OR_DEPARTMENT_OTHER): Payer: Medicare Other

## 2013-06-26 ENCOUNTER — Emergency Department (HOSPITAL_BASED_OUTPATIENT_CLINIC_OR_DEPARTMENT_OTHER)
Admission: EM | Admit: 2013-06-26 | Discharge: 2013-06-26 | Disposition: A | Discharge status: Home / Self Care | Payer: Medicare Other | Attending: Emergency Medicine | Admitting: Emergency Medicine

## 2013-06-26 DIAGNOSIS — F329 Major depressive disorder, single episode, unspecified: Secondary | ICD-10-CM | POA: Insufficient documentation

## 2013-06-26 DIAGNOSIS — M549 Dorsalgia, unspecified: Secondary | ICD-10-CM | POA: Insufficient documentation

## 2013-06-26 DIAGNOSIS — F17200 Nicotine dependence, unspecified, uncomplicated: Secondary | ICD-10-CM | POA: Insufficient documentation

## 2013-06-26 DIAGNOSIS — F3289 Other specified depressive episodes: Secondary | ICD-10-CM | POA: Insufficient documentation

## 2013-06-26 DIAGNOSIS — Z79899 Other long term (current) drug therapy: Secondary | ICD-10-CM | POA: Insufficient documentation

## 2013-06-26 DIAGNOSIS — G43909 Migraine, unspecified, not intractable, without status migrainosus: Secondary | ICD-10-CM | POA: Insufficient documentation

## 2013-06-26 DIAGNOSIS — R51 Headache: Secondary | ICD-10-CM | POA: Insufficient documentation

## 2013-06-26 DIAGNOSIS — J449 Chronic obstructive pulmonary disease, unspecified: Secondary | ICD-10-CM | POA: Insufficient documentation

## 2013-06-26 DIAGNOSIS — J029 Acute pharyngitis, unspecified: Secondary | ICD-10-CM | POA: Insufficient documentation

## 2013-06-26 DIAGNOSIS — G8929 Other chronic pain: Secondary | ICD-10-CM | POA: Insufficient documentation

## 2013-06-26 DIAGNOSIS — J4489 Other specified chronic obstructive pulmonary disease: Secondary | ICD-10-CM | POA: Insufficient documentation

## 2013-06-26 DIAGNOSIS — J40 Bronchitis, not specified as acute or chronic: Secondary | ICD-10-CM

## 2013-06-26 DIAGNOSIS — R5381 Other malaise: Secondary | ICD-10-CM | POA: Insufficient documentation

## 2013-06-26 DIAGNOSIS — R52 Pain, unspecified: Secondary | ICD-10-CM | POA: Insufficient documentation

## 2013-06-26 MED ORDER — IPRATROPIUM BROMIDE 0.02 % IN SOLN: 0.5000 mg | Freq: Once | RESPIRATORY_TRACT | Status: AC

## 2013-06-26 MED ORDER — AZITHROMYCIN 250 MG PO TABS: 250.0000 mg | ORAL_TABLET | Freq: Every day | ORAL | Status: DC

## 2013-06-26 MED ORDER — ALBUTEROL SULFATE (5 MG/ML) 0.5% IN NEBU
INHALATION_SOLUTION | RESPIRATORY_TRACT | Status: AC
  Administered 2013-06-26: 5 mg via RESPIRATORY_TRACT
  Filled 2013-06-26: qty 1

## 2013-06-26 MED ORDER — ALBUTEROL SULFATE HFA 108 (90 BASE) MCG/ACT IN AERS: 2.0000 | INHALATION_SPRAY | Freq: Four times a day (QID) | RESPIRATORY_TRACT | Status: DC | PRN

## 2013-06-26 MED ORDER — IPRATROPIUM BROMIDE 0.02 % IN SOLN
RESPIRATORY_TRACT | Status: AC
  Administered 2013-06-26: 0.5 mg via RESPIRATORY_TRACT
  Filled 2013-06-26: qty 2.5

## 2013-06-26 MED ORDER — ALBUTEROL SULFATE (5 MG/ML) 0.5% IN NEBU: 5.0000 mg | INHALATION_SOLUTION | Freq: Once | RESPIRATORY_TRACT | Status: AC

## 2013-06-26 NOTE — ED Notes (Signed)
Cough, sore throat, HA, body aches, and fatigue since yesterday. Brought for eval from [facility].

## 2013-06-26 NOTE — ED Provider Notes (Signed)
Medical screening examination/treatment/procedure(s) were performed by non-physician practitioner and as supervising physician I was immediately available for consultation/collaboration.  Madelaine Whipple, MD 06/26/13 1508 

## 2013-06-26 NOTE — ED Provider Notes (Signed)
CSN: [id number]     Arrival date & time 06/26/13  1310 History   First MD Initiated Contact with Patient 06/26/13 1400     Chief Complaint  Patient presents with  . Cough  . Sore Throat  . Headache  . Generalized Body Aches  . Fatigue   (Consider location/radiation/quality/duration/timing/severity/associated sxs/prior Treatment) Patient is a 57 y.o. female presenting with cough, pharyngitis, and headaches. The history is provided by the patient.  Cough Cough characteristics:  Productive Sputum characteristics: 2. Severity:  Moderate Onset quality:  Gradual Duration:  2 days Timing:  Constant Progression:  Worsening Chronicity:  New Smoker: yes   Relieved by:  Nothing Worsened by:  Nothing tried Ineffective treatments:  None tried Associated symptoms: headaches   Sore Throat Associated symptoms include coughing and headaches.  Headache Associated symptoms: cough     Past Medical History  Diagnosis Date  . Depression   . Migraine headache   . Chronic back pain   . Alcohol dependence 06/11/2013  . Asthma    Past Surgical History  Procedure Laterality Date  . No past surgeries     No family history on file. History  Substance Use Topics  . Smoking status: Current Every Day Smoker -- 1.00 packs/day for 35 years    Types: Cigarettes  . Smokeless tobacco: Not on file  . Alcohol Use: 0.0 oz/week     Comment: fifth liquor daily - At [facility] now to stop drinking   OB History   Grav Para Term Preterm Abortions TAB SAB Ect Mult Living                 Review of Systems  Respiratory: Positive for cough.   Neurological: Positive for headaches.  All other systems reviewed and are negative.    Allergies  Review of patient's allergies indicates no known allergies.  Home Medications   Current Outpatient Rx  Name  Route  Sig  Dispense  Refill  . albuterol (PROVENTIL HFA;VENTOLIN HFA) 108 (90 BASE) MCG/ACT inhaler   Inhalation   Inhale 2 puffs into the lungs every  6 (six) hours as needed for wheezing.         Marland Kitchen FLUoxetine (PROZAC) 40 MG capsule   Oral   Take 1 capsule (40 mg total) by mouth daily. For depression   30 capsule   0   . gabapentin (NEURONTIN) 100 MG capsule   Oral   Take 1 capsule (100 mg total) by mouth 3 (three) times daily. For anxiety/pain management   90 capsule   0   . Multiple Vitamin (MULTIVITAMIN WITH MINERALS) TABS tablet   Oral   Take 1 tablet by mouth daily. For: Low Vitamin supplement         . risperiDONE (RISPERDAL) 1 MG tablet   Oral   Take 1 tablet (1 mg total) by mouth at bedtime. For mood control   30 tablet   0   . traZODone (DESYREL) 50 MG tablet   Oral   Take 1 tablet (50 mg total) by mouth at bedtime and may repeat dose one time if needed. For sleep   60 tablet   0    BP 140/89  Pulse 83  Temp(Src) 99.1 F (37.3 C) (Oral)  Resp 20  Ht 5\' 7"  (1.702 m)  Wt 170 lb (77.111 kg)  BMI 26.62 kg/m2  SpO2 96% Physical Exam  Nursing note and vitals reviewed. Constitutional: She is oriented to person, place, and time. She appears well-developed  and well-nourished.  HENT:  Head: Normocephalic and atraumatic.  Eyes: Conjunctivae and EOM are normal. Pupils are equal, round, and reactive to light.  Neck: Normal range of motion. Neck supple.  Cardiovascular: Normal rate.   Pulmonary/Chest: She has wheezes.  Abdominal: Soft. Bowel sounds are normal.  Musculoskeletal: Normal range of motion.  Neurological: She is alert and oriented to person, place, and time. She has normal reflexes.  Skin: Skin is warm.  Psychiatric: She has a normal mood and affect.    ED Course  Procedures (including critical care time) Labs Review Labs Reviewed  RAPID STREP SCREEN  CULTURE, GROUP A STREP   Imaging Review Dg Chest 2 View  06/26/2013   *RADIOLOGY REPORT*  Clinical Data: Cough  CHEST - 2 VIEW  Comparison: 03/01/2013  Findings: Normal heart size and vascularity.  No CHF or definite pneumonia.  Stable chronic  bronchitic changes centrally.  No effusion or pneumothorax.  Trachea is midline.  IMPRESSION: Chronic bronchitic changes.  No superimposed CHF or pneumonia.   Original Report Authenticated By: Judie Petit. Miles Costain, M.D.    MDM   1. Bronchitis    No pneumonia,   Pt given albuterol and atrovent.  Pt given rx for albuterol and zithromax.     Lonia Skinner [place], PA-C 06/26/13 1459

## 2013-06-28 LAB — CULTURE, GROUP A STREP

## 2014-02-17 ENCOUNTER — Inpatient Hospital Stay (HOSPITAL_COMMUNITY): Payer: Medicare Other

## 2014-02-17 ENCOUNTER — Inpatient Hospital Stay (HOSPITAL_COMMUNITY)
Admission: EM | Admit: 2014-02-17 | Discharge: 2014-02-21 | DRG: 392 | Disposition: A | Discharge status: Home / Self Care | Payer: Medicare Other | Attending: Internal Medicine | Admitting: Internal Medicine

## 2014-02-17 ENCOUNTER — Encounter (HOSPITAL_COMMUNITY): Payer: Self-pay | Admitting: Emergency Medicine

## 2014-02-17 DIAGNOSIS — F411 Generalized anxiety disorder: Secondary | ICD-10-CM | POA: Diagnosis present

## 2014-02-17 DIAGNOSIS — R109 Unspecified abdominal pain: Secondary | ICD-10-CM

## 2014-02-17 DIAGNOSIS — J449 Chronic obstructive pulmonary disease, unspecified: Secondary | ICD-10-CM

## 2014-02-17 DIAGNOSIS — M549 Dorsalgia, unspecified: Secondary | ICD-10-CM

## 2014-02-17 DIAGNOSIS — R259 Unspecified abnormal involuntary movements: Secondary | ICD-10-CM | POA: Diagnosis present

## 2014-02-17 DIAGNOSIS — F17200 Nicotine dependence, unspecified, uncomplicated: Secondary | ICD-10-CM | POA: Diagnosis present

## 2014-02-17 DIAGNOSIS — F10239 Alcohol dependence with withdrawal, unspecified: Secondary | ICD-10-CM | POA: Diagnosis present

## 2014-02-17 DIAGNOSIS — W19XXXA Unspecified fall, initial encounter: Secondary | ICD-10-CM | POA: Diagnosis not present

## 2014-02-17 DIAGNOSIS — F32A Depression, unspecified: Secondary | ICD-10-CM | POA: Diagnosis present

## 2014-02-17 DIAGNOSIS — K2920 Alcoholic gastritis without bleeding: Principal | ICD-10-CM | POA: Diagnosis present

## 2014-02-17 DIAGNOSIS — G8929 Other chronic pain: Secondary | ICD-10-CM

## 2014-02-17 DIAGNOSIS — F329 Major depressive disorder, single episode, unspecified: Secondary | ICD-10-CM | POA: Diagnosis present

## 2014-02-17 DIAGNOSIS — F1020 Alcohol dependence, uncomplicated: Secondary | ICD-10-CM

## 2014-02-17 DIAGNOSIS — E860 Dehydration: Secondary | ICD-10-CM

## 2014-02-17 DIAGNOSIS — F10939 Alcohol use, unspecified with withdrawal, unspecified: Secondary | ICD-10-CM | POA: Diagnosis present

## 2014-02-17 DIAGNOSIS — F1420 Cocaine dependence, uncomplicated: Secondary | ICD-10-CM

## 2014-02-17 DIAGNOSIS — Y9210 Unspecified residential institution as the place of occurrence of the external cause: Secondary | ICD-10-CM | POA: Diagnosis not present

## 2014-02-17 DIAGNOSIS — J4489 Other specified chronic obstructive pulmonary disease: Secondary | ICD-10-CM

## 2014-02-17 DIAGNOSIS — F3289 Other specified depressive episodes: Secondary | ICD-10-CM | POA: Diagnosis present

## 2014-02-17 LAB — I-STAT TROPONIN, ED: Troponin i, poc: 0 ng/mL (ref 0.00–0.08)

## 2014-02-17 LAB — COMPREHENSIVE METABOLIC PANEL
ALBUMIN: 4.1 g/dL (ref 3.5–5.2)
ALK PHOS: 113 U/L (ref 39–117)
ALT: 20 U/L (ref 0–35)
AST: 23 U/L (ref 0–37)
BILIRUBIN TOTAL: 1.2 mg/dL (ref 0.3–1.2)
BUN: 8 mg/dL (ref 6–23)
CO2: 24 mEq/L (ref 19–32)
Calcium: 10 mg/dL (ref 8.4–10.5)
Chloride: 97 mEq/L (ref 96–112)
Creatinine, Ser: 0.86 mg/dL (ref 0.50–1.10)
GFR calc Af Amer: 85 mL/min — ABNORMAL LOW (ref >90)
GFR, EST NON AFRICAN AMERICAN: 74 mL/min — AB (ref >90)
Glucose, Bld: 123 mg/dL — ABNORMAL HIGH (ref 70–99)
POTASSIUM: 3.4 meq/L — AB (ref 3.7–5.3)
Sodium: 140 mEq/L (ref 137–147)
Total Protein: 7.7 g/dL (ref 6.0–8.3)

## 2014-02-17 LAB — CBC WITH DIFFERENTIAL/PLATELET
BASOS ABS: 0 10*3/uL (ref 0.0–0.1)
Basophils Relative: 0 % (ref 0–1)
EOS ABS: 0.1 10*3/uL (ref 0.0–0.7)
EOS PCT: 1 % (ref 0–5)
HEMATOCRIT: 47.3 % — AB (ref 36.0–46.0)
Hemoglobin: 16.4 g/dL — ABNORMAL HIGH (ref 12.0–15.0)
Lymphocytes Relative: 35 % (ref 12–46)
Lymphs Abs: 2.3 10*3/uL (ref 0.7–4.0)
MCH: 30.1 pg (ref 26.0–34.0)
MCHC: 34.7 g/dL (ref 30.0–36.0)
MCV: 86.9 fL (ref 78.0–100.0)
MONO ABS: 0.5 10*3/uL (ref 0.1–1.0)
Monocytes Relative: 7 % (ref 3–12)
Neutro Abs: 3.7 10*3/uL (ref 1.7–7.7)
Neutrophils Relative %: 57 % (ref 43–77)
PLATELETS: 332 10*3/uL (ref 150–400)
RBC: 5.44 MIL/uL — ABNORMAL HIGH (ref 3.87–5.11)
RDW: 12.7 % (ref 11.5–15.5)
WBC: 6.6 10*3/uL (ref 4.0–10.5)

## 2014-02-17 LAB — RAPID URINE DRUG SCREEN, HOSP PERFORMED
Amphetamines: NOT DETECTED
BENZODIAZEPINES: NOT DETECTED
Barbiturates: NOT DETECTED
Cocaine: POSITIVE — AB
OPIATES: NOT DETECTED
Tetrahydrocannabinol: NOT DETECTED

## 2014-02-17 LAB — URINALYSIS, ROUTINE W REFLEX MICROSCOPIC
GLUCOSE, UA: NEGATIVE mg/dL
Ketones, ur: 15 mg/dL — AB
Leukocytes, UA: NEGATIVE
Nitrite: NEGATIVE
Protein, ur: 100 mg/dL — AB
Specific Gravity, Urine: 1.019 (ref 1.005–1.030)
Urobilinogen, UA: 1 mg/dL (ref 0.0–1.0)
pH: 7 (ref 5.0–8.0)

## 2014-02-17 LAB — URINE MICROSCOPIC-ADD ON

## 2014-02-17 LAB — LIPASE, BLOOD: Lipase: 42 U/L (ref 11–59)

## 2014-02-17 LAB — ETHANOL: Alcohol, Ethyl (B): <11 mg/dL (ref 0–11)

## 2014-02-17 MED ORDER — ONDANSETRON HCL 4 MG/2ML IJ SOLN: 4.0000 mg | Freq: Four times a day (QID) | INTRAMUSCULAR | Status: DC | PRN

## 2014-02-17 MED ORDER — MORPHINE SULFATE 4 MG/ML IJ SOLN: 4.0000 mg | Freq: Once | INTRAMUSCULAR | Status: DC

## 2014-02-17 MED ORDER — SODIUM CHLORIDE 0.9 % IV SOLN
INTRAVENOUS | Status: DC
  Administered 2014-02-17: 125 mL/h via INTRAVENOUS

## 2014-02-17 MED ORDER — RISPERIDONE 1 MG PO TABS
1.0000 mg | ORAL_TABLET | Freq: Every day | ORAL | Status: DC
  Administered 2014-02-17 – 2014-02-20 (×4): 1 mg via ORAL
  Filled 2014-02-17 (×6): qty 1

## 2014-02-17 MED ORDER — GABAPENTIN 100 MG PO CAPS
100.0000 mg | ORAL_CAPSULE | Freq: Three times a day (TID) | ORAL | Status: DC
  Administered 2014-02-17 – 2014-02-21 (×11): 100 mg via ORAL
  Filled 2014-02-17 (×14): qty 1

## 2014-02-17 MED ORDER — WHITE PETROLATUM GEL
Status: DC | PRN
  Administered 2014-02-17: 0.2 via TOPICAL
  Filled 2014-02-17: qty 5

## 2014-02-17 MED ORDER — ACETAMINOPHEN 650 MG RE SUPP: 650.0000 mg | Freq: Four times a day (QID) | RECTAL | Status: DC | PRN

## 2014-02-17 MED ORDER — VITAMIN B-1 100 MG PO TABS
100.0000 mg | ORAL_TABLET | Freq: Every day | ORAL | Status: DC
  Administered 2014-02-18 – 2014-02-21 (×4): 100 mg via ORAL
  Filled 2014-02-17 (×4): qty 1

## 2014-02-17 MED ORDER — NICOTINE 21 MG/24HR TD PT24
21.0000 mg | MEDICATED_PATCH | Freq: Every day | TRANSDERMAL | Status: DC
  Administered 2014-02-17 – 2014-02-21 (×5): 21 mg via TRANSDERMAL
  Filled 2014-02-17 (×5): qty 1

## 2014-02-17 MED ORDER — IPRATROPIUM-ALBUTEROL 0.5-2.5 (3) MG/3ML IN SOLN: 3.0000 mL | Freq: Four times a day (QID) | RESPIRATORY_TRACT | Status: DC | PRN

## 2014-02-17 MED ORDER — HYDROMORPHONE HCL PF 1 MG/ML IJ SOLN: 1.0000 mg | INTRAMUSCULAR | Status: DC | PRN

## 2014-02-17 MED ORDER — SODIUM CHLORIDE 0.9 % IJ SOLN
3.0000 mL | Freq: Two times a day (BID) | INTRAMUSCULAR | Status: DC
  Administered 2014-02-20 – 2014-02-21 (×2): 3 mL via INTRAVENOUS

## 2014-02-17 MED ORDER — PANTOPRAZOLE SODIUM 40 MG IV SOLR
40.0000 mg | Freq: Two times a day (BID) | INTRAVENOUS | Status: DC
  Administered 2014-02-17 – 2014-02-18 (×3): 40 mg via INTRAVENOUS
  Filled 2014-02-17 (×6): qty 40

## 2014-02-17 MED ORDER — ACETAMINOPHEN 325 MG PO TABS: 650.0000 mg | ORAL_TABLET | Freq: Four times a day (QID) | ORAL | Status: DC | PRN

## 2014-02-17 MED ORDER — THIAMINE HCL 100 MG/ML IJ SOLN
100.0000 mg | Freq: Every day | INTRAMUSCULAR | Status: DC
  Administered 2014-02-17: 100 mg via INTRAVENOUS
  Filled 2014-02-17: qty 1
  Filled 2014-02-17: qty 2
  Filled 2014-02-17: qty 1

## 2014-02-17 MED ORDER — ONDANSETRON HCL 4 MG PO TABS: 4.0000 mg | ORAL_TABLET | Freq: Four times a day (QID) | ORAL | Status: DC | PRN

## 2014-02-17 MED ORDER — SODIUM CHLORIDE 0.9 % IV SOLN
INTRAVENOUS | Status: DC
Start: 2014-02-17 — End: 2014-02-21
  Administered 2014-02-18 – 2014-02-20 (×6): via INTRAVENOUS
  Administered 2014-02-21: 250 mL via INTRAVENOUS

## 2014-02-17 MED ORDER — LORAZEPAM 1 MG PO TABS
0.0000 mg | ORAL_TABLET | Freq: Four times a day (QID) | ORAL | Status: AC
  Administered 2014-02-17 – 2014-02-18 (×3): 1 mg via ORAL
  Filled 2014-02-17 (×3): qty 1

## 2014-02-17 MED ORDER — IOHEXOL 300 MG/ML  SOLN
25.0000 mL | INTRAMUSCULAR | Status: AC
  Administered 2014-02-17: 25 mL via ORAL

## 2014-02-17 MED ORDER — LORAZEPAM 2 MG/ML IJ SOLN
0.0000 mg | Freq: Two times a day (BID) | INTRAMUSCULAR | Status: AC
  Administered 2014-02-17: 2 mg via INTRAVENOUS
  Filled 2014-02-17 (×2): qty 1

## 2014-02-17 MED ORDER — SUCRALFATE 1 GM/10ML PO SUSP
1.0000 g | Freq: Three times a day (TID) | ORAL | Status: DC
  Administered 2014-02-17 – 2014-02-21 (×15): 1 g via ORAL
  Filled 2014-02-17 (×20): qty 10

## 2014-02-17 MED ORDER — LORAZEPAM 2 MG/ML IJ SOLN
1.0000 mg | Freq: Once | INTRAMUSCULAR | Status: AC
  Administered 2014-02-17: 1 mg via INTRAVENOUS
  Filled 2014-02-17: qty 1

## 2014-02-17 MED ORDER — HYDROCODONE-ACETAMINOPHEN 5-325 MG PO TABS: 1.0000 | ORAL_TABLET | ORAL | Status: DC | PRN

## 2014-02-17 MED ORDER — IOHEXOL 300 MG/ML  SOLN
100.0000 mL | Freq: Once | INTRAMUSCULAR | Status: AC | PRN
  Administered 2014-02-17: 100 mL via INTRAVENOUS

## 2014-02-17 MED ORDER — SODIUM CHLORIDE 0.9 % IV BOLUS (SEPSIS): 1000.0000 mL | Freq: Once | INTRAVENOUS | Status: DC

## 2014-02-17 MED ORDER — SODIUM CHLORIDE 0.9 % IV BOLUS (SEPSIS)
1000.0000 mL | Freq: Once | INTRAVENOUS | Status: AC
  Administered 2014-02-17: 1000 mL via INTRAVENOUS

## 2014-02-17 MED ORDER — LORAZEPAM 1 MG PO TABS: 0.0000 mg | ORAL_TABLET | Freq: Two times a day (BID) | ORAL | Status: DC

## 2014-02-17 MED ORDER — FLUOXETINE HCL 20 MG PO CAPS
40.0000 mg | ORAL_CAPSULE | Freq: Every day | ORAL | Status: DC
Start: 2014-02-17 — End: 2014-02-21
  Administered 2014-02-17 – 2014-02-21 (×5): 40 mg via ORAL
  Filled 2014-02-17 (×5): qty 2

## 2014-02-17 MED ORDER — LORAZEPAM 2 MG/ML IJ SOLN
0.0000 mg | Freq: Four times a day (QID) | INTRAMUSCULAR | Status: AC
  Administered 2014-02-17: 2 mg via INTRAVENOUS

## 2014-02-17 MED ORDER — ENOXAPARIN SODIUM 40 MG/0.4ML ~~LOC~~ SOLN
40.0000 mg | SUBCUTANEOUS | Status: DC
  Administered 2014-02-18 – 2014-02-20 (×3): 40 mg via SUBCUTANEOUS
  Filled 2014-02-17 (×6): qty 0.4

## 2014-02-17 NOTE — ED Provider Notes (Signed)
CSN: [id number]     Arrival date & time 02/17/14  1127 History   First MD Initiated Contact with Patient 02/17/14 1219     Chief Complaint  Patient presents with  . Abdominal Pain  . Withdrawal     (Consider location/radiation/quality/duration/timing/severity/associated sxs/prior Treatment) HPI Comments: Patient is a 58 yo F PMHx significant for depression, migraines, ETOH dependence, cocaine dependence, asthma presenting to the ED for six days of epigastric burning pain with associated nausea, anxiety, and tremulousness. Patient states she stopped drinking alcohol just prior to the onset of symptoms. She states she has a long standing history of EtOH abuse with at least 12 beers ingested a day. Patient denies any history of withdrawal symptoms in the past, no history of seizures. No previous serious attempts to stop drinking. Denies any vomiting, diarrhea, fevers. Denies any cocaine use in the last 6 days.   Patient is a 58 y.o. female presenting with abdominal pain.  Abdominal Pain Associated symptoms: nausea   Associated symptoms: no diarrhea, no fever and no vomiting     Past Medical History  Diagnosis Date  . Depression   . Migraine headache   . Chronic back pain   . Alcohol dependence 06/11/2013  . Asthma    Past Surgical History  Procedure Laterality Date  . No past surgeries     No family history on file. History  Substance Use Topics  . Smoking status: Current Every Day Smoker -- 1.00 packs/day for 35 years    Types: Cigarettes  . Smokeless tobacco: Not on file  . Alcohol Use: 0.0 oz/week     Comment: fifth liquor daily - At [facility] now to stop drinking   OB History   Grav Para Term Preterm Abortions TAB SAB Ect Mult Living                 Review of Systems  Constitutional: Negative for fever.  Gastrointestinal: Positive for nausea and abdominal pain. Negative for vomiting and diarrhea.  Neurological: Positive for tremors. Negative for seizures.   Psychiatric/Behavioral: Negative for suicidal ideas. The patient is nervous/anxious.        Neg HI, hallucinations, self injury  All other systems reviewed and are negative.     Allergies  Review of patient's allergies indicates no known allergies.  Home Medications   Prior to Admission medications   Medication Sig Start Date End Date Taking? Authorizing Provider  albuterol (PROVENTIL HFA;VENTOLIN HFA) 108 (90 BASE) MCG/ACT inhaler Inhale 2 puffs into the lungs every 6 (six) hours as needed for wheezing. 06/26/13   Fransico Meadow, PA-C  azithromycin (ZITHROMAX) 250 MG tablet Take 1 tablet (250 mg total) by mouth daily. Take first 2 tablets together, then 1 every day until finished. 06/26/13   Fransico Meadow, PA-C  FLUoxetine (PROZAC) 40 MG capsule Take 1 capsule (40 mg total) by mouth daily. For depression 06/14/13   Encarnacion Slates, NP  gabapentin (NEURONTIN) 100 MG capsule Take 1 capsule (100 mg total) by mouth 3 (three) times daily. For anxiety/pain management 06/14/13   Encarnacion Slates, NP  Multiple Vitamin (MULTIVITAMIN WITH MINERALS) TABS tablet Take 1 tablet by mouth daily. For: Low Vitamin supplement 06/14/13   Encarnacion Slates, NP  risperiDONE (RISPERDAL) 1 MG tablet Take 1 tablet (1 mg total) by mouth at bedtime. For mood control 06/14/13   Encarnacion Slates, NP  traZODone (DESYREL) 50 MG tablet Take 1 tablet (50 mg total) by mouth at bedtime and may  repeat dose one time if needed. For sleep 06/14/13   Encarnacion Slates, NP   BP 182/97  Pulse 61  Temp(Src) 97.9 F (36.6 C) (Oral)  Resp 18  SpO2 100% Physical Exam  Nursing note and vitals reviewed. Constitutional: She is oriented to person, place, and time. She appears well-developed and well-nourished. No distress.  HENT:  Head: Normocephalic and atraumatic.  Right Ear: External ear normal.  Left Ear: External ear normal.  Nose: Nose normal.  Mouth/Throat: Oropharynx is clear and moist.  Eyes: Conjunctivae are normal.  Neck: Normal range of  motion. Neck supple.  Cardiovascular: Normal rate, regular rhythm and normal heart sounds.   Pulmonary/Chest: Effort normal and breath sounds normal. No respiratory distress.  Abdominal: Soft. Bowel sounds are normal. She exhibits no distension. There is tenderness. There is no rebound and no guarding.  Musculoskeletal: Normal range of motion.  Neurological: She is alert and oriented to person, place, and time. She displays tremor. GCS eye subscore is 4. GCS verbal subscore is 5. GCS motor subscore is 6.  Skin: Skin is warm and dry. She is not diaphoretic.  Psychiatric: Her speech is normal. Her mood appears anxious. She expresses no homicidal and no suicidal ideation. She expresses no suicidal plans and no homicidal plans.    ED Course  Procedures (including critical care time) Medications  LORazepam (ATIVAN) tablet 0-4 mg (0 mg Oral Not Given 02/17/14 1353)  LORazepam (ATIVAN) tablet 0-4 mg (0 mg Oral Not Given 02/17/14 1353)  LORazepam (ATIVAN) injection 0-4 mg (2 mg Intravenous Given 02/17/14 1329)  LORazepam (ATIVAN) injection 0-4 mg (0 mg Intravenous Not Given 02/17/14 1443)  thiamine (VITAMIN B-1) tablet 100 mg (100 mg Oral Not Given 02/17/14 1355)  thiamine (B-1) injection 100 mg (100 mg Intravenous Given 02/17/14 1331)  sucralfate (CARAFATE) 1 GM/10ML suspension 1 g (not administered)  white petrolatum (VASELINE) gel (not administered)  sodium chloride 0.9 % bolus 1,000 mL (not administered)  LORazepam (ATIVAN) injection 1 mg (not administered)  0.9 %  sodium chloride infusion (125 mL/hr Intravenous New Bag/Given 02/17/14 1443)  sodium chloride 0.9 % bolus 1,000 mL (0 mLs Intravenous Stopped 02/17/14 1442)    Labs Review Labs Reviewed  CBC WITH DIFFERENTIAL - Abnormal; Notable for the following:    RBC 5.44 (*)    Hemoglobin 16.4 (*)    HCT 47.3 (*)    All other components within normal limits  COMPREHENSIVE METABOLIC PANEL - Abnormal; Notable for the following:    Potassium 3.4  (*)    Glucose, Bld 123 (*)    GFR calc non Af Amer 74 (*)    GFR calc Af Amer 85 (*)    All other components within normal limits  URINALYSIS, ROUTINE W REFLEX MICROSCOPIC - Abnormal; Notable for the following:    Hgb urine dipstick LARGE (*)    Bilirubin Urine SMALL (*)    Ketones, ur 15 (*)    Protein, ur 100 (*)    All other components within normal limits  URINE RAPID DRUG SCREEN (HOSP PERFORMED) - Abnormal; Notable for the following:    Cocaine POSITIVE (*)    All other components within normal limits  URINE MICROSCOPIC-ADD ON - Abnormal; Notable for the following:    Bacteria, UA FEW (*)    All other components within normal limits  LIPASE, BLOOD  ETHANOL  I-STAT TROPOININ, ED    Imaging Review No results found.   EKG Interpretation   Date/Time:  Monday February 17 2014 11:56:31 EDT Ventricular Rate:  69 PR Interval:  126 QRS Duration: 76 QT Interval:  436 QTC Calculation: 467 R Axis:   67 Text Interpretation:  Normal sinus rhythm with sinus arrhythmia Biatrial  enlargement Nonspecific ST and T wave abnormality Prolonged QT Abnormal  ECG No significant change since last tracing Confirmed by YAO  MD, DAVID  ([id number]) on 02/17/2014 12:40:17 PM     CRITICAL CARE Performed by: Harlow Mares   Total critical care time: 30 minutes  Critical care time was exclusive of separately billable procedures and treating other patients.  Critical care was necessary to treat or prevent imminent or life-threatening deterioration.  Critical care was time spent personally by me on the following activities: development of treatment plan with patient and/or surrogate as well as nursing, discussions with consultants, evaluation of patient's response to treatment, examination of patient, obtaining history from patient or surrogate, ordering and performing treatments and interventions, ordering and review of laboratory studies, ordering and review of radiographic studies, pulse  oximetry and re-evaluation of patient's condition.  MDM   Final diagnoses:  Alcohol withdrawal    Filed Vitals:   02/17/14 1354  BP: 182/97  Pulse: 61  Temp:   Resp: 18   Afebrile, non-toxic appearing, AAOx4. Patient with epigastric discomfort, tremulousness, anxious and tearful x6 days after stopping alcohol use. Abdomen s/nt/nd. No peritoneal signs. Labs reviewed. ETOH < 11. Patient clinically appears to be withdrawing from ETOH and going through delirium tremors currently. CIWA protocol placed. Patient will be admitted to stepdown unit for monitoring and management of symptoms. Patient d/w with Dr. Darl Householder, agrees with plan.       Harlow Mares, PA-C 02/17/14 [place], PA-C 02/18/14 1652

## 2014-02-17 NOTE — ED Notes (Signed)
Pt reports epigastric pain for 6 days. States hasn't been eating in 6 days. Reports diarrhea. Also, nausea no vomiting. Pt is crying, tearful in triage. Is a x 4. Pt reports abdomen is normal size.

## 2014-02-17 NOTE — Progress Notes (Signed)
Received pt from ED to room 5w8, pt with tremors and c/o being hungry, denies nausea.  Instructed on call light usage, bed alarm on, pt instructed to call for assist if wanting to get OOB. Received ED report at 1519.

## 2014-02-17 NOTE — ED Notes (Signed)
Report given to RN on [facility].

## 2014-02-17 NOTE — ED Notes (Addendum)
Pt reports hasn't been drinking alcohol in 1 week. Was drinking 12 pack a day. Pt is shaky, tearful in triage.

## 2014-02-17 NOTE — ED Notes (Signed)
States that "stomach feels like has so much pressure on it, I am unable to eat anything, haven't eaten in 6 days." admits to drinking etoh and stopped 6 days ago, suddenly, denies having been through withdrawal in past, or having seizures from withdrawal in past.

## 2014-02-17 NOTE — H&P (Signed)
History and Physical       Hospital Admission Note Date: 02/17/2014  Patient name: [name] Medical record number: [id number] Date of birth: [birth year] Age: 58 y.o. Gender: female  PCP: [name], MD    Chief Complaint:  Epigastric abdominal cramping with nausea for last 6 days  HPI: Patient is a 58 year old female with history of depression, chronic back pain, asthma, alcohol abuse, nicotine abuse, cocaine abuse presented to the ER with epigastric abdominal pain, associated nausea, anxiety and tremulousness for last 6 days. History was obtained from the patient and her sister in the room. Patient reports that she drinks heavily, fifth of gin and at times 12 beers every day. A week ago, she started cutting down and quit drinking because she wanted to stop. 5-6 days before, she started having epigastric abdominal pain, cramping, intermittent, associated with nausea. Patient couldn't hold anything down. She is very anxious, shaky and tremulous.          Review of Systems:  Constitutional: Denies fever, chills, diaphoresis,++ poor appetite and fatigue.  HEENT: Denies photophobia, eye pain, redness, hearing loss, ear pain, congestion, sore throat, rhinorrhea, sneezing, mouth sores, trouble swallowing, neck pain, neck stiffness and tinnitus.   Respiratory: Denies SOB, DOE, cough, chest tightness,  and wheezing.   Cardiovascular: Denies chest pain, palpitations and leg swelling.  Gastrointestinal: Please see HPI Genitourinary: Denies dysuria, urgency, frequency, hematuria, flank pain and difficulty urinating.  Musculoskeletal: Denies myalgias, back pain, joint swelling, arthralgias and gait problem.  Skin: Denies pallor, rash and wound.  Neurological: Denies dizziness, seizures, syncope, weakness, light-headedness, numbness and headaches.  Hematological: Denies adenopathy. Easy bruising, personal or family bleeding history   Psychiatric/Behavioral: very anxious and tremulous  Past Medical History: Past Medical History  Diagnosis Date  . Depression   . Migraine headache   . Chronic back pain   . Alcohol dependence 06/11/2013  . Asthma    Past Surgical History  Procedure Laterality Date  . No past surgeries      Medications: Prior to Admission medications   Medication Sig Start Date End Date Taking? Authorizing Provider  albuterol (PROVENTIL HFA;VENTOLIN HFA) 108 (90 BASE) MCG/ACT inhaler Inhale 2 puffs into the lungs every 6 (six) hours as needed for wheezing. 06/26/13   Fransico Meadow, PA-C  azithromycin (ZITHROMAX) 250 MG tablet Take 1 tablet (250 mg total) by mouth daily. Take first 2 tablets together, then 1 every day until finished. 06/26/13   Fransico Meadow, PA-C  FLUoxetine (PROZAC) 40 MG capsule Take 1 capsule (40 mg total) by mouth daily. For depression 06/14/13   Encarnacion Slates, NP  gabapentin (NEURONTIN) 100 MG capsule Take 1 capsule (100 mg total) by mouth 3 (three) times daily. For anxiety/pain management 06/14/13   Encarnacion Slates, NP  Multiple Vitamin (MULTIVITAMIN WITH MINERALS) TABS tablet Take 1 tablet by mouth daily. For: Low Vitamin supplement 06/14/13   Encarnacion Slates, NP  risperiDONE (RISPERDAL) 1 MG tablet Take 1 tablet (1 mg total) by mouth at bedtime. For mood control 06/14/13   Encarnacion Slates, NP  traZODone (DESYREL) 50 MG tablet Take 1 tablet (50 mg total) by mouth at bedtime and may repeat dose one time if needed. For sleep 06/14/13   Encarnacion Slates, NP    Allergies:  No Known Allergies  Social History:  reports that she has been smoking Cigarettes.  She has a 35 pack-year smoking history. She does not have any smokeless tobacco history on file.  She reports that she drinks alcohol. She reports that she does not use illicit drugs.  Family History: No family history on file.  Physical Exam: Blood pressure 182/97, pulse 61, temperature 97.9 F (36.6 C), temperature source Oral, resp.  rate 18, SpO2 100.00%. General: Alert, awake, and oriented, in no acute distress, anxious. HEENT: normocephalic, atraumatic, anicteric sclera, pink conjunctiva, pupils equal and reactive to light and accomodation, oropharynx clear Neck: supple, no masses or lymphadenopathy, no goiter, no bruits  Heart: Regular rate and rhythm, without murmurs, rubs or gallops. Lungs: Clear to auscultation bilaterally, no wheezing, rales or rhonchi. Abdomen: Soft, epigastric tenderness, nondistended, positive bowel sounds, no masses. Extremities: No clubbing, cyanosis or edema with positive pedal pulses. Very tremulous  Neuro: Grossly intact, no focal neurological deficits, strength 5/5 upper and lower extremities bilaterally Psych: alert and oriented x 3, normal mood and affect Skin: no rashes or lesions, warm and dry   LABS on Admission:  Basic Metabolic Panel:  Recent Labs Lab 02/17/14 1224  NA 140  K 3.4*  CL 97  CO2 24  GLUCOSE 123*  BUN 8  CREATININE 0.86  CALCIUM 10.0   Liver Function Tests:  Recent Labs Lab 02/17/14 1224  AST 23  ALT 20  ALKPHOS 113  BILITOT 1.2  PROT 7.7  ALBUMIN 4.1    Recent Labs Lab 02/17/14 1224  LIPASE 42   No results found for this basename: AMMONIA,  in the last 168 hours CBC:  Recent Labs Lab 02/17/14 1224  WBC 6.6  NEUTROABS 3.7  HGB 16.4*  HCT 47.3*  MCV 86.9  PLT 332   Cardiac Enzymes: No results found for this basename: CKTOTAL, CKMB, CKMBINDEX, TROPONINI,  in the last 168 hours BNP: No components found with this basename: POCBNP,  CBG: No results found for this basename: GLUCAP,  in the last 168 hours   Radiological Exams on Admission: No results found.  Assessment/Plan Principal Problem: Abdominal pain; epigastric likely due to acute alcohol withdrawal, rule out any intra-abdominal pathology, with dehydration, unable to hold anything down - Obtain CT abdomen and pelvis to rule out any acute abdominal pathology, lipase  normal - Continue aggressive IV fluid hydration, antiemetics, pain control, clears for now   Active Problems: Acute alcohol withdrawal: - Patient denies any prior history of DUIs and seizures, she drinks heavily - Placed a Education officer, museum consult for substance abuse - Placed on CIWA protocol with scheduled Ativan, if not controlled with Ativan, patient may need Ativan drip or Precedex and higher level of care in step down unit - Placed on thiamine, folate, MVI    TOBACCO USER - Smokes one pack per day, placed on nicotine patch  - counseled on smoking cessation     Depression - For now continue her antidepressants, consult psych when patient is medically stable, interested in inpatient detox     Chronic back pain - Continue pain control     Cocaine dependence - Urine drug screen positive for cocaine, social worker consult placed for substance abuse   DVT prophylaxis:  Lovenox   CODE STATUS:  full code   Family Communication: Admission, patients condition and plan of care including tests being ordered have been discussed with the patient and her sister who indicates understanding and agree with the plan and Code Status   Further plan will depend as patient's clinical course evolves and further radiologic and laboratory data become available.   Time Spent on Admission: 1 hour   Dametra Whetsel K Sylvester Minton  M.D. [facility] 02/17/2014, 2:53 PM Pager: [phone]  If 7PM-7AM, please contact night-coverage [email or web address] Password [email or web address]  **Disclaimer: This note was dictated with voice recognition software. Similar sounding words can inadvertently be transcribed and this note may contain transcription errors which may not have been corrected upon publication of note.**

## 2014-02-18 DIAGNOSIS — F1420 Cocaine dependence, uncomplicated: Secondary | ICD-10-CM

## 2014-02-18 LAB — BASIC METABOLIC PANEL
BUN: 6 mg/dL (ref 6–23)
CHLORIDE: 106 meq/L (ref 96–112)
CO2: 26 mEq/L (ref 19–32)
CREATININE: 0.86 mg/dL (ref 0.50–1.10)
Calcium: 8.5 mg/dL (ref 8.4–10.5)
GFR, EST AFRICAN AMERICAN: 85 mL/min — AB (ref >90)
GFR, EST NON AFRICAN AMERICAN: 74 mL/min — AB (ref >90)
Glucose, Bld: 100 mg/dL — ABNORMAL HIGH (ref 70–99)
Potassium: 3.4 mEq/L — ABNORMAL LOW (ref 3.7–5.3)
Sodium: 144 mEq/L (ref 137–147)

## 2014-02-18 LAB — CBC
HEMATOCRIT: 40.4 % (ref 36.0–46.0)
Hemoglobin: 13.4 g/dL (ref 12.0–15.0)
MCH: 29.3 pg (ref 26.0–34.0)
MCHC: 33.2 g/dL (ref 30.0–36.0)
MCV: 88.4 fL (ref 78.0–100.0)
Platelets: 269 10*3/uL (ref 150–400)
RBC: 4.57 MIL/uL (ref 3.87–5.11)
RDW: 13 % (ref 11.5–15.5)
WBC: 5.2 10*3/uL (ref 4.0–10.5)

## 2014-02-18 MED ORDER — PROSIGHT PO TABS
1.0000 | ORAL_TABLET | Freq: Every day | ORAL | Status: DC
  Administered 2014-02-18 – 2014-02-21 (×4): 1 via ORAL
  Filled 2014-02-18 (×4): qty 1

## 2014-02-18 MED ORDER — TRAZODONE HCL 50 MG PO TABS
50.0000 mg | ORAL_TABLET | Freq: Every evening | ORAL | Status: DC | PRN
  Administered 2014-02-18 – 2014-02-20 (×3): 50 mg via ORAL
  Filled 2014-02-18 (×3): qty 1

## 2014-02-18 MED ORDER — ALBUTEROL SULFATE (2.5 MG/3ML) 0.083% IN NEBU: 2.5000 mg | INHALATION_SOLUTION | RESPIRATORY_TRACT | Status: DC | PRN

## 2014-02-18 MED ORDER — FOLIC ACID 1 MG PO TABS
1.0000 mg | ORAL_TABLET | Freq: Every day | ORAL | Status: DC
  Administered 2014-02-18 – 2014-02-21 (×4): 1 mg via ORAL
  Filled 2014-02-18 (×4): qty 1

## 2014-02-18 NOTE — Evaluation (Signed)
Physical Therapy Evaluation Patient Details Name: [name] MRN: [id number] DOB: 1956-03-25 Today's Date: 02/18/2014   History of Present Illness  Patient is a 58 year old female with history of depression, chronic back pain, asthma, alcohol abuse, nicotine abuse, cocaine abuse presented to the ER with epigastric abdominal pain, associated nausea, anxiety and tremulousness for last 6 days  Clinical Impression  Pt with decreased safety, strength, function, mobility and balance who will benefit from acute therapy to maximize mobility, balance and strength to decrease burden of care and maximize independence. Pt educated for RW use and need for assist with all transfers at present. Will continue to follow.     Follow Up Recommendations SNF;Supervision/Assistance - 24 hour    Equipment Recommendations  Rolling walker with 5" wheels    Recommendations for Other Services       Precautions / Restrictions Precautions Precautions: Fall      Mobility  Bed Mobility Overal bed mobility: Modified Independent             General bed mobility comments: increased time with use of rail  Transfers Overall transfer level: Needs assistance   Transfers: Sit to/from Stand Sit to Stand: Min guard;Min assist         General transfer comment: cues for hand placement and safety with guarding to stand and min assist to sit due to lack of controlled descent  Ambulation/Gait Ambulation/Gait assistance: Min assist Ambulation Distance (Feet): 50 Feet (50', 10') Assistive device: Rolling walker (2 wheeled) Gait Pattern/deviations: Step-through pattern;Decreased stride length;Trunk flexed;Shuffle Gait velocity: 10'/70sec= .83ft/sec Gait velocity interpretation: <1.8 ft/sec, indicative of risk for recurrent falls General Gait Details: slow, unsteady gait with difficulty controlling RW or avoiding obstacles with max cues and assist for directing RW  Stairs            Wheelchair  Mobility    Modified Rankin (Stroke Patients Only)       Balance Overall balance assessment: Needs assistance;History of Falls   Sitting balance-Leahy Scale: Fair       Standing balance-Leahy Scale: Poor                               Pertinent Vitals/Pain Pt reports sore abdomen but not rated    Home Living Family/patient expects to be discharged to:: Private residence Living Arrangements: Alone   Type of Home: Apartment Home Access: Level entry     Home Layout: One level Home Equipment: None      Prior Function Level of Independence: Independent               Hand Dominance        Extremity/Trunk Assessment   Upper Extremity Assessment: Generalized weakness           Lower Extremity Assessment: Generalized weakness;RLE deficits/detail;LLE deficits/detail RLE Deficits / Details: bil LE 3/5 hip flexion, knee flexion and extension LLE Deficits / Details: bil LE 3/5 hip flexion, knee flexion and extension  Cervical / Trunk Assessment: Normal  Communication   Communication: No difficulties  Cognition Arousal/Alertness: Awake/alert Behavior During Therapy: Flat affect Overall Cognitive Status: Impaired/Different from baseline Area of Impairment: Safety/judgement;Problem solving         Safety/Judgement: Decreased awareness of safety;Decreased awareness of deficits   Problem Solving: Slow processing;Difficulty sequencing;Requires verbal cues      General Comments      Exercises        Assessment/Plan    PT Assessment  Patient needs continued PT services  PT Diagnosis Abnormality of gait;Altered mental status   PT Problem List Decreased strength;Decreased cognition;Decreased activity tolerance;Decreased safety awareness;Decreased knowledge of use of DME;Decreased balance;Decreased mobility  PT Treatment Interventions Gait training;Functional mobility training;Therapeutic activities;Therapeutic exercise;Patient/family  education;Cognitive remediation;Neuromuscular re-education;Balance training;DME instruction   PT Goals (Current goals can be found in the Care Plan section) Acute Rehab PT Goals Patient Stated Goal: return home PT Goal Formulation: With patient Time For Goal Achievement: 03/04/14 Potential to Achieve Goals: Fair    Frequency Min 3X/week   Barriers to discharge Decreased caregiver support      Co-evaluation               End of Session Equipment Utilized During Treatment: Gait belt Activity Tolerance: Patient limited by fatigue Patient left: in chair;with call bell/phone within reach;with chair alarm set Nurse Communication: Mobility status         Time: 8938-1017 PT Time Calculation (min): 18 min   Charges:   PT Evaluation $Initial PT Evaluation Tier I: 1 Procedure PT Treatments $Gait Training: 8-22 mins   PT G Codes:          Cederick Broadnax B [place] Mailloux 02/18/2014, 12:25 PM Elwyn Reach, [place]

## 2014-02-18 NOTE — Progress Notes (Signed)
PATIENT DETAILS Name: [name] Age: 58 y.o. Sex: female Date of Birth: 1956-02-14 Admit Date: 02/17/2014 Admitting Physician Ripudeep Krystal Eaton, MD [id number],GLOVFI K, MD  Subjective: Feels somewhat better, decreased abdominal pain. Able to tolerate clear liquids so far.  Assessment/Plan: Principal Problem:   Abdominal pain -? Gastritis - CT abdomen negative for acute abnormality. Lipase negative for acute abnormality. - Pain somewhat better, no vomiting. - Continue with supportive measures with PPI, Carafate. - Continue to follow clinical course, if pain is persistent, may need a GI evaluation for endoscopy.  Active Problems: Vomiting - Likely secondary to gastritis. - Supportive care with antiemetics as needed  Alcohol use - Claims lasting more than 5 days back. - Minimally tremulous, however awake and alert. - Continue with Ativan per CIWA protocol, MVI, thiamine and folate. - Counseled extensively.  Chronic back pain - Claims to have chronic back pain, difficulty ambulating this morning, we'll check MRI of the sacral spine.  Depression - Stable. Continue with fluoxetine and Risperdal    TOBACCO USER - Counseled extensively, continue transdermal nicotine  Cocaine abuse - Urine toxicology positive for cocaine, counseled extensively.    COPD - Stable, clear lungs.  Disposition: Remain inpatient  DVT Prophylaxis: Prophylactic Lovenox   Code Status: Full code   Family Communication None at bedside  Procedures:  None  CONSULTS:  None  Time spent 40 minutes-which includes 50% of the time with face-to-face with patient/ family and coordinating care related to the above assessment and plan.    MEDICATIONS: Scheduled Meds: . enoxaparin (LOVENOX) injection  40 mg Subcutaneous Q24H  . FLUoxetine  40 mg Oral Daily  . gabapentin  100 mg Oral TID  . LORazepam  0-4 mg Intravenous 4 times per day  . LORazepam  0-4 mg Intravenous Q12H  .  LORazepam  0-4 mg Oral 4 times per day  . LORazepam  0-4 mg Oral Q12H  . nicotine  21 mg Transdermal Daily  . pantoprazole (PROTONIX) IV  40 mg Intravenous Q12H  . risperiDONE  1 mg Oral QHS  . sodium chloride  1,000 mL Intravenous Once  . sodium chloride  3 mL Intravenous Q12H  . sucralfate  1 g Oral TID WC & HS  . thiamine  100 mg Intravenous Daily  . thiamine  100 mg Oral Daily   Continuous Infusions: . sodium chloride 125 mL/hr at 02/17/14 1703   PRN Meds:.acetaminophen, acetaminophen, HYDROcodone-acetaminophen, HYDROmorphone (DILAUDID) injection, ipratropium-albuterol, ondansetron (ZOFRAN) IV, ondansetron, white petrolatum  Antibiotics: Anti-infectives   None       PHYSICAL EXAM: Vital signs in last 24 hours: Filed Vitals:   02/17/14 1537 02/17/14 1610 02/17/14 2229 02/18/14 0644  BP: 162/96 165/97 150/88 133/79  Pulse: 58 85 62 60  Temp:  98.4 F (36.9 C) 98 F (36.7 C) 97.6 F (36.4 C)  TempSrc:  Oral Oral Oral  Resp:  20 18 18   Height:  5\' 7"  (1.702 m)    Weight:  69.718 kg (153 lb 11.2 oz)    SpO2:  96% 97% 96%    Weight change:  Filed Weights   02/17/14 1610  Weight: 69.718 kg (153 lb 11.2 oz)   Body mass index is 24.07 kg/(m^2).   Gen Exam: Awake and alert with clear speech.   Neck: Supple, No JVD.   Chest: B/L Clear.   CVS: S1 S2 Regular, no murmurs.  Abdomen: soft, BS +, mild epigastric tenderness, non distended.  Extremities: no edema, lower extremities warm to touch. Neurologic: Non Focal- mild weakness in lower extremities-? Secondary to pain. Skin: No Rash.   Wounds: N/A.    Intake/Output from previous day:  Intake/Output Summary (Last 24 hours) at 02/18/14 1139 Last data filed at 02/18/14 0944  Gross per 24 hour  Intake   1600 ml  Output    550 ml  Net   1050 ml     LAB RESULTS: CBC  Recent Labs Lab 02/17/14 1224 02/18/14 0650  WBC 6.6 5.2  HGB 16.4* 13.4  HCT 47.3* 40.4  PLT 332 269  MCV 86.9 88.4  MCH 30.1 29.3  MCHC  34.7 33.2  RDW 12.7 13.0  LYMPHSABS 2.3  --   MONOABS 0.5  --   EOSABS 0.1  --   BASOSABS 0.0  --     Chemistries   Recent Labs Lab 02/17/14 1224 02/18/14 0650  NA 140 144  K 3.4* 3.4*  CL 97 106  CO2 24 26  GLUCOSE 123* 100*  BUN 8 6  CREATININE 0.86 0.86  CALCIUM 10.0 8.5    CBG: No results found for this basename: GLUCAP,  in the last 168 hours  GFR Estimated Creatinine Clearance: 70.2 ml/min (by C-G formula based on Cr of 0.86).  Coagulation profile No results found for this basename: INR, PROTIME,  in the last 168 hours  Cardiac Enzymes No results found for this basename: CK, CKMB, TROPONINI, MYOGLOBIN,  in the last 168 hours  No components found with this basename: POCBNP,  No results found for this basename: DDIMER,  in the last 72 hours No results found for this basename: HGBA1C,  in the last 72 hours No results found for this basename: CHOL, HDL, LDLCALC, TRIG, CHOLHDL, LDLDIRECT,  in the last 72 hours No results found for this basename: TSH, T4TOTAL, FREET3, T3FREE, THYROIDAB,  in the last 72 hours No results found for this basename: VITAMINB12, FOLATE, FERRITIN, TIBC, IRON, RETICCTPCT,  in the last 72 hours  Recent Labs  02/17/14 1224  LIPASE 42    Urine Studies No results found for this basename: UACOL, UAPR, USPG, UPH, UTP, UGL, UKET, UBIL, UHGB, UNIT, UROB, ULEU, UEPI, UWBC, URBC, UBAC, CAST, CRYS, UCOM, BILUA,  in the last 72 hours  MICROBIOLOGY: No results found for this or any previous visit (from the past 240 hour(s)).  RADIOLOGY STUDIES/RESULTS: Ct Abdomen Pelvis W Contrast  02/17/2014   CLINICAL DATA:  Epigastric pain  EXAM: CT ABDOMEN AND PELVIS WITH CONTRAST  TECHNIQUE: Multidetector CT imaging of the abdomen and pelvis was performed using the standard protocol following bolus administration of intravenous contrast.  CONTRAST:  158mL OMNIPAQUE IOHEXOL 300 MG/ML  SOLN  COMPARISON:  CT ABD/PELVIS W CM dated 06/27/2011  FINDINGS: The lung  bases are clear.  The liver demonstrates no focal abnormality. There is no intrahepatic or extrahepatic biliary ductal dilatation. The gallbladder is normal. The spleen demonstrates no focal abnormality. The kidneys, adrenal glands and pancreas are normal. The bladder is unremarkable.  The stomach, duodenum, small intestine, and large intestine demonstrate no contrast extravasation or dilatation. There is diverticulosis without evidence of diverticulitis. There is a normal caliber appendix in the right lower quadrant without periappendiceal inflammatory changes. There is no pneumoperitoneum, pneumatosis, or portal venous gas. There is no abdominal or pelvic free fluid. There is no lymphadenopathy.  The abdominal aorta is normal in caliber with atherosclerosis.  There are no lytic or sclerotic osseous lesions.  IMPRESSION: 1. No  acute abdominal or pelvic pathology.   Electronically Signed   By: Kathreen Devoid   On: 02/17/2014 20:48    Shanker Kristeen Mans, MD  [facility] Pager:[phone]  If 7PM-7AM, please contact night-coverage [email or web address] Password [facility] 02/18/2014, 11:39 AM   LOS: 1 day

## 2014-02-18 NOTE — Progress Notes (Signed)
INITIAL NUTRITION ASSESSMENT  DOCUMENTATION CODES Per approved criteria  -Not Applicable   INTERVENTION: Recommend monitoring of magnesium, potassium, and phosphorus daily during nutrition advancement, MD to replete as needed, as pt is at risk for refeeding syndrome given ongoing suboptimal oral intake. Recommend Ensure Complete po BID, each supplement provides 350 kcal and 13 grams of protein, as diet advances. Recommend MVI as diet advances. RD to continue to follow nutrition care plan.  NUTRITION DIAGNOSIS: Inadequate oral intake related to limited appetite as evidenced by patient report.   Goal: Diet advancement as tolerated. Intake to meet >90% of estimated nutrition needs.  Monitor:  weight trends, lab trends, I/O's, PO intake, supplement tolerance, further diet advancement  Reason for Assessment: Malnutrition Screening Tool  58 y.o. female  Admitting Dx: Abdominal pain  ASSESSMENT: PMHx significant for depression, polysubstance abuse, asthma. Admitted with epigastric abdominal pain, nausea, anxiety and tremors x 6 days. Work-up reveals ETOH withdrawal.  Patient reports that PTA, she hasn't eaten x 6 days. She also reports that she stopped drinking about 6 days ago. Pt drinks heavily - a fifth of gin and, at times, 12 beers daily. Urine drug screen positive for cocaine.  Currently ordered for Clear Liquid diet. Ate 3-4 jello's last night. This morning, was only able to eat some of her jello and chicken broth. Asking for Ensure, will order once diet permits.  Pt is unable to state her UBW or how much weight she has actually lost. Pt suspects that she has lost somewhere between 10-20 lb PTA, however family member at bedside reports that is likely an overestimate.  No severe fat or muscle wasting on exam.  Pt is at nutrition risk given poor PO intake and chronic polysubstance abuse. Suspect very poor diet quality PTA.  Potassium low at 3.4  Height: Ht Readings from Last  1 Encounters:  02/17/14 5\' 7"  (1.702 m)    Weight: Wt Readings from Last 1 Encounters:  02/17/14 153 lb 11.2 oz (69.718 kg)    Ideal Body Weight: 135 lb/61.4 kg  % Ideal Body Weight: 113%  Wt Readings from Last 10 Encounters:  02/17/14 153 lb 11.2 oz (69.718 kg)  06/26/13 170 lb (77.111 kg)  06/11/13 150 lb (68.04 kg)    Usual Body Weight: n/a  % Usual Body Weight: n/a  BMI:  Body mass index is 24.07 kg/(m^2). WNL  Estimated Nutritional Needs: Kcal: 1600 - 1800 Protein: 65 - 80 g Fluid: 1.6 - 1.8 liters daily  Skin: intact  Diet Order: Clear Liquid  EDUCATION NEEDS: -No education needs identified at this time   Intake/Output Summary (Last 24 hours) at 02/18/14 0906 Last data filed at 02/17/14 1800  Gross per 24 hour  Intake   1600 ml  Output      0 ml  Net   1600 ml    Last BM: 4/27  Labs:   Recent Labs Lab 02/17/14 1224 02/18/14 0650  NA 140 144  K 3.4* 3.4*  CL 97 106  CO2 24 26  BUN 8 6  CREATININE 0.86 0.86  CALCIUM 10.0 8.5  GLUCOSE 123* 100*    CBG (last 3)  No results found for this basename: GLUCAP,  in the last 72 hours  Scheduled Meds: . enoxaparin (LOVENOX) injection  40 mg Subcutaneous Q24H  . FLUoxetine  40 mg Oral Daily  . gabapentin  100 mg Oral TID  . LORazepam  0-4 mg Intravenous 4 times per day  . LORazepam  0-4 mg  Intravenous Q12H  . LORazepam  0-4 mg Oral 4 times per day  . LORazepam  0-4 mg Oral Q12H  . nicotine  21 mg Transdermal Daily  . pantoprazole (PROTONIX) IV  40 mg Intravenous Q12H  . risperiDONE  1 mg Oral QHS  . sodium chloride  1,000 mL Intravenous Once  . sodium chloride  3 mL Intravenous Q12H  . sucralfate  1 g Oral TID WC & HS  . thiamine  100 mg Intravenous Daily  . thiamine  100 mg Oral Daily    Continuous Infusions: . sodium chloride 125 mL/hr at 02/17/14 1703    Past Medical History  Diagnosis Date  . Depression   . Migraine headache   . Chronic back pain   . Alcohol dependence  06/11/2013  . Asthma     Past Surgical History  Procedure Laterality Date  . No past surgeries      Inda Coke MS, RD, LDN Inpatient Registered Dietitian Pager: [phone] After-hours pager: [phone]

## 2014-02-19 DIAGNOSIS — E860 Dehydration: Secondary | ICD-10-CM

## 2014-02-19 LAB — CBC
HCT: 39.3 % (ref 36.0–46.0)
Hemoglobin: 12.7 g/dL (ref 12.0–15.0)
MCH: 29.1 pg (ref 26.0–34.0)
MCHC: 32.3 g/dL (ref 30.0–36.0)
MCV: 89.9 fL (ref 78.0–100.0)
PLATELETS: 247 10*3/uL (ref 150–400)
RBC: 4.37 MIL/uL (ref 3.87–5.11)
RDW: 12.9 % (ref 11.5–15.5)
WBC: 5 10*3/uL (ref 4.0–10.5)

## 2014-02-19 LAB — URINE CULTURE: Colony Count: 2000

## 2014-02-19 LAB — GLUCOSE, CAPILLARY: GLUCOSE-CAPILLARY: 123 mg/dL — AB (ref 70–99)

## 2014-02-19 LAB — BASIC METABOLIC PANEL
BUN: 7 mg/dL (ref 6–23)
CALCIUM: 8.5 mg/dL (ref 8.4–10.5)
CO2: 26 mEq/L (ref 19–32)
Chloride: 107 mEq/L (ref 96–112)
Creatinine, Ser: 0.8 mg/dL (ref 0.50–1.10)
GFR calc Af Amer: >90 mL/min (ref >90)
GFR, EST NON AFRICAN AMERICAN: 80 mL/min — AB (ref >90)
Glucose, Bld: 108 mg/dL — ABNORMAL HIGH (ref 70–99)
Potassium: 3.7 mEq/L (ref 3.7–5.3)
Sodium: 144 mEq/L (ref 137–147)

## 2014-02-19 MED ORDER — ENSURE COMPLETE PO LIQD
237.0000 mL | Freq: Two times a day (BID) | ORAL | Status: DC
  Administered 2014-02-19 – 2014-02-21 (×5): 237 mL via ORAL

## 2014-02-19 MED ORDER — PANTOPRAZOLE SODIUM 40 MG PO TBEC
40.0000 mg | DELAYED_RELEASE_TABLET | Freq: Two times a day (BID) | ORAL | Status: DC
  Administered 2014-02-19 – 2014-02-21 (×5): 40 mg via ORAL
  Filled 2014-02-19 (×5): qty 1

## 2014-02-19 NOTE — Progress Notes (Addendum)
Clinical Social Work Department CLINICAL SOCIAL WORK PLACEMENT NOTE 02/19/2014  Patient:  [name], [name]  Account Number:  [id number] [place] date:  02/17/2014  Clinical Social Worker:  Megan Salon  Date/time:  02/19/2014 12:06 PM  Clinical Social Work is seeking post-discharge placement for this patient at the following level of care:   [place]   (*CSW will update this form in Epic as items are completed)   02/19/2014  Patient/family provided with [place] Department of Clinical Social Work's list of facilities offering this level of care within the geographic area requested by the patient (or if unable, by the patient's family).  02/19/2014  Patient/family informed of their freedom to choose among providers that offer the needed level of care, that participate in Medicare, Medicaid or managed care program needed by the patient, have an available bed and are willing to accept the patient.  02/19/2014  Patient/family informed of [facility]' ownership interest in [facility], as well as of the fact that they are under no obligation to receive care at this facility.  PASARR submitted to EDS on 02/19/2014 PASARR number received from EDS on 02/19/14  FL2 transmitted to all facilities in geographic area requested by pt/family on  02/19/2014 FL2 transmitted to all facilities within larger geographic area on   Patient informed that his/her managed care company has contracts with or will negotiate with  certain facilities, including the following:     Patient/family informed of bed offers received: 02/21/2014  Patient chooses bed at [facility] Physician recommends and patient chooses bed at    Patient to be transferred to  on  02/21/2014 Patient to be transferred to facility by [facility]  The following physician request were entered in Epic:   Additional Comments:  Jeanette Caprice, MSW, [place]

## 2014-02-19 NOTE — Progress Notes (Signed)
Clinical Social Work Department BRIEF PSYCHOSOCIAL ASSESSMENT 02/19/2014  Patient:  MONTANA, BRYNGELSON     Account Number:  [id number]     Admit date:  02/17/2014  Clinical Social Worker:  Megan Salon  Date/Time:  02/19/2014 12:01 PM  Referred by:  Physician  Date Referred:  02/19/2014 Referred for  SNF Placement   Other Referral:   Interview type:  Patient Other interview type:    PSYCHOSOCIAL DATA Living Status:  ALONE Admitted from facility:   Level of care:   Primary support name:  Lacie Demary Primary support relationship to patient:  CHILD, ADULT Degree of support available:   Okay    CURRENT CONCERNS Current Concerns  Post-Acute Placement   Other Concerns:    SOCIAL WORK ASSESSMENT / PLAN Clinical Social Worker received referral for SNF placement at d/c. CSW introduced self and explained reason for visit. CSW explained SNF process to patient. Patient reported she is agreeable for SNF placement as long as it is in [place].Patient states she may not want to go if its not in [place]. CSW explained the process and states social worker will search in [place] for a facility. CSW will complete FL2 for MD's signature and will update patient when bed offers are received.   Assessment/plan status:  Psychosocial Support/Ongoing Assessment of Needs Other assessment/ plan:   Information/referral to community resources:   SNF information    PATIENT'S/FAMILY'S RESPONSE TO PLAN OF CARE: Patient states she would be open to going to SNF.        Jeanette Caprice, MSW, [place]

## 2014-02-19 NOTE — Progress Notes (Signed)
[facility] PROGRESS NOTE  [name] BJY:[id number] DOB: 25-Apr-1956 DOA: 02/17/2014 PCP: [name], MD HPI 58 year old female with a history of EtOH abuse and cocaine abuse.  Average Etoh intake 1/5 gin +/- 15 beers a day.  5-6 days before admission she decided to stop drinking. She began to have epigastric pain, cramping, associated with nausea vomiting. She was shaky and tremulous on admission.  Subjective She reports improvement from admission.  Some trouble sleeping last night  Assessment/Plan:  Abdominal pain   Likely secondary to alcoholic gastritis.   CT abdomen negative for acute abnormality. Lipase negative for acute abnormality, LFT all within normal range.  Continue with PPI, Carafate.   Diet advanced to regular.  Vomiting   Likely secondary to gastritis.   Continue care with antiemetics prn  Alcohol use    Claims lasting more than 5-7 days back.   Reports recent cravings for EtOH   Minimally tremulous, however awake and alert.    Continue with Ativan per CIWA protocol, MVI, thiamine and folate.   Chronic back pain   Claims to have chronic back pain  No signs of back pain this morning  PT evaluation - recommended SNF to maximize mobility, balance and strength.   Depression   Stable. Continue with fluoxetine and Risperdal   TOBACCO USER   Continue transdermal nicotine   Counciled about the benefits of quitting  Cocaine abuse   Urine toxicology positive for cocaine  counseled extensively.   COPD   Stable,   clear lung sounds bilat.   Code Status: Full Family Communication: none Disposition Plan: discharge tomorrow to SNF   Consultants:  none  Procedures:  none  Antibiotics:  none  Objective: Filed Vitals:   02/19/14 0558  BP: 148/79  Pulse: 65  Temp: 97.4 F (36.3 C)  Resp: 18    Intake/Output Summary (Last 24 hours) at 02/19/14 1027 Last data filed at 02/19/14 0520  Gross per 24 hour  Intake       0 ml  Output   2500 ml  Net  -2500 ml   Filed Weights   02/17/14 1610  Weight: 69.718 kg (153 lb 11.2 oz)    Exam:  Physical Exam  Nursing note and vitals reviewed. Constitutional: She is oriented to person, place, and time. She appears well-developed and well-nourished. No distress.  HENT:   Head: Normocephalic and atraumatic.   Eyes: Pupils are equal, round, and reactive to light.  Neck: No JVD present.  Cardiovascular: Normal rate, regular rhythm and normal heart sounds.   Respiratory: Effort normal and breath sounds normal. No stridor. No respiratory distress. She has no wheezes. She exhibits no tenderness.  GI: Soft. Bowel sounds are normal. She exhibits no distension. There is no tenderness. There is no rebound.  Musculoskeletal: Normal range of motion. She exhibits no tenderness.  Neurological: She is alert and oriented to person, place, and time.  Skin: Skin is warm and dry. She is not diaphoretic. No erythema.  Psychiatric: She has a normal mood and affect. Her speech is normal and behavior is normal. Judgment and thought content normal. Cognition and memory are normal.    Data Reviewed: Basic Metabolic Panel:  Recent Labs Lab 02/17/14 1224 02/18/14 0650 02/19/14 0847  NA 140 144 144  K 3.4* 3.4* 3.7  CL 97 106 107  CO2 24 26 26   GLUCOSE 123* 100* 108*  BUN 8 6 7   CREATININE 0.86 0.86 0.80  CALCIUM 10.0 8.5 8.5  Liver Function Tests:  Recent Labs Lab 02/17/14 1224  AST 23  ALT 20  ALKPHOS 113  BILITOT 1.2  PROT 7.7  ALBUMIN 4.1    Recent Labs Lab 02/17/14 1224  LIPASE 42   CBC:  Recent Labs Lab 02/17/14 1224 02/18/14 0650 02/19/14 0527  WBC 6.6 5.2 5.0  NEUTROABS 3.7  --   --   HGB 16.4* 13.4 12.7  HCT 47.3* 40.4 39.3  MCV 86.9 88.4 89.9  PLT 332 269 247    Recent Results (from the past 240 hour(s))  URINE CULTURE     Status: None   Collection Time    02/17/14 10:18 PM      Result Value Ref Range Status   Specimen  Description URINE, CLEAN CATCH   Final   Special Requests NONE   Final   Culture  Setup Time     Final   Value: 02/17/2014 23:06     Performed at [facility] Count     Final   Value: 2,000 COLONIES/ML     Performed at [facility]   Culture     Final   Value: INSIGNIFICANT GROWTH     Performed at [facility]   Report Status 02/19/2014 FINAL   Final     Studies: Ct Abdomen Pelvis W Contrast  02/17/2014   CLINICAL DATA:  Epigastric pain  EXAM: CT ABDOMEN AND PELVIS WITH CONTRAST  TECHNIQUE: Multidetector CT imaging of the abdomen and pelvis was performed using the standard protocol following bolus administration of intravenous contrast.  CONTRAST:  166mL OMNIPAQUE IOHEXOL 300 MG/ML  SOLN  COMPARISON:  CT ABD/PELVIS W CM dated 06/27/2011  FINDINGS: The lung bases are clear.  The liver demonstrates no focal abnormality. There is no intrahepatic or extrahepatic biliary ductal dilatation. The gallbladder is normal. The spleen demonstrates no focal abnormality. The kidneys, adrenal glands and pancreas are normal. The bladder is unremarkable.  The stomach, duodenum, small intestine, and large intestine demonstrate no contrast extravasation or dilatation. There is diverticulosis without evidence of diverticulitis. There is a normal caliber appendix in the right lower quadrant without periappendiceal inflammatory changes. There is no pneumoperitoneum, pneumatosis, or portal venous gas. There is no abdominal or pelvic free fluid. There is no lymphadenopathy.  The abdominal aorta is normal in caliber with atherosclerosis.  There are no lytic or sclerotic osseous lesions.  IMPRESSION: 1. No acute abdominal or pelvic pathology.   Electronically Signed   By: Kathreen Devoid   On: 02/17/2014 20:48    Scheduled Meds: . enoxaparin (LOVENOX) injection  40 mg Subcutaneous Q24H  . feeding supplement (ENSURE COMPLETE)  237 mL Oral BID BM  . FLUoxetine  40 mg Oral Daily  . folic acid  1  mg Oral Daily  . gabapentin  100 mg Oral TID  . LORazepam  0-4 mg Intravenous 4 times per day  . LORazepam  0-4 mg Intravenous Q12H  . LORazepam  0-4 mg Oral 4 times per day  . LORazepam  0-4 mg Oral Q12H  . multivitamin  1 tablet Oral Daily  . nicotine  21 mg Transdermal Daily  . pantoprazole  40 mg Oral BID  . risperiDONE  1 mg Oral QHS  . sodium chloride  1,000 mL Intravenous Once  . sodium chloride  3 mL Intravenous Q12H  . sucralfate  1 g Oral TID WC & HS  . thiamine  100 mg Oral Daily   Continuous Infusions: .  sodium chloride 100 mL/hr at 02/19/14 0205    Principal Problem:   Abdominal pain Active Problems:   TOBACCO USER   COPD   Depression   Chronic back pain   Alcohol dependence   Cocaine dependence   Alcohol withdrawal   Dehydration    Time spent: [place], [place]  [facility]  If 7PM-7AM, please contact night-coverage at [email or web address], password [facility] 02/19/2014, 10:27 AM  LOS: 2 days    Addendum  Patient seen and examined, chart and data base reviewed.  I agree with the above assessment and plan.  For full details please see Mr. Denzil Hughes note.   Birdie Hopes, MD [facility] Pager: [phone] 02/19/2014, 1:19 PM

## 2014-02-20 ENCOUNTER — Inpatient Hospital Stay (HOSPITAL_COMMUNITY): Payer: Medicare Other

## 2014-02-20 ENCOUNTER — Encounter (HOSPITAL_COMMUNITY): Payer: Self-pay | Admitting: Radiology

## 2014-02-20 MED ORDER — LORAZEPAM 2 MG/ML IJ SOLN
INTRAMUSCULAR | Status: AC
  Filled 2014-02-20: qty 1

## 2014-02-20 MED ORDER — POTASSIUM CHLORIDE CRYS ER 20 MEQ PO TBCR
40.0000 meq | EXTENDED_RELEASE_TABLET | Freq: Once | ORAL | Status: AC
  Administered 2014-02-20: 40 meq via ORAL
  Filled 2014-02-20: qty 2

## 2014-02-20 MED ORDER — LORAZEPAM 2 MG/ML IJ SOLN
INTRAMUSCULAR | Status: AC
  Administered 2014-02-20: 1 mg via INTRAVENOUS
  Filled 2014-02-20: qty 1

## 2014-02-20 MED ORDER — GADOBENATE DIMEGLUMINE 529 MG/ML IV SOLN
15.0000 mL | Freq: Once | INTRAVENOUS | Status: AC | PRN
  Administered 2014-02-20: 14 mL via INTRAVENOUS

## 2014-02-20 MED ORDER — LORAZEPAM 2 MG/ML IJ SOLN
1.0000 mg | Freq: Once | INTRAMUSCULAR | Status: AC | PRN
  Administered 2014-02-20: 1 mg via INTRAVENOUS

## 2014-02-20 MED ORDER — LORAZEPAM 2 MG/ML IJ SOLN: 1.0000 mg | Freq: Once | INTRAMUSCULAR | Status: AC | PRN

## 2014-02-20 NOTE — Progress Notes (Signed)
[facility] PROGRESS NOTE  [name] NWG:[id number] DOB: December 01, 1955 DOA: 02/17/2014 PCP: [name], MD HPI 58 year old female with a history of EtOH abuse and cocaine abuse.  Average Etoh intake 1/5 gin +/- 15 beers a day.  5-6 days before admission she decided to stop drinking. She began to have epigastric pain, cramping, associated with nausea vomiting. She was shaky and tremulous on admission.  Subjective Less tremors, seen with daughter at bedside. Notified later by the RN that patient fell in the bathroom, her daughter was helping her. CT of the head negative for fracture/hemorrhage, right knee x-ray negative for fractures.  Assessment/Plan:  Abdominal pain   Likely secondary to alcoholic gastritis.   CT abdomen negative for acute abnormality. Lipase negative for acute abnormality, LFT all within normal range.  Continue with PPI, Carafate.   Diet advanced to regular.  Vomiting   Likely secondary to gastritis.   Continue care with antiemetics prn  Alcohol use    Claims lasting more than 5-7 days back.   Reports recent cravings for EtOH   Minimally tremulous, however awake and alert.    Continue with Ativan per CIWA protocol, MVI, thiamine and folate.   Chronic back pain   Claims to have chronic back pain  No signs of back pain this morning  PT evaluation - recommended SNF to maximize mobility, balance and strength.   Depression   Stable. Continue with fluoxetine and Risperdal   TOBACCO USER   Continue transdermal nicotine   Counciled about the benefits of quitting  Cocaine abuse   Urine toxicology positive for cocaine  counseled extensively.   COPD   Stable,   clear lung sounds bilat.  Back pain  MRI of the lumbar spine was done showed probable L5 nerve root involvement.  Patient evaluated by PT/OT and recommended skilled nursing facility.   Code Status: Full Family Communication: none Disposition Plan: Discharge  tomorrow to SNF   Consultants:  none  Procedures:  none  Antibiotics:  none  Objective: Filed Vitals:   02/20/14 1307  BP: 147/89  Pulse: 67  Temp: 98.1 F (36.7 C)  Resp: 16   No intake or output data in the 24 hours ending 02/20/14 1613 Filed Weights   02/17/14 1610  Weight: 69.718 kg (153 lb 11.2 oz)    Exam:  Physical Exam  Nursing note and vitals reviewed. Constitutional: She is oriented to person, place, and time. She appears well-developed and well-nourished. No distress.  HENT:   Head: Normocephalic and atraumatic.   Eyes: Pupils are equal, round, and reactive to light.  Neck: No JVD present.  Cardiovascular: Normal rate, regular rhythm and normal heart sounds.   Respiratory: Effort normal and breath sounds normal. No stridor. No respiratory distress. She has no wheezes. She exhibits no tenderness.  GI: Soft. Bowel sounds are normal. She exhibits no distension. There is no tenderness. There is no rebound.  Musculoskeletal: Normal range of motion. She exhibits no tenderness.  Neurological: She is alert and oriented to person, place, and time.  Skin: Skin is warm and dry. She is not diaphoretic. No erythema.  Psychiatric: She has a normal mood and affect. Her speech is normal and behavior is normal. Judgment and thought content normal. Cognition and memory are normal.    Data Reviewed: Basic Metabolic Panel:  Recent Labs Lab 02/17/14 1224 02/18/14 0650 02/19/14 0847  NA 140 144 144  K 3.4* 3.4* 3.7  CL 97 106 107  CO2 24 26  26  GLUCOSE 123* 100* 108*  BUN 8 6 7   CREATININE 0.86 0.86 0.80  CALCIUM 10.0 8.5 8.5   Liver Function Tests:  Recent Labs Lab 02/17/14 1224  AST 23  ALT 20  ALKPHOS 113  BILITOT 1.2  PROT 7.7  ALBUMIN 4.1    Recent Labs Lab 02/17/14 1224  LIPASE 42   CBC:  Recent Labs Lab 02/17/14 1224 02/18/14 0650 02/19/14 0527  WBC 6.6 5.2 5.0  NEUTROABS 3.7  --   --   HGB 16.4* 13.4 12.7  HCT 47.3* 40.4 39.3   MCV 86.9 88.4 89.9  PLT 332 269 247    Recent Results (from the past 240 hour(s))  URINE CULTURE     Status: None   Collection Time    02/17/14 10:18 PM      Result Value Ref Range Status   Specimen Description URINE, CLEAN CATCH   Final   Special Requests NONE   Final   Culture  Setup Time     Final   Value: 02/17/2014 23:06     Performed at [facility]     Final   Value: 2,000 COLONIES/ML     Performed at [facility]   Culture     Final   Value: INSIGNIFICANT GROWTH     Performed at [facility]   Report Status 02/19/2014 FINAL   Final     Studies: Dg Knee 1-2 Views Right  02/20/2014   CLINICAL DATA:  Fall.  Pain.  EXAM: RIGHT KNEE - 1-2 VIEW  COMPARISON:  None.  FINDINGS: No fracture or dislocation is noted on this two view exam.  Mild medial tibiofemoral joint space and patellofemoral joint degenerative changes.  IMPRESSION: No fracture or dislocation is noted on this two view exam.   Electronically Signed   By: Chauncey Cruel M.D.   On: 02/20/2014 14:50   Ct Head Wo Contrast  02/20/2014   CLINICAL DATA:  History of trauma from a fall with injury to the right side of the head.  EXAM: CT HEAD WITHOUT CONTRAST  TECHNIQUE: Contiguous axial images were obtained from the base of the skull through the vertex without intravenous contrast.  COMPARISON:  Head CT 10/22/2004.  FINDINGS: Patchy areas of mild decreased attenuation are noted throughout the deep and periventricular white matter of the cerebral hemispheres bilaterally, compatible with chronic microvascular ischemic disease. No acute displaced skull fractures are identified. No acute intracranial abnormality. Specifically, no evidence of acute post-traumatic intracranial hemorrhage, no definite regions of acute/subacute cerebral ischemia, no focal mass, mass effect, hydrocephalus or abnormal intra or extra-axial fluid collections. The visualized paranasal sinuses and mastoids are well  pneumatized.  IMPRESSION: 1. No acute displaced skull fractures or acute intracranial abnormalities. 2. Mild chronic microvascular ischemic changes in the cerebral white matter, as above.   Electronically Signed   By: Vinnie Langton M.D.   On: 02/20/2014 15:02   Mr Lumbar Spine W Wo Contrast  02/20/2014   CLINICAL DATA:  Chronic back pain.  EXAM: MRI LUMBAR SPINE WITHOUT AND WITH CONTRAST  TECHNIQUE: Multiplanar and multiecho pulse sequences of the lumbar spine were obtained without and with intravenous contrast.  CONTRAST:  45mL MULTIHANCE GADOBENATE DIMEGLUMINE 529 MG/ML IV SOLN  COMPARISON:  None.  FINDINGS: Normal alignment of the lumbar vertebral bodies. They demonstrate normal marrow signal. The last full intervertebral disc space is labeled L5-S1 and the conus medullaris terminates at the bottom of L1. The  facets are normally aligned. No pars defects.  No significant paraspinal or retroperitoneal process is identified.  L1-2:  No significant findings.  L2-3: Disc desiccation and degeneration. There is a bulging annulus and mild flattening of the ventral thecal sac but no significant spinal or foraminal stenosis. Mild bilateral lateral recess stenosis. Mild left foraminal encroachment.  L3-4:  No significant findings.  Mild to moderate facet disease.  L4-5: Mild to moderate facet disease but no disc protrusions, spinal or foraminal stenosis.  L5-S1: Degenerative disc disease with disc desiccation and degeneration. No significant spinal or lateral recess stenosis. There is a broad-based right foraminal and extra for disc protrusion, potentially irritating the right L5 nerve root.  IMPRESSION: 1. Degenerative disc disease at L2-3 with a bulging annulus and mild flattening of the ventral thecal sac. Mild bilateral lateral recess encroachment and mild left foraminal encroachment. 2. Shallow broad-based right foraminal and extra foraminal disc protrusion at L5-S1 potentially irritating the right L5 nerve root.    Electronically Signed   By: Kalman Jewels M.D.   On: 02/20/2014 09:19    Scheduled Meds: . enoxaparin (LOVENOX) injection  40 mg Subcutaneous Q24H  . feeding supplement (ENSURE COMPLETE)  237 mL Oral BID BM  . FLUoxetine  40 mg Oral Daily  . folic acid  1 mg Oral Daily  . gabapentin  100 mg Oral TID  . LORazepam      . LORazepam  0-4 mg Oral Q12H  . multivitamin  1 tablet Oral Daily  . nicotine  21 mg Transdermal Daily  . pantoprazole  40 mg Oral BID  . risperiDONE  1 mg Oral QHS  . sodium chloride  1,000 mL Intravenous Once  . sodium chloride  3 mL Intravenous Q12H  . sucralfate  1 g Oral TID WC & HS  . thiamine  100 mg Oral Daily   Continuous Infusions: . sodium chloride 100 mL/hr at 02/20/14 1235    Principal Problem:   Abdominal pain Active Problems:   TOBACCO USER   COPD   Depression   Chronic back pain   Alcohol dependence   Cocaine dependence   Alcohol withdrawal   Dehydration    Time spent: 7    [facility]  If 7PM-7AM, please contact night-coverage at [email or web address], password [facility] 02/20/2014, 4:13 PM  LOS: 3 days   Verlee Monte, MD [facility] Pager: [phone] 02/20/2014, 4:13 PM

## 2014-02-20 NOTE — Progress Notes (Signed)
Physical Therapy Treatment Patient Details Name: [name] MRN: [id number] DOB: 1956/05/14 Today's Date: 02/20/2014    History of Present Illness Patient is a 58 year old female with history of depression, chronic back pain, asthma, alcohol abuse, nicotine abuse, cocaine abuse presented to the ER with epigastric abdominal pain, associated nausea, anxiety and tremulousness for last 6 days    PT Comments    Pt just returned back from MRI but agreeable to participate in therapy session.  Pt presents with decreased safety, strength, independence with functional mobility.  Cont with current POC & encouraged pt to ambulate daily with [facility] staff.     Follow Up Recommendations  SNF;Supervision/Assistance - 24 hour     Equipment Recommendations  Rolling walker with 5" wheels    Recommendations for Other Services       Precautions / Restrictions Precautions Precautions: Fall Restrictions Weight Bearing Restrictions: No    Mobility  Bed Mobility Overal bed mobility: Modified Independent                Transfers Overall transfer level: Needs assistance Equipment used: Rolling walker (2 wheeled) Transfers: Sit to/from Stand Sit to Stand: Min guard         General transfer comment: cues for hand placement.  Performed 5x's throughout session.    Ambulation/Gait Ambulation/Gait assistance: Min guard Ambulation Distance (Feet): 60 Feet Assistive device: Rolling walker (2 wheeled) Gait Pattern/deviations: Step-through pattern;Decreased stride length;Shuffle     General Gait Details: Increased time.  Pt with small shuffling steps.  Fatigues quickly.     Stairs            Wheelchair Mobility    Modified Rankin (Stroke Patients Only)       Balance                                    Cognition Arousal/Alertness: Awake/alert Behavior During Therapy: Flat affect Overall Cognitive Status: Impaired/Different from baseline Area of  Impairment: Safety/judgement;Problem solving         Safety/Judgement: Decreased awareness of deficits;Decreased awareness of safety   Problem Solving: Slow processing;Requires verbal cues;Requires tactile cues      Exercises General Exercises - Lower Extremity Ankle Circles/Pumps: AROM;Both;10 reps Long Arc Quad: AROM;Strengthening;Both;10 reps Hip ABduction/ADduction: AROM;Strengthening;Both;10 reps Straight Leg Raises: AROM;Strengthening;Both;10 reps Hip Flexion/Marching: AROM;Strengthening;Both;10 reps    General Comments        Pertinent Vitals/Pain No pain reported.     Home Living                      Prior Function            PT Goals (current goals can now be found in the care plan section) Acute Rehab PT Goals Patient Stated Goal: return home PT Goal Formulation: With patient Time For Goal Achievement: 03/04/14 Potential to Achieve Goals: Fair Progress towards PT goals: Progressing toward goals    Frequency  Min 3X/week    PT Plan Current plan remains appropriate    Co-evaluation             End of Session Equipment Utilized During Treatment: Gait belt Activity Tolerance: Patient tolerated treatment well;Patient limited by fatigue Patient left: in bed;with call bell/phone within reach;with family/visitor present     Time: 1093-2355 PT Time Calculation (min): 23 min  Charges:  $Gait Training: 8-22 mins $Therapeutic Activity: 8-22 mins  Sena Hitch 02/20/2014, 2:51 PM   Sarajane Marek, PTA [phone] 02/20/2014

## 2014-02-20 NOTE — Progress Notes (Signed)
NUTRITION FOLLOW-UP  DOCUMENTATION CODES Per approved criteria  -Not Applicable   INTERVENTION: Continue Ensure Complete po BID and MVI daily. RD to continue to follow nutrition care plan.  NUTRITION DIAGNOSIS: Inadequate oral intake related to limited appetite as evidenced by patient report. Improving.   Goal: Diet advancement as tolerated. Met. Intake to meet >90% of estimated nutrition needs. Improving.  Monitor:  weight trends, lab trends, I/O's, PO intake, supplement tolerance  ASSESSMENT: PMHx significant for depression, polysubstance abuse, asthma. Admitted with epigastric abdominal pain, nausea, anxiety and tremors x 6 days. Work-up reveals ETOH withdrawal.  Patient reports that PTA, she hasn't eaten x 6 days. She also reports that she stopped drinking about 6 days ago. Pt drinks heavily - a fifth of gin and, at times, 12 beers daily. Urine drug screen positive for cocaine.  Diet advanced to Regular on 4/28. MD states that abdominal pain is likely 2/2 alcoholic gastritis. Currently off unit for MRI. Ensure Complete ordered yesterday. Per doc flowsheets, pt now completing 100% of meals.  Pt is at nutrition risk given poor PO intake and chronic polysubstance abuse. Suspect very poor diet quality PTA.  Potassium repleted  Height: Ht Readings from Last 1 Encounters:  02/17/14 _0  (1.702 m)    Weight: Wt Readings from Last 1 Encounters:  02/17/14 153 lb 11.2 oz (69.718 kg)  No new weight available  BMI:  Body mass index is 24.07 kg/(m^2). WNL  Estimated Nutritional Needs: Kcal: 1600 - 1800 Protein: 65 - 80 g Fluid: 1.6 - 1.8 liters daily  Skin: intact  Diet Order: General  No intake or output data in the 24 hours ending 02/20/14 1018  Last BM: 4/27  Labs:   Recent Labs Lab 02/17/14 1224 02/18/14 0650 02/19/14 0847  NA 140 144 144  K 3.4* 3.4* 3.7  CL 97 106 107  CO2 _1 BUN _2 CREATININE 0.86 0.86 0.80  CALCIUM 10.0 8.5 8.5   GLUCOSE 123* 100* 108*    CBG (last 3)   Recent Labs  02/19/14 2147  GLUCAP 123*    Scheduled Meds: . enoxaparin (LOVENOX) injection  40 mg Subcutaneous Q24H  . feeding supplement (ENSURE COMPLETE)  237 mL Oral BID BM  . FLUoxetine  40 mg Oral Daily  . folic acid  1 mg Oral Daily  . gabapentin  100 mg Oral TID  . LORazepam      . LORazepam  0-4 mg Oral Q12H  . multivitamin  1 tablet Oral Daily  . nicotine  21 mg Transdermal Daily  . pantoprazole  40 mg Oral BID  . risperiDONE  1 mg Oral QHS  . sodium chloride  1,000 mL Intravenous Once  . sodium chloride  3 mL Intravenous Q12H  . sucralfate  1 g Oral TID WC & HS  . thiamine  100 mg Oral Daily    Continuous Infusions: . sodium chloride 100 mL/hr at 02/19/14 2236   Inda Coke MS, RD, LDN Inpatient Registered Dietitian Pager: [phone] After-hours pager: [phone]

## 2014-02-20 NOTE — Care Management Note (Signed)
    Page 1 of 1   02/21/2014     4:25:54 PM CARE MANAGEMENT NOTE 02/21/2014  Patient:  [name], [name]   Account Number:  [id number]  Date Initiated:  02/20/2014  Documentation initiated by:  Tomi Bamberger  Subjective/Objective Assessment:   dx  abd pain, uti, vomiting  admit- from home.     Action/Plan:   pt eval- rec  snf.   Anticipated DC Date:  02/21/2014   Anticipated DC Plan:  SKILLED NURSING FACILITY  In-house referral  Clinical Social Worker      DC Planning Services  CM consult      Choice offered to / List presented to:             Status of service:  Completed, signed off Medicare Important Message given?  NO (If response is "NO", the following Medicare IM given date fields will be blank) Date Medicare IM given:   Date Additional Medicare IM given:  02/21/2014  Discharge Disposition:  [facility]  Per UR Regulation:  Reviewed for med. necessity/level of care/duration of stay  If discussed at [place] of Stay Meetings, dates discussed:    Comments:

## 2014-02-20 NOTE — Progress Notes (Signed)
PT Cancellation Note  Patient Details Name: [name] MRN: [id number] DOB: 1956-04-10   Cancelled Treatment:     PT session cancelled at this time due to pt off floor for MRI.  Will attempt back later today if time allows.     Sena Hitch 02/20/2014, 8:21 AM   Sarajane Marek, PTA [phone] 02/20/2014

## 2014-02-20 NOTE — ED Provider Notes (Signed)
Medical screening examination/treatment/procedure(s) were performed by non-physician practitioner and as supervising physician I was immediately available for consultation/collaboration.   EKG Interpretation   Date/Time:  Monday February 17 2014 11:56:31 EDT Ventricular Rate:  69 PR Interval:  126 QRS Duration: 76 QT Interval:  436 QTC Calculation: 467 R Axis:   67 Text Interpretation:  Normal sinus rhythm with sinus arrhythmia Biatrial  enlargement Nonspecific ST and T wave abnormality Prolonged QT Abnormal  ECG No significant change since last tracing Confirmed by YAO  MD, DAVID  ([id number]) on 02/17/2014 12:40:17 PM        Wandra Arthurs, MD 02/20/14 [phone]

## 2014-02-20 NOTE — Progress Notes (Signed)
Dr. Hartford Poli informed of patient's fall.  Awaiting orders.  Will continue to monitor pt.

## 2014-02-20 NOTE — Progress Notes (Signed)
CSW provided bed offer to patient, which was [facility]. Patient called her daughter to see what her daughter's thoughts were and then states that she will go with [facility] at discharge. CSW made [facility] aware of patient wanting their facility and confirmed a bed for dc.  Jeanette Caprice, MSW, [place]

## 2014-02-20 NOTE — Progress Notes (Addendum)
Patient's alarm alerted from bathroom.  Bed alarm did not alarm.  This nurse called nurse tech to enter room to check on pt.  When called into room, nurse tech answered for assistance and that the pt had fallen.  Upon entering the room, the pt was being walked back to bed by daughter and nurse tech. without the use of the walker.  Pt stated "I fell between the toilet and the wall and hit my head and my knee.  Daughter stated "I walked her to the bathroom and watched her get into the bathroom and closed the door when I saw her get in."  When asked about seeing the pt sit on the commode, the daughter stated "I did not see her sit on the toilet.  I was told that it was safe to take her to the bathroom."  When asked who told her to do this, the patient and family stated "It was someone in purple."  Assessed patient's body and found light abrasions to right elbow, right hand, and right knee.  No evidence of bleeding or swelling.  Pt stated pain in head was 8/10.  Pt is alert and oriented x4.  Informed the charge nurse.  Will continue to monitor.

## 2014-02-21 LAB — BASIC METABOLIC PANEL
BUN: 14 mg/dL (ref 6–23)
CO2: 28 mEq/L (ref 19–32)
Calcium: 8.7 mg/dL (ref 8.4–10.5)
Chloride: 107 mEq/L (ref 96–112)
Creatinine, Ser: 0.93 mg/dL (ref 0.50–1.10)
GFR, EST AFRICAN AMERICAN: 78 mL/min — AB (ref >90)
GFR, EST NON AFRICAN AMERICAN: 67 mL/min — AB (ref >90)
Glucose, Bld: 104 mg/dL — ABNORMAL HIGH (ref 70–99)
Potassium: 4.1 mEq/L (ref 3.7–5.3)
Sodium: 145 mEq/L (ref 137–147)

## 2014-02-21 NOTE — Discharge Summary (Signed)
Physician Discharge Summary  [name] [id number] DOB: September 25, 1956 DOA: 02/17/2014  PCP: [name], MD  Admit date: 02/17/2014 Discharge date: 02/21/2014  Time spent: 40 minutes  Recommendations for Outpatient Follow-up:  1. Followup with the nursing home M.D.  Discharge Diagnoses:  Principal Problem:   Abdominal pain Active Problems:   TOBACCO USER   COPD   Depression   Chronic back pain   Alcohol dependence   Cocaine dependence   Alcohol withdrawal   Dehydration   Discharge Condition: Stable  Diet recommendation: Regular diet  Filed Weights   02/17/14 1610  Weight: 69.718 kg (153 lb 11.2 oz)    History of present illness:  Patient is a 58 year old female with history of depression, chronic back pain, asthma, alcohol abuse, nicotine abuse, cocaine abuse presented to the ER with epigastric abdominal pain, associated nausea, anxiety and tremulousness for last 6 days. History was obtained from the patient and her sister in the room. Patient reports that she drinks heavily, fifth of gin and at times 12 beers every day. A week ago, she started cutting down and quit drinking because she wanted to stop. 5-6 days before, she started having epigastric abdominal pain, cramping, intermittent, associated with nausea. Patient couldn't hold anything down. She is very anxious, shaky and tremulous.  Hospital Course:   Abdominal pain  Likely secondary to alcoholic gastritis.  CT abdomen negative for acute abnormality. Lipase negative for acute abnormality, LFT all within normal range.  Continue with PPI, Carafate.  Diet advanced to regular.  Vomiting  Likely secondary to gastritis.  Continue care with antiemetics prn  Alcohol use/withdrawal  Claims lasting more than 5-7 days back.  Reports recent cravings for EtOH, patient went through mild withdrawal symptoms. Was minimally tremulous, no tremors improved. Was on Ativan CIWA protocol, multivitamin, folate and thiamine  and these were discontinued on discharge.  Chronic back pain  Reported chronic back pain  MRI of the L-spine showed L2-3 and L5-S1 disc flattenin with probable L5 nerve root involvement.  Patient evaluated by PT/OT and recommended skilled nursing facility.  Depression  Stable. Continue with fluoxetine and Risperdal   TOBACCO USER  Continue transdermal nicotine  Counciled about the benefits of quitting  Cocaine abuse  Urine toxicology positive for cocaine  counseled extensively.   COPD  Stable,  clear lung sounds laterally.  Fall  Patient fell in the hospital when she was going to the bathroom called by her daughter.  CT scan of the head and x-rays of the right knee were obtained showed no abnormalities.  Instructed by the nurses that she needs to call someone when she get off of the bed and placed on fall precautions.  Procedures:  None  Consultations:  None  Discharge Exam: Filed Vitals:   02/21/14 0454  BP: 151/82  Pulse: 54  Temp: 97.8 F (36.6 C)  Resp: 16   General: Alert and awake, oriented x3, not in any acute distress. HEENT: anicteric sclera, pupils reactive to light and accommodation, EOMI CVS: S1-S2 clear, no murmur rubs or gallops Chest: clear to auscultation bilaterally, no wheezing, rales or rhonchi Abdomen: soft nontender, nondistended, normal bowel sounds, no organomegaly Extremities: no cyanosis, clubbing or edema noted bilaterally Neuro: Cranial nerves II-XII intact, no focal neurological deficits  Discharge Instructions You were cared for by a hospitalist during your hospital stay. If you have any questions about your discharge medications or the care you received while you were in the hospital after you are discharged, you can  call the unit and asked to speak with the hospitalist on call if the hospitalist that took care of you is not available. Once you are discharged, your primary care physician will handle any further medical issues.  Please note that NO REFILLS for any discharge medications will be authorized once you are discharged, as it is imperative that you return to your primary care physician (or establish a relationship with a primary care physician if you do not have one) for your aftercare needs so that they can reassess your need for medications and monitor your lab values.     Medication List    ASK your doctor about these medications       FLUoxetine 40 MG capsule  Commonly known as:  PROZAC  Take 1 capsule (40 mg total) by mouth daily. For depression     risperiDONE 1 MG tablet  Commonly known as:  RISPERDAL  Take 1 tablet (1 mg total) by mouth at bedtime. For mood control     traZODone 50 MG tablet  Commonly known as:  DESYREL  Take 1 tablet (50 mg total) by mouth at bedtime and may repeat dose one time if needed. For sleep       No Known Allergies    The results of significant diagnostics from this hospitalization (including imaging, microbiology, ancillary and laboratory) are listed below for reference.    Significant Diagnostic Studies: Dg Knee 1-2 Views Right  02/20/2014   CLINICAL DATA:  Fall.  Pain.  EXAM: RIGHT KNEE - 1-2 VIEW  COMPARISON:  None.  FINDINGS: No fracture or dislocation is noted on this two view exam.  Mild medial tibiofemoral joint space and patellofemoral joint degenerative changes.  IMPRESSION: No fracture or dislocation is noted on this two view exam.   Electronically Signed   By: Chauncey Cruel M.D.   On: 02/20/2014 14:50   Ct Head Wo Contrast  02/20/2014   CLINICAL DATA:  History of trauma from a fall with injury to the right side of the head.  EXAM: CT HEAD WITHOUT CONTRAST  TECHNIQUE: Contiguous axial images were obtained from the base of the skull through the vertex without intravenous contrast.  COMPARISON:  Head CT 10/22/2004.  FINDINGS: Patchy areas of mild decreased attenuation are noted throughout the deep and periventricular white matter of the cerebral hemispheres  bilaterally, compatible with chronic microvascular ischemic disease. No acute displaced skull fractures are identified. No acute intracranial abnormality. Specifically, no evidence of acute post-traumatic intracranial hemorrhage, no definite regions of acute/subacute cerebral ischemia, no focal mass, mass effect, hydrocephalus or abnormal intra or extra-axial fluid collections. The visualized paranasal sinuses and mastoids are well pneumatized.  IMPRESSION: 1. No acute displaced skull fractures or acute intracranial abnormalities. 2. Mild chronic microvascular ischemic changes in the cerebral white matter, as above.   Electronically Signed   By: Vinnie Langton M.D.   On: 02/20/2014 15:02   Mr Lumbar Spine W Wo Contrast  02/20/2014   CLINICAL DATA:  Chronic back pain.  EXAM: MRI LUMBAR SPINE WITHOUT AND WITH CONTRAST  TECHNIQUE: Multiplanar and multiecho pulse sequences of the lumbar spine were obtained without and with intravenous contrast.  CONTRAST:  20mL MULTIHANCE GADOBENATE DIMEGLUMINE 529 MG/ML IV SOLN  COMPARISON:  None.  FINDINGS: Normal alignment of the lumbar vertebral bodies. They demonstrate normal marrow signal. The last full intervertebral disc space is labeled L5-S1 and the conus medullaris terminates at the bottom of L1. The facets are normally aligned. No pars defects.  No significant paraspinal or retroperitoneal process is identified.  L1-2:  No significant findings.  L2-3: Disc desiccation and degeneration. There is a bulging annulus and mild flattening of the ventral thecal sac but no significant spinal or foraminal stenosis. Mild bilateral lateral recess stenosis. Mild left foraminal encroachment.  L3-4:  No significant findings.  Mild to moderate facet disease.  L4-5: Mild to moderate facet disease but no disc protrusions, spinal or foraminal stenosis.  L5-S1: Degenerative disc disease with disc desiccation and degeneration. No significant spinal or lateral recess stenosis. There is a  broad-based right foraminal and extra for disc protrusion, potentially irritating the right L5 nerve root.  IMPRESSION: 1. Degenerative disc disease at L2-3 with a bulging annulus and mild flattening of the ventral thecal sac. Mild bilateral lateral recess encroachment and mild left foraminal encroachment. 2. Shallow broad-based right foraminal and extra foraminal disc protrusion at L5-S1 potentially irritating the right L5 nerve root.   Electronically Signed   By: Kalman Jewels M.D.   On: 02/20/2014 09:19   Ct Abdomen Pelvis W Contrast  02/17/2014   CLINICAL DATA:  Epigastric pain  EXAM: CT ABDOMEN AND PELVIS WITH CONTRAST  TECHNIQUE: Multidetector CT imaging of the abdomen and pelvis was performed using the standard protocol following bolus administration of intravenous contrast.  CONTRAST:  138mL OMNIPAQUE IOHEXOL 300 MG/ML  SOLN  COMPARISON:  CT ABD/PELVIS W CM dated 06/27/2011  FINDINGS: The lung bases are clear.  The liver demonstrates no focal abnormality. There is no intrahepatic or extrahepatic biliary ductal dilatation. The gallbladder is normal. The spleen demonstrates no focal abnormality. The kidneys, adrenal glands and pancreas are normal. The bladder is unremarkable.  The stomach, duodenum, small intestine, and large intestine demonstrate no contrast extravasation or dilatation. There is diverticulosis without evidence of diverticulitis. There is a normal caliber appendix in the right lower quadrant without periappendiceal inflammatory changes. There is no pneumoperitoneum, pneumatosis, or portal venous gas. There is no abdominal or pelvic free fluid. There is no lymphadenopathy.  The abdominal aorta is normal in caliber with atherosclerosis.  There are no lytic or sclerotic osseous lesions.  IMPRESSION: 1. No acute abdominal or pelvic pathology.   Electronically Signed   By: Kathreen Devoid   On: 02/17/2014 20:48    Microbiology: Recent Results (from the past 240 hour(s))  URINE CULTURE      Status: None   Collection Time    02/17/14 10:18 PM      Result Value Ref Range Status   Specimen Description URINE, CLEAN CATCH   Final   Special Requests NONE   Final   Culture  Setup Time     Final   Value: 02/17/2014 23:06     Performed at [place]     Final   Value: 2,000 COLONIES/ML     Performed at [facility]   Culture     Final   Value: INSIGNIFICANT GROWTH     Performed at [facility]   Report Status 02/19/2014 FINAL   Final     Labs: Basic Metabolic Panel:  Recent Labs Lab 02/17/14 1224 02/18/14 0650 02/19/14 0847 02/21/14 0728  NA 140 144 144 145  K 3.4* 3.4* 3.7 4.1  CL 97 106 107 107  CO2 24 26 26 28   GLUCOSE 123* 100* 108* 104*  BUN 8 6 7 14   CREATININE 0.86 0.86 0.80 0.93  CALCIUM 10.0 8.5 8.5 8.7   Liver Function Tests:  Recent Labs Lab 02/17/14 1224  AST 23  ALT 20  ALKPHOS 113  BILITOT 1.2  PROT 7.7  ALBUMIN 4.1    Recent Labs Lab 02/17/14 1224  LIPASE 42   No results found for this basename: AMMONIA,  in the last 168 hours CBC:  Recent Labs Lab 02/17/14 1224 02/18/14 0650 02/19/14 0527  WBC 6.6 5.2 5.0  NEUTROABS 3.7  --   --   HGB 16.4* 13.4 12.7  HCT 47.3* 40.4 39.3  MCV 86.9 88.4 89.9  PLT 332 269 247   Cardiac Enzymes: No results found for this basename: CKTOTAL, CKMB, CKMBINDEX, TROPONINI,  in the last 168 hours BNP: BNP (last 3 results) No results found for this basename: PROBNP,  in the last 8760 hours CBG:  Recent Labs Lab 02/19/14 2147  GLUCAP 123*       Signed:  Gurshan Settlemire  [facility] 02/21/2014, 11:21 AM

## 2014-02-21 NOTE — Progress Notes (Signed)
Physical Therapy Treatment Patient Details Name: [name] MRN: [id number] DOB: September 06, 1956 Today's Date: 02/21/2014    History of Present Illness Patient is a 58 year old female with history of depression, chronic back pain, asthma, alcohol abuse, nicotine abuse, cocaine abuse presented to the ER with epigastric abdominal pain, associated nausea, anxiety and tremulousness for last 6 days    PT Comments    Pt progressing with mobility at this date but cont's to present with weakness & decreased independence with functional mobility.  Cont with current POC to maximize pt's functional recovery prior to d/c to next venue.     Follow Up Recommendations  SNF;Supervision/Assistance - 24 hour     Equipment Recommendations  Rolling walker with 5" wheels    Recommendations for Other Services       Precautions / Restrictions Precautions Precautions: Fall Restrictions Weight Bearing Restrictions: No    Mobility  Bed Mobility Overal bed mobility: Modified Independent                Transfers Overall transfer level: Needs assistance Equipment used: Rolling walker (2 wheeled) Transfers: Sit to/from Stand Sit to Stand: Min guard         General transfer comment: cues to reinforce hand placement  Ambulation/Gait Ambulation/Gait assistance: Min guard Ambulation Distance (Feet): 100 Feet Assistive device: Rolling walker (2 wheeled) Gait Pattern/deviations: Step-to pattern;Step-through pattern;Decreased stride length Gait velocity: decreased   General Gait Details: Pt's LE's did not appear as shaky today as previous PT session but she does cont to rely heavily on RW with UE's & take short steps with small pauses between steps.  Encouragement for more fluid gait pattern.     Stairs            Wheelchair Mobility    Modified Rankin (Stroke Patients Only)       Balance                                    Cognition Arousal/Alertness:  Awake/alert Behavior During Therapy: WFL for tasks assessed/performed Overall Cognitive Status: Impaired/Different from baseline Area of Impairment: Safety/judgement         Safety/Judgement: Decreased awareness of safety;Decreased awareness of deficits          Exercises      General Comments        Pertinent Vitals/Pain No pain reported.      Home Living                      Prior Function            PT Goals (current goals can now be found in the care plan section) Acute Rehab PT Goals Patient Stated Goal: return home PT Goal Formulation: With patient Time For Goal Achievement: 03/04/14 Potential to Achieve Goals: Fair Progress towards PT goals: Progressing toward goals    Frequency  Min 3X/week    PT Plan Current plan remains appropriate    Co-evaluation             End of Session Equipment Utilized During Treatment: Gait belt Activity Tolerance: Patient tolerated treatment well Patient left: in chair;with call bell/phone within reach;with family/visitor present     Time: 1022-1045 PT Time Calculation (min): 23 min  Charges:  $Gait Training: 23-37 mins  Sena Hitch 02/21/2014, 2:11 PM  Sarajane Marek, [place] [phone] 02/21/2014

## 2014-02-21 NOTE — Progress Notes (Signed)
Clinical social worker assisted with patient discharge to skilled nursing facility, [facility].  CSW addressed all family questions and concerns. CSW copied chart and added all important documents. CSW also set up patient transportation with [facility]. Clinical Social Worker will sign off for now as social work intervention is no longer needed.   Izadora Roehr, MSW, LCSWA [phone]  

## 2014-02-21 NOTE — Progress Notes (Signed)
Patient was discharged to a nursing facility by MD order; discharged instructions  with care notes sent to the facility ([facility]) with patient and her daughter; IV DIC; tele D/C;  skin intact; facility was called for report - nurse Stacy received report; no complaints at this time; patient will be escorted to the car by volunteer via wheelchair.

## 2014-05-28 ENCOUNTER — Emergency Department (HOSPITAL_COMMUNITY)
Admission: EM | Admit: 2014-05-28 | Discharge: 2014-05-29 | Discharge status: Against Medical Advice | Payer: Medicare Other | Attending: Emergency Medicine | Admitting: Emergency Medicine

## 2014-05-28 ENCOUNTER — Encounter (HOSPITAL_COMMUNITY): Payer: Self-pay | Admitting: Emergency Medicine

## 2014-05-28 DIAGNOSIS — G8929 Other chronic pain: Secondary | ICD-10-CM | POA: Diagnosis not present

## 2014-05-28 DIAGNOSIS — J45909 Unspecified asthma, uncomplicated: Secondary | ICD-10-CM | POA: Diagnosis not present

## 2014-05-28 DIAGNOSIS — F1020 Alcohol dependence, uncomplicated: Secondary | ICD-10-CM | POA: Diagnosis not present

## 2014-05-28 DIAGNOSIS — F17200 Nicotine dependence, unspecified, uncomplicated: Secondary | ICD-10-CM | POA: Diagnosis not present

## 2014-05-28 HISTORY — DX: Cocaine abuse, uncomplicated: F14.10

## 2014-05-28 LAB — CBC WITH DIFFERENTIAL/PLATELET
BASOS PCT: 0 % (ref 0–1)
Basophils Absolute: 0 10*3/uL (ref 0.0–0.1)
EOS ABS: 0.1 10*3/uL (ref 0.0–0.7)
Eosinophils Relative: 2 % (ref 0–5)
HCT: 44.1 % (ref 36.0–46.0)
Hemoglobin: 14.7 g/dL (ref 12.0–15.0)
Lymphocytes Relative: 36 % (ref 12–46)
Lymphs Abs: 2.8 10*3/uL (ref 0.7–4.0)
MCH: 29.8 pg (ref 26.0–34.0)
MCHC: 33.3 g/dL (ref 30.0–36.0)
MCV: 89.5 fL (ref 78.0–100.0)
Monocytes Absolute: 0.7 10*3/uL (ref 0.1–1.0)
Monocytes Relative: 9 % (ref 3–12)
NEUTROS PCT: 53 % (ref 43–77)
Neutro Abs: 4.1 10*3/uL (ref 1.7–7.7)
Platelets: 265 10*3/uL (ref 150–400)
RBC: 4.93 MIL/uL (ref 3.87–5.11)
RDW: 13.4 % (ref 11.5–15.5)
WBC: 7.8 10*3/uL (ref 4.0–10.5)

## 2014-05-28 LAB — COMPREHENSIVE METABOLIC PANEL
ALBUMIN: 3.9 g/dL (ref 3.5–5.2)
ALK PHOS: 95 U/L (ref 39–117)
ALT: 17 U/L (ref 0–35)
ANION GAP: 16 — AB (ref 5–15)
AST: 20 U/L (ref 0–37)
BUN: 9 mg/dL (ref 6–23)
CO2: 24 mEq/L (ref 19–32)
Calcium: 9.4 mg/dL (ref 8.4–10.5)
Chloride: 100 mEq/L (ref 96–112)
Creatinine, Ser: 0.86 mg/dL (ref 0.50–1.10)
GFR calc Af Amer: 85 mL/min — ABNORMAL LOW (ref >90)
GFR calc non Af Amer: 73 mL/min — ABNORMAL LOW (ref >90)
Glucose, Bld: 121 mg/dL — ABNORMAL HIGH (ref 70–99)
Potassium: 3.2 mEq/L — ABNORMAL LOW (ref 3.7–5.3)
SODIUM: 140 meq/L (ref 137–147)
TOTAL PROTEIN: 6.9 g/dL (ref 6.0–8.3)
Total Bilirubin: 0.4 mg/dL (ref 0.3–1.2)

## 2014-05-28 LAB — RAPID URINE DRUG SCREEN, HOSP PERFORMED
AMPHETAMINES: NOT DETECTED
BENZODIAZEPINES: NOT DETECTED
Barbiturates: NOT DETECTED
COCAINE: POSITIVE — AB
Opiates: POSITIVE — AB
Tetrahydrocannabinol: NOT DETECTED

## 2014-05-28 LAB — ETHANOL: Alcohol, Ethyl (B): 145 mg/dL — ABNORMAL HIGH (ref 0–11)

## 2014-05-28 NOTE — ED Notes (Signed)
Pt. reported alcohol intake and crack cocaine today , poor appetite , generalized weakness / productive cough .

## 2014-05-29 ENCOUNTER — Emergency Department (HOSPITAL_COMMUNITY)
Admission: EM | Admit: 2014-05-29 | Discharge: 2014-06-01 | Disposition: A | Discharge status: Other Acute Inpatient Hospital | Payer: Medicare Other | Source: Home / Self Care | Attending: Emergency Medicine | Admitting: Emergency Medicine

## 2014-05-29 ENCOUNTER — Emergency Department (HOSPITAL_COMMUNITY): Payer: Medicare Other

## 2014-05-29 ENCOUNTER — Encounter (HOSPITAL_COMMUNITY): Payer: Self-pay | Admitting: Emergency Medicine

## 2014-05-29 DIAGNOSIS — F17200 Nicotine dependence, unspecified, uncomplicated: Secondary | ICD-10-CM | POA: Diagnosis not present

## 2014-05-29 DIAGNOSIS — F10229 Alcohol dependence with intoxication, unspecified: Principal | ICD-10-CM | POA: Insufficient documentation

## 2014-05-29 DIAGNOSIS — M549 Dorsalgia, unspecified: Secondary | ICD-10-CM | POA: Insufficient documentation

## 2014-05-29 DIAGNOSIS — F329 Major depressive disorder, single episode, unspecified: Secondary | ICD-10-CM | POA: Diagnosis not present

## 2014-05-29 DIAGNOSIS — F10239 Alcohol dependence with withdrawal, unspecified: Secondary | ICD-10-CM

## 2014-05-29 DIAGNOSIS — F10939 Alcohol use, unspecified with withdrawal, unspecified: Secondary | ICD-10-CM | POA: Insufficient documentation

## 2014-05-29 DIAGNOSIS — F10930 Alcohol use, unspecified with withdrawal, uncomplicated: Secondary | ICD-10-CM

## 2014-05-29 DIAGNOSIS — F411 Generalized anxiety disorder: Secondary | ICD-10-CM

## 2014-05-29 DIAGNOSIS — F1420 Cocaine dependence, uncomplicated: Secondary | ICD-10-CM

## 2014-05-29 DIAGNOSIS — F1020 Alcohol dependence, uncomplicated: Secondary | ICD-10-CM | POA: Diagnosis present

## 2014-05-29 DIAGNOSIS — J45909 Unspecified asthma, uncomplicated: Secondary | ICD-10-CM | POA: Insufficient documentation

## 2014-05-29 DIAGNOSIS — Z79899 Other long term (current) drug therapy: Secondary | ICD-10-CM

## 2014-05-29 DIAGNOSIS — J449 Chronic obstructive pulmonary disease, unspecified: Secondary | ICD-10-CM | POA: Insufficient documentation

## 2014-05-29 DIAGNOSIS — G8929 Other chronic pain: Secondary | ICD-10-CM | POA: Insufficient documentation

## 2014-05-29 DIAGNOSIS — Z8679 Personal history of other diseases of the circulatory system: Secondary | ICD-10-CM

## 2014-05-29 DIAGNOSIS — J4489 Other specified chronic obstructive pulmonary disease: Secondary | ICD-10-CM | POA: Insufficient documentation

## 2014-05-29 DIAGNOSIS — F1110 Opioid abuse, uncomplicated: Secondary | ICD-10-CM | POA: Insufficient documentation

## 2014-05-29 DIAGNOSIS — F1423 Cocaine dependence with withdrawal: Secondary | ICD-10-CM

## 2014-05-29 DIAGNOSIS — F3289 Other specified depressive episodes: Secondary | ICD-10-CM | POA: Insufficient documentation

## 2014-05-29 DIAGNOSIS — F10230 Alcohol dependence with withdrawal, uncomplicated: Secondary | ICD-10-CM

## 2014-05-29 LAB — COMPREHENSIVE METABOLIC PANEL
ALBUMIN: 3.6 g/dL (ref 3.5–5.2)
ALT: 20 U/L (ref 0–35)
ANION GAP: 14 (ref 5–15)
AST: 26 U/L (ref 0–37)
Alkaline Phosphatase: 94 U/L (ref 39–117)
BUN: 8 mg/dL (ref 6–23)
CALCIUM: 8.9 mg/dL (ref 8.4–10.5)
CHLORIDE: 102 meq/L (ref 96–112)
CO2: 27 mEq/L (ref 19–32)
CREATININE: 0.9 mg/dL (ref 0.50–1.10)
GFR calc Af Amer: 80 mL/min — ABNORMAL LOW (ref >90)
GFR calc non Af Amer: 69 mL/min — ABNORMAL LOW (ref >90)
Glucose, Bld: 96 mg/dL (ref 70–99)
Potassium: 3.7 mEq/L (ref 3.7–5.3)
Sodium: 143 mEq/L (ref 137–147)
TOTAL PROTEIN: 6.4 g/dL (ref 6.0–8.3)
Total Bilirubin: 0.6 mg/dL (ref 0.3–1.2)

## 2014-05-29 LAB — URINALYSIS, ROUTINE W REFLEX MICROSCOPIC
BILIRUBIN URINE: NEGATIVE
Glucose, UA: NEGATIVE mg/dL
Ketones, ur: NEGATIVE mg/dL
Nitrite: NEGATIVE
PH: 5 (ref 5.0–8.0)
Protein, ur: 30 mg/dL — AB
SPECIFIC GRAVITY, URINE: 1.019 (ref 1.005–1.030)
Urobilinogen, UA: 0.2 mg/dL (ref 0.0–1.0)

## 2014-05-29 LAB — RAPID URINE DRUG SCREEN, HOSP PERFORMED
Amphetamines: NOT DETECTED
BENZODIAZEPINES: NOT DETECTED
Barbiturates: NOT DETECTED
COCAINE: POSITIVE — AB
OPIATES: POSITIVE — AB
Tetrahydrocannabinol: POSITIVE — AB

## 2014-05-29 LAB — CBC
HCT: 43.4 % (ref 36.0–46.0)
Hemoglobin: 14.7 g/dL (ref 12.0–15.0)
MCH: 29.7 pg (ref 26.0–34.0)
MCHC: 33.9 g/dL (ref 30.0–36.0)
MCV: 87.7 fL (ref 78.0–100.0)
PLATELETS: 275 10*3/uL (ref 150–400)
RBC: 4.95 MIL/uL (ref 3.87–5.11)
RDW: 13.6 % (ref 11.5–15.5)
WBC: 5.4 10*3/uL (ref 4.0–10.5)

## 2014-05-29 LAB — ACETAMINOPHEN LEVEL: Acetaminophen (Tylenol), Serum: <15 ug/mL (ref 10–30)

## 2014-05-29 LAB — URINE MICROSCOPIC-ADD ON

## 2014-05-29 LAB — I-STAT TROPONIN, ED: TROPONIN I, POC: 0 ng/mL (ref 0.00–0.08)

## 2014-05-29 LAB — SALICYLATE LEVEL: Salicylate Lvl: <2 mg/dL — ABNORMAL LOW (ref 2.8–20.0)

## 2014-05-29 LAB — ETHANOL: Alcohol, Ethyl (B): <11 mg/dL (ref 0–11)

## 2014-05-29 MED ORDER — THIAMINE HCL 100 MG/ML IJ SOLN
100.0000 mg | Freq: Every day | INTRAMUSCULAR | Status: DC
  Administered 2014-05-29: 100 mg via INTRAVENOUS
  Filled 2014-05-29: qty 2

## 2014-05-29 MED ORDER — LORAZEPAM 2 MG/ML IJ SOLN: 1.0000 mg | Freq: Four times a day (QID) | INTRAMUSCULAR | Status: DC | PRN

## 2014-05-29 MED ORDER — LORAZEPAM 2 MG/ML IJ SOLN
0.0000 mg | Freq: Four times a day (QID) | INTRAMUSCULAR | Status: DC
  Administered 2014-05-29: 2 mg via INTRAVENOUS
  Filled 2014-05-29: qty 2

## 2014-05-29 MED ORDER — RISPERIDONE 0.5 MG PO TABS
1.0000 mg | ORAL_TABLET | Freq: Every day | ORAL | Status: DC
  Administered 2014-05-29 – 2014-05-31 (×3): 1 mg via ORAL
  Filled 2014-05-29 (×3): qty 2

## 2014-05-29 MED ORDER — LORAZEPAM 1 MG PO TABS
1.0000 mg | ORAL_TABLET | Freq: Four times a day (QID) | ORAL | Status: DC | PRN
  Administered 2014-05-30: 1 mg via ORAL
  Filled 2014-05-29 (×2): qty 1

## 2014-05-29 MED ORDER — IPRATROPIUM-ALBUTEROL 0.5-2.5 (3) MG/3ML IN SOLN
3.0000 mL | Freq: Once | RESPIRATORY_TRACT | Status: AC
  Administered 2014-05-29: 3 mL via RESPIRATORY_TRACT
  Filled 2014-05-29: qty 3

## 2014-05-29 MED ORDER — ADULT MULTIVITAMIN W/MINERALS CH
1.0000 | ORAL_TABLET | Freq: Every day | ORAL | Status: DC
  Administered 2014-05-29 – 2014-06-01 (×4): 1 via ORAL
  Filled 2014-05-29 (×4): qty 1

## 2014-05-29 MED ORDER — LORAZEPAM 1 MG PO TABS
0.0000 mg | ORAL_TABLET | Freq: Two times a day (BID) | ORAL | Status: DC
  Administered 2014-05-31: 1 mg via ORAL
  Filled 2014-05-29: qty 1

## 2014-05-29 MED ORDER — ACETAMINOPHEN 325 MG PO TABS: 650.0000 mg | ORAL_TABLET | ORAL | Status: DC | PRN

## 2014-05-29 MED ORDER — FOLIC ACID 1 MG PO TABS
1.0000 mg | ORAL_TABLET | Freq: Every day | ORAL | Status: DC
  Administered 2014-05-29 – 2014-06-01 (×4): 1 mg via ORAL
  Filled 2014-05-29 (×4): qty 1

## 2014-05-29 MED ORDER — FLUOXETINE HCL 20 MG PO CAPS
40.0000 mg | ORAL_CAPSULE | Freq: Every day | ORAL | Status: DC
  Administered 2014-05-29 – 2014-06-01 (×4): 40 mg via ORAL
  Filled 2014-05-29 (×4): qty 2

## 2014-05-29 MED ORDER — LORAZEPAM 1 MG PO TABS
0.0000 mg | ORAL_TABLET | Freq: Four times a day (QID) | ORAL | Status: AC
  Administered 2014-05-29 – 2014-05-31 (×5): 1 mg via ORAL
  Filled 2014-05-29 (×4): qty 1

## 2014-05-29 MED ORDER — ONDANSETRON HCL 4 MG/2ML IJ SOLN
4.0000 mg | Freq: Once | INTRAMUSCULAR | Status: AC
Start: 2014-05-29 — End: 2014-05-29
  Administered 2014-05-29: 4 mg via INTRAVENOUS
  Filled 2014-05-29: qty 2

## 2014-05-29 MED ORDER — TRAZODONE HCL 50 MG PO TABS
50.0000 mg | ORAL_TABLET | Freq: Every evening | ORAL | Status: DC | PRN
  Administered 2014-05-29 – 2014-05-31 (×3): 50 mg via ORAL
  Filled 2014-05-29 (×3): qty 1

## 2014-05-29 MED ORDER — VITAMIN B-1 100 MG PO TABS
100.0000 mg | ORAL_TABLET | Freq: Every day | ORAL | Status: DC
  Administered 2014-05-30 – 2014-06-01 (×3): 100 mg via ORAL
  Filled 2014-05-29 (×3): qty 1

## 2014-05-29 MED ORDER — LORAZEPAM 2 MG/ML IJ SOLN: 0.0000 mg | Freq: Two times a day (BID) | INTRAMUSCULAR | Status: DC

## 2014-05-29 MED ORDER — SODIUM CHLORIDE 0.9 % IV BOLUS (SEPSIS)
1000.0000 mL | Freq: Once | INTRAVENOUS | Status: AC
  Administered 2014-05-29: 1000 mL via INTRAVENOUS

## 2014-05-29 MED ORDER — IBUPROFEN 400 MG PO TABS: 600.0000 mg | ORAL_TABLET | Freq: Three times a day (TID) | ORAL | Status: DC | PRN

## 2014-05-29 NOTE — ED Notes (Signed)
Pt left refused to stay

## 2014-05-29 NOTE — ED Provider Notes (Signed)
CSN: [id number]     Arrival date & time 05/29/14  1240 History   First MD Initiated Contact with Patient 05/29/14 1332     Chief Complaint  Patient presents with  . Drug / Alcohol Assessment     (Consider location/radiation/quality/duration/timing/severity/associated sxs/prior Treatment) HPI [name] is a 58 year old female with past medical history depression, cocaine, alcohol dependence, COPD who presents today requesting alcohol and cocaine detox. Patient also complains of a cough and shortness of breath times one week as well as generalized weakness since yesterday. Patient states she was in the ER yesterday requesting detox, however left prior to being seen. Patient states the weakness began she is waiting in the ER and she believes she is feeling the weakness because she is hungry as she has not eaten in the past 3 days. Patient states her cough has been present for one week, with mild shortness of breath. She reports having chills, however has not checked her temperature at home. She states her cough has not been productive. Patient states  Past Medical History  Diagnosis Date  . Depression   . Migraine headache   . Chronic back pain   . Alcohol dependence 06/11/2013  . Asthma   . Cocaine abuse    Past Surgical History  Procedure Laterality Date  . No past surgeries     No family history on file. History  Substance Use Topics  . Smoking status: Current Every Day Smoker -- 1.00 packs/day for 35 years    Types: Cigarettes  . Smokeless tobacco: Never Used  . Alcohol Use: 0.0 oz/week     Comment: fifth liquor daily - At [facility] [address] now to stop drinking   OB History   Grav Para Term Preterm Abortions TAB SAB Ect Mult Living                 Review of Systems  Constitutional: Positive for chills. Negative for fatigue.  HENT: Negative for sore throat and trouble swallowing.   Eyes: Negative for visual disturbance.  Respiratory: Positive for cough, shortness of breath and  wheezing.   Cardiovascular: Negative for chest pain, palpitations and leg swelling.  Gastrointestinal: Positive for nausea and vomiting. Negative for abdominal pain, diarrhea and blood in stool.  Endocrine: Negative for polyuria.  Genitourinary: Negative for dysuria.  Musculoskeletal: Negative for arthralgias, back pain, gait problem and neck pain.  Skin: Negative for rash.  Neurological: Negative for dizziness, syncope, weakness, numbness and headaches.  Psychiatric/Behavioral: Negative for suicidal ideas.      Allergies  Review of patient's allergies indicates no known allergies.  Home Medications   Prior to Admission medications   Medication Sig Start Date End Date Taking? Authorizing Provider  FLUoxetine (PROZAC) 40 MG capsule Take 1 capsule (40 mg total) by mouth daily. For depression 06/14/13  Yes Encarnacion Slates, NP  Multiple Vitamin (MULTIVITAMIN WITH MINERALS) TABS tablet Take 1 tablet by mouth daily.   Yes Historical Provider, MD  risperiDONE (RISPERDAL) 1 MG tablet Take 1 tablet (1 mg total) by mouth at bedtime. For mood control 06/14/13  Yes Encarnacion Slates, NP  traZODone (DESYREL) 50 MG tablet Take 1 tablet (50 mg total) by mouth at bedtime and may repeat dose one time if needed. For sleep 06/14/13  Yes Encarnacion Slates, NP   BP 132/96  Pulse 92  Temp(Src) 98.1 F (36.7 C) (Oral)  Resp 21  SpO2 93% Physical Exam  Constitutional: She is oriented to person, place, and time.  Patient  well-nourished, well-developed female who appears anxious and is in mild respiratory distress.   Cardiovascular: Normal rate, regular rhythm, S1 normal, S2 normal and normal pulses.  Exam reveals no gallop.   No murmur heard. Pulmonary/Chest: Tachypnea noted. She has wheezes in the right upper field, the right middle field, the left upper field and the left middle field. She has rhonchi in the right lower field and the left lower field.  Patient in mild respiratory distress. Patient able to speak in  full, clear sentences.  Abdominal: Soft. Normal appearance and bowel sounds are normal. There is no tenderness.  Musculoskeletal: Normal range of motion.  Neurological: She is alert and oriented to person, place, and time. She displays tremor. No cranial nerve deficit or sensory deficit.  Patient initially has global weakness with motor strength 4/5 in all major muscle groups. Obvious tremor noted. Patient cooperative and answering questions appropriately.  Skin: Skin is warm and dry.  Psychiatric: Her speech is normal and behavior is normal. Judgment and thought content normal. Her mood appears anxious. Cognition and memory are normal. She exhibits a depressed mood. She expresses no homicidal and no suicidal ideation. She expresses no suicidal plans and no homicidal plans.    ED Course  Procedures (including critical care time) Labs Review Labs Reviewed  COMPREHENSIVE METABOLIC PANEL - Abnormal; Notable for the following:    GFR calc non Af Amer 69 (*)    GFR calc Af Amer 80 (*)    All other components within normal limits  SALICYLATE LEVEL - Abnormal; Notable for the following:    Salicylate Lvl <0.1 (*)    All other components within normal limits  URINE RAPID DRUG SCREEN (HOSP PERFORMED) - Abnormal; Notable for the following:    Opiates POSITIVE (*)    Cocaine POSITIVE (*)    Tetrahydrocannabinol POSITIVE (*)    All other components within normal limits  URINALYSIS, ROUTINE W REFLEX MICROSCOPIC - Abnormal; Notable for the following:    APPearance HAZY (*)    Hgb urine dipstick LARGE (*)    Protein, ur 30 (*)    Leukocytes, UA TRACE (*)    All other components within normal limits  URINE MICROSCOPIC-ADD ON - Abnormal; Notable for the following:    Squamous Epithelial / LPF FEW (*)    Bacteria, UA MANY (*)    All other components within normal limits  CBC  ETHANOL  ACETAMINOPHEN LEVEL  I-STAT TROPOININ, ED    Imaging Review Dg Chest 2 View  05/29/2014   CLINICAL DATA:   Crackles in the lungs.  Weakness.  EXAM: CHEST  2 VIEW  COMPARISON:  06/26/2013  FINDINGS: The heart size and mediastinal contours are within normal limits. Both lungs are clear. The visualized skeletal structures are unremarkable.  IMPRESSION: Normal chest.   Electronically Signed   By: Rozetta Nunnery M.D.   On: 05/29/2014 14:32     EKG Interpretation None      MDM   Final diagnoses:  None    58 year old female with past medical history of alcohol, cocaine dependence, depression, COPD presenting requesting detox from alcohol, crack/cocaine. Patient also has complaints of generalized weakness, shortness of breath. Initial workup to help manage symptoms as well as facilitate medical clearance for psych. Workup as noted above. Patient's weakness is not focal in nature, and she does have tremors which are more indicative of possible withdrawals. Patient treated with albuterol inhaler for symptomatic shortness of breath, and given fluid bolus for possible dehydration. Radiographs  unremarkable for any acute findings. Tox screen positive for opiates, cocaine, THC. CIWA protocol initiated for likely withdrawal signs and symptoms.  1600: After treatment with albuterol, fluid bolus, and meal, patient states she is feeling better at this time. Mild tremor still noted. Patient on CIWA protocol for possible withdrawal with vitals stable. Respiratory distress subsided status post breathing treatment, patient states her breathing feels "much better". Patient placed on regular diet for psych hold.  Patient medically cleared at this time for Psych hold.    Filed Vitals:   05/29/14 1730  BP: 132/96  Pulse: 92  Temp:   Resp: 21   Signed,  Katherine Bailiff, PA-C 8:43 PM   This patient seen and discussed with Dr. Shirlyn Goltz, MD.      Katherine Mew, PA-C 05/29/14 2044

## 2014-05-29 NOTE — ED Notes (Signed)
Patient transported to X-ray 

## 2014-05-29 NOTE — BH Assessment (Signed)
Assessment Note  [name] is an 58 y.o. female with history of COPD presents today with help to detox from drugs and alcohol. Pt reports a history of depression and anxiety that she reports began before using drugs and worsened after using drugs and alcohol. Pt denies SI/HI, self-harm, and A/V hallucinations. Pt reports she has had SI in the past and reports one attempt "about 10 years ago I cut my arm."  Pt reports she is under the care of a doctor at [place] and is prescribed Trazodone, Prozac, and Risperdal. Pt sts her medication compliance is not good as she often forgets to take her medication. "I take it occasionally."   Pt initially denied depressive symptoms then reported recently she has felt down, isolating, worthless, hopeless, helpless, experiencing crying spells, and decreased sleeping and eating.   Pt reports she often feels anxious and worried about things with panic attacks about once or twice per month. She denies hx of trauma or PTSD symptoms.  Pt tested positive cocaine, benzos. And THC. She did not test positive for alcohol despite reports of drinking 12 pack of beer daily. Pt reports she uses marijuana "once in awhile." Pt uses 1 gram cocaine and 12 pack a day of beer.   Pt is requesting detox due to overall not feeling well most of the time.   Axis I: 304.20 Cocaine Use Disorder, Severe           303.90 Alcohol Use Disorder, Severe            Rule out anxiolytic use disorder         311 Depressive Disorder Unspecified          300 Anxiety Disorder Unspecified            Axis II: Deferred Axis III:  Past Medical History  Diagnosis Date  . Depression   . Migraine headache   . Chronic back pain   . Alcohol dependence 06/11/2013  . Asthma   . Cocaine abuse    Axis IV: economic problems, occupational problems, other psychosocial or environmental problems, problems related to legal system/crime and problems with access to health care services Axis V:  87  Past Medical History:  Past Medical History  Diagnosis Date  . Depression   . Migraine headache   . Chronic back pain   . Alcohol dependence 06/11/2013  . Asthma   . Cocaine abuse     Past Surgical History  Procedure Laterality Date  . No past surgeries      Family History: No family history on file.  Social History:  reports that she has been smoking Cigarettes.  She has a 35 pack-year smoking history. She has never used smokeless tobacco. She reports that she drinks alcohol. She reports that she does not use illicit drugs.  Additional Social History:  Alcohol / Drug Use Pain Medications: SEE MAR, denies use Prescriptions: SEE MAR, reports does not take daily as she forgets Over the Counter: denies History of alcohol / drug use?: Yes Longest period of sobriety (when/how long): 2 years date and method not recalled by pt, denies hx of seizures with withdrawal Negative Consequences of Use:  (denies) Withdrawal Symptoms:  (tremors) Substance #1 Name of Substance 1: alcohol  1 - Age of First Use: 16 1 - Amount (size/oz): 12 pack of beers, and occassionally liquor amount unknown 1 - Frequency: "years" 1 - Duration: daily, but did not test positive for etoh today 1 - Last  Use / Amount: yesterday 12 pack of beer Substance #2 Name of Substance 2: marijuana 2 - Age of First Use: [age]  2 - Amount (size/oz): 1 joint 2 - Frequency: "once in awhile 2 - Duration: "years" 2 - Last Use / Amount: yesterday 1 joint Substance #3 Name of Substance 3: cocaine 3 - Age of First Use: unsure 3 - Amount (size/oz): gram 3 - Frequency: daily 3 - Duration: reports one year at this level 3 - Last Use / Amount: yestrerday 1 gram via smoking  CIWA: CIWA-Ar BP: 132/96 mmHg Pulse Rate: 92 Nausea and Vomiting: no nausea and no vomiting Tactile Disturbances: none Tremor: not visible, but can be felt fingertip to fingertip Auditory Disturbances: not present Paroxysmal Sweats: no sweat  visible Visual Disturbances: not present Anxiety: three Headache, Fullness in Head: none present Agitation: three Orientation and Clouding of Sensorium: oriented and can do serial additions CIWA-Ar Total: 7 COWS:    Allergies: No Known Allergies  Home Medications:  (Not in a hospital admission)  OB/GYN Status:  No LMP recorded. Patient is postmenopausal.  General Assessment Data Location of Assessment: [facility] ED Is this a Tele or Face-to-Face Assessment?: Tele Assessment Is this an Initial Assessment or a Re-assessment for this encounter?: Initial Assessment Living Arrangements: Alone Can pt return to current living arrangement?: Yes Admission Status: Voluntary Is patient capable of signing voluntary admission?: Yes Transfer from: Home Referral Source: Self/Family/Friend     [place] Living Arrangements: Alone Name of Psychiatrist: at [facility] Name of Therapist: none  Education Status Is patient currently in school?: No Current Grade: na Highest grade of school patient has completed: 10 Contact person: na  Risk to self with the past 6 months Suicidal Intent: No Is patient at risk for suicide?: No Suicidal Plan?: No Access to Means: No What has been your use of drugs/alcohol within the last 12 months?: Pt has a history of alcohol, cocaine, and benzo use/abuse. She currently denies use of benzos but did test positive for them. She reports daily use of one gram cocaine via smoking, 12 beers per day, and occassional use of mairjuana Previous Attempts/Gestures: Yes How many times?: 1 (reports 10 years ago she cut arm) Other Self Harm Risks: none Triggers for Past Attempts: Unknown Intentional Self Injurious Behavior: None Family Suicide History: No Recent stressful life event(s):  (denies) Persecutory voices/beliefs?: No Depression: Yes Depression Symptoms: Insomnia;Tearfulness;Isolating;Loss of interest in usual pleasures;Feeling angry/irritable;Feeling  worthless/self pity Substance abuse history and/or treatment for substance abuse?: Yes Suicide prevention information given to non-admitted patients: Not applicable  Risk to Others within the past 6 months Homicidal Ideation: No Thoughts of Harm to Others: No Current Homicidal Intent: No Current Homicidal Plan: No Access to Homicidal Means: No Identified Victim: none History of harm to others?: No Assessment of Violence: None Noted Violent Behavior Description: none Does patient have access to weapons?: No Criminal Charges Pending?: Yes Describe Pending Criminal Charges: marijuana possession Does patient have a court date: Yes Court Date: 06/11/14  Psychosis Hallucinations: None noted Delusions: None noted  Mental Status Report Appear/Hygiene: Disheveled;In scrubs Eye Contact: Good Motor Activity:  (coughing) Speech: Logical/coherent Level of Consciousness: Alert Mood: Depressed Affect: Flat Anxiety Level: Moderate Thought Processes: Coherent;Relevant Judgement: Impaired Orientation: Person;Place;Time;Situation Obsessive Compulsive Thoughts/Behaviors: None  Cognitive Functioning Concentration: Normal Memory: Recent Intact;Remote Intact IQ: Average Insight: Fair Impulse Control: Poor Appetite: Poor Weight Loss: 60 (per pt report) Weight Gain: 0 Sleep: Decreased Total Hours of Sleep: 0 (due to  coughing per pt) Vegetative Symptoms: None  ADLScreening [facility] Assessment Services) Patient's cognitive ability adequate to safely complete daily activities?: Yes Patient able to express need for assistance with ADLs?: Yes Independently performs ADLs?: Yes (appropriate for developmental age)  Prior Inpatient Therapy Prior Inpatient Therapy: Yes Prior Therapy Dates: 06/11/13 [facility] Prior Therapy Facilty/Provider(s): [facility] Reason for Treatment: SA, depression  Prior Outpatient Therapy Prior Outpatient Therapy: Yes Prior Therapy Dates: current Prior Therapy  Facilty/Provider(s): [facility] Reason for Treatment: medication management  ADL Screening (condition at time of admission) Patient's cognitive ability adequate to safely complete daily activities?: Yes Is the patient deaf or have difficulty hearing?: No Does the patient have difficulty seeing, even when wearing glasses/contacts?: No Does the patient have difficulty concentrating, remembering, or making decisions?: No Patient able to express need for assistance with ADLs?: Yes Does the patient have difficulty dressing or bathing?: No Independently performs ADLs?: Yes (appropriate for developmental age)       Abuse/Neglect Assessment (Assessment to be complete while patient is alone) Physical Abuse: Denies Verbal Abuse: Denies Self-Neglect: Denies Values / Beliefs Cultural Requests During Hospitalization: None Spiritual Requests During Hospitalization:  [facility] per pt)   Advance Directives (For Healthcare) Advance Directive: Patient does not have advance directive Pre-existing out of facility DNR order (yellow form or pink MOST form): No Nutrition Screen- [facility] Adult/WL/AP Patient's home diet: Regular  Additional Information 1:1 In Past 12 Months?: No CIRT Risk: No Elopement Risk: No Does patient have medical clearance?: Yes     Disposition:  Pt meets inpt criteria per E. Burkett, NP. TTS will seek detox placement as no beds are open at [facility]. EDP in agreement with plan. Nurse will inform pt.   Lear Ng, [facility] Triage Specialist 05/29/2014 10:08 PM   On Site Evaluation by:   Reviewed with Physician:    Rhona Raider 05/29/2014 9:58 PM

## 2014-05-29 NOTE — BH Assessment (Signed)
Reviewed EDP note states pt is medically cleared. Pt has hx of substance induced mood disorder, etoh dependence and cocaine dependence, as well as COPD.   Eddie Dibbles will have TA equipment set up.  TA to commence shortly.   Lear Ng, [facility] Triage Specialist 05/29/2014 8:58 PM

## 2014-05-29 NOTE — ED Notes (Signed)
PA student at bedside at this time 

## 2014-05-29 NOTE — ED Notes (Addendum)
Pt reports wanting detox from alcohol and cocaine. Last use was yesterday of crack and alcohol. Reports generalized weakness. Denies SI/HI. Denies hallucinations. State here last night for same, but couldn't wait any longer and left AMA. Pt actively trembling in triage, appears anxious.

## 2014-05-29 NOTE — BH Assessment (Signed)
Relayed results of assessment to Norville Haggard, NP. Per Norville Haggard, NP pt meets inpt criteria for detox, and TTS can seek placement as there are not available beds at [facility].   Spoke with EDP provider, Dr. Jeanell Sparrow who is in agreement with plan to seek placement.   Informed Eddie Dibbles, RN about the plan and she will relay to pt.   Lear Ng, [facility] Triage Specialist 05/29/2014 9:32 PM

## 2014-05-29 NOTE — ED Notes (Signed)
Pt up ambulatory with 2 person assistance to the bathroom to attempt to provide an urine sample

## 2014-05-29 NOTE — ED Notes (Signed)
Pt gave urine sample at this time and is being sent to lab for testing

## 2014-05-30 MED ORDER — BENZONATATE 100 MG PO CAPS
100.0000 mg | ORAL_CAPSULE | Freq: Three times a day (TID) | ORAL | Status: DC | PRN
  Administered 2014-05-30 – 2014-05-31 (×3): 100 mg via ORAL
  Filled 2014-05-30 (×3): qty 1

## 2014-05-30 NOTE — BH Assessment (Signed)
Due to no current [facility] bed availability faxed referrals to: [facility] [place] Triage Specialist 05/30/2014 1:49 AM

## 2014-05-30 NOTE — ED Provider Notes (Signed)
Medical screening examination/treatment/procedure(s) were conducted as a shared visit with non-physician practitioner(s) and myself.  I personally evaluated the patient during the encounter.   EKG Interpretation None      [name] is a 58 y.o. female hx of alcohol and drug abuse here wanting detox. Drinks 12 beers daily and vodka and does cocaine. Came yesterday but didn't wait to be seen. Drank more beers last night. Also hasn't been eating much and has diffuse weakness and cough. On exam, disheveled, coughing. Mildly tachy. Some resting tremors and appears anxious. Nl strength throughout. Some bilateral wheezing vs rhonchi on lung exam. Abdomen nontender. I think she is likely is mild withdrawal from alcohol and is started on CIWA. CXR showed no infiltrate so likely has bronchitis so is given albuterol. UDS + opiates, cocaine, marijuana. Labs otherwise unremarkable. Stable for TTS eval.    Wandra Arthurs, MD 05/30/14 1459

## 2014-05-30 NOTE — ED Notes (Signed)
Patient's sister called and patient spoke with her briefly.   Patient ambulated to restroom and tolerated well.

## 2014-05-30 NOTE — ED Notes (Signed)
[facility] -- no beds for [facility] patients (female or female)

## 2014-05-30 NOTE — ED Notes (Signed)
Spoke with sister on phone. Does not want ativan at present, has been sleeping.

## 2014-05-31 ENCOUNTER — Encounter (HOSPITAL_COMMUNITY): Payer: Self-pay | Admitting: Emergency Medicine

## 2014-05-31 MED ORDER — IPRATROPIUM-ALBUTEROL 0.5-2.5 (3) MG/3ML IN SOLN
3.0000 mL | Freq: Once | RESPIRATORY_TRACT | Status: AC
  Administered 2014-05-31: 3 mL via RESPIRATORY_TRACT
  Filled 2014-05-31: qty 3

## 2014-05-31 NOTE — Progress Notes (Addendum)
1351- received phone call back from [place] at [facility].  States that he is currently presenting referral to their MD and will return call to TTS once review complete.  1400- received phone call back from [place] at [facility], pt has been declined d/t acuity.    Rick Duff Disposition MHT

## 2014-05-31 NOTE — Progress Notes (Signed)
Called to administer neb tx. Pt sleeping upon arrival. No obvious respiratory distress noted. Expiratory wheeze noted bilaterally. Pt tolerated well. RT will continue to monitor.

## 2014-05-31 NOTE — ED Notes (Signed)
Pt eating dinner

## 2014-05-31 NOTE — Progress Notes (Signed)
Follow-up calls have been placed to the following facilities that pt's referral has been faxed to: [facility]- per [place] no adult beds available [facility]- per Kasandra Knudsen currently at [facility]- per [facility] referral not received, referral re-faxed  Pt's referral faxed to the following: [facility]- per [place] beds available [facility]- per [place] 1 MCD female bed available [facility]- per Marlou Sa can fax Sharlene Motts- per Roselie Awkward can fax [facility] Camel- per [facility] [place] 1 female 55+ bed available  The following facilities with SA programming have also been contacted and are at capacity: [place]- per [place]- per [facility] Maxin- per [facility]- per [facility]- per [facility]- per [facility]- per [facility]- per Paul Dykes- per [facility]- per Theodora Blow- per [facility]- per [place] per [facility]  **d/t pt having MCR/MCD detox facilities [facility], [facility] and [place] not an option.   Rick Duff Disposition MHT

## 2014-05-31 NOTE — BH Assessment (Addendum)
[place] Assessment Progress Note  Update:  Pt seen this day for reassessment after appt scheduled with pt's nurse, Santiago Glad, for 0800.  Pt is at [facility] requesting detox from alcohol and cocaine.  Pt is currently cooperative, calm, quiet, alert, oriented x 4, has blunted affect, logical/coherent thought processes, and normal speech.  Pt stated she slept, "OK," waking through the night.  She stated her appetite "is coming back."  Pt stated she still wants detox and is "not feeling too good."  She reports her current withdrawal sx include headache and craving alcohol and drugs.  Pt continues to be motivated for detox.  Pt is pending several facilities and TTS will continue to seek detox placement for the pt.  Updated ED and TTS staff.  Shaune Pascal, MS, [facility] Licensed Professional Counselor Triage Specialist

## 2014-05-31 NOTE — ED Notes (Signed)
Pt continues to cough when awake, sounding deeper, more productive.

## 2014-05-31 NOTE — ED Notes (Signed)
Pt has deep, dry, occasionally productive cough. Spoke with dr. Reather Converse -- orders received.

## 2014-06-01 ENCOUNTER — Encounter (HOSPITAL_COMMUNITY): Payer: Self-pay | Admitting: *Deleted

## 2014-06-01 ENCOUNTER — Observation Stay (HOSPITAL_COMMUNITY)
Admission: AD | Admit: 2014-06-01 | Discharge: 2014-06-02 | Disposition: A | Discharge status: Home / Self Care | Payer: Medicare Other | Source: Intra-hospital | Attending: Psychiatry | Admitting: Psychiatry

## 2014-06-01 DIAGNOSIS — F1920 Other psychoactive substance dependence, uncomplicated: Secondary | ICD-10-CM

## 2014-06-01 DIAGNOSIS — F331 Major depressive disorder, recurrent, moderate: Secondary | ICD-10-CM

## 2014-06-01 DIAGNOSIS — F1120 Opioid dependence, uncomplicated: Secondary | ICD-10-CM | POA: Diagnosis present

## 2014-06-01 DIAGNOSIS — F11220 Opioid dependence with intoxication, uncomplicated: Secondary | ICD-10-CM

## 2014-06-01 MED ORDER — VITAMIN B-1 100 MG PO TABS
100.0000 mg | ORAL_TABLET | Freq: Every day | ORAL | Status: DC
  Administered 2014-06-02: 100 mg via ORAL
  Filled 2014-06-01 (×3): qty 1

## 2014-06-01 MED ORDER — NICOTINE 21 MG/24HR TD PT24
MEDICATED_PATCH | TRANSDERMAL | Status: AC
  Filled 2014-06-01: qty 1

## 2014-06-01 MED ORDER — HYDROXYZINE HCL 25 MG PO TABS
25.0000 mg | ORAL_TABLET | Freq: Four times a day (QID) | ORAL | Status: DC | PRN
  Administered 2014-06-01: 25 mg via ORAL
  Filled 2014-06-01: qty 1

## 2014-06-01 MED ORDER — RISPERIDONE 1 MG PO TABS
1.0000 mg | ORAL_TABLET | Freq: Every day | ORAL | Status: DC
  Administered 2014-06-01: 1 mg via ORAL
  Filled 2014-06-01 (×3): qty 1

## 2014-06-01 MED ORDER — FOLIC ACID 1 MG PO TABS
1.0000 mg | ORAL_TABLET | Freq: Every day | ORAL | Status: DC
  Administered 2014-06-02: 1 mg via ORAL
  Filled 2014-06-01 (×3): qty 1

## 2014-06-01 MED ORDER — ACETAMINOPHEN 325 MG PO TABS: 650.0000 mg | ORAL_TABLET | ORAL | Status: DC | PRN

## 2014-06-01 MED ORDER — FLUOXETINE HCL 20 MG PO CAPS
40.0000 mg | ORAL_CAPSULE | Freq: Every day | ORAL | Status: DC
  Administered 2014-06-02: 40 mg via ORAL
  Filled 2014-06-01 (×3): qty 2

## 2014-06-01 MED ORDER — BENZONATATE 100 MG PO CAPS
100.0000 mg | ORAL_CAPSULE | Freq: Three times a day (TID) | ORAL | Status: DC | PRN
  Administered 2014-06-01 – 2014-06-02 (×3): 100 mg via ORAL
  Filled 2014-06-01 (×3): qty 1

## 2014-06-01 MED ORDER — ADULT MULTIVITAMIN W/MINERALS CH
1.0000 | ORAL_TABLET | Freq: Every day | ORAL | Status: DC
  Administered 2014-06-02: 1 via ORAL
  Filled 2014-06-01 (×3): qty 1

## 2014-06-01 MED ORDER — NICOTINE 21 MG/24HR TD PT24
21.0000 mg | MEDICATED_PATCH | Freq: Every day | TRANSDERMAL | Status: DC
  Administered 2014-06-01: 21 mg via TRANSDERMAL
  Filled 2014-06-01 (×2): qty 1
  Filled 2014-06-01: qty 14

## 2014-06-01 MED ORDER — CHLORDIAZEPOXIDE HCL 25 MG PO CAPS
25.0000 mg | ORAL_CAPSULE | Freq: Four times a day (QID) | ORAL | Status: DC | PRN
  Administered 2014-06-01: 25 mg via ORAL
  Filled 2014-06-01: qty 1

## 2014-06-01 MED ORDER — TRAZODONE HCL 50 MG PO TABS
50.0000 mg | ORAL_TABLET | Freq: Every evening | ORAL | Status: DC | PRN
  Administered 2014-06-01 – 2014-06-02 (×2): 50 mg via ORAL
  Filled 2014-06-01 (×6): qty 1

## 2014-06-01 MED ORDER — ONDANSETRON 4 MG PO TBDP: 4.0000 mg | ORAL_TABLET | Freq: Four times a day (QID) | ORAL | Status: DC | PRN

## 2014-06-01 MED ORDER — IBUPROFEN 600 MG PO TABS: 600.0000 mg | ORAL_TABLET | Freq: Three times a day (TID) | ORAL | Status: DC | PRN

## 2014-06-01 MED ORDER — CHLORDIAZEPOXIDE HCL 25 MG PO CAPS
25.0000 mg | ORAL_CAPSULE | Freq: Once | ORAL | Status: AC
  Administered 2014-06-01: 25 mg via ORAL

## 2014-06-01 MED ORDER — LOPERAMIDE HCL 2 MG PO CAPS: 2.0000 mg | ORAL_CAPSULE | ORAL | Status: DC | PRN

## 2014-06-01 NOTE — Progress Notes (Signed)
Patient ID: [name], female   DOB: 05-Jul-1956, 58 y.o.   MRN: [id number] Admitted to Obs. Unit. Wants detox.  Denies HI, SI, AVH. CIWA 0. Last use of Cocaine or ETOH one week ago. Depressed and anxious.Oriented to unit. Nutrition offered. Education provided regarding safety and falls. Safety checks started every 15 minutes.

## 2014-06-01 NOTE — ED Notes (Signed)
Patient is at the end of her alcohol detox. States she would like to go to a long term treatment facility. Will discuss with bh today to formulate a plan for pt. She is warm and dry. She has no tremor. She is eating well.

## 2014-06-01 NOTE — Progress Notes (Signed)
Patient ID: [name], female   DOB: December 09, 1955, 58 y.o.   MRN: [id number]  D: Patient pleasant and cooperative with care. Pt with c/o anxiety and withdraw symptoms. Pt compliant with medications. No inappropriate behaviors noted. A: Q 15 minute safety checks, administer medications as ordered by provider. R: No s/s of distress noted at this time.

## 2014-06-01 NOTE — Plan of Care (Signed)
[place] Observation Crisis Plan  Reason for Crisis Plan:  Substance Abuse   Plan of Care:  Referral for Substance Abuse  Family Support:      Current Living Environment:  Living Arrangements: Alone  Insurance:   Hospital Account   Name Acct ID Class Status Primary Coverage   [place], Gemmer [id number] [place] - MEDICARE PART A AND B        Guarantor Account (for Hospital Account [id number])   Name Relation to Pt Service Area Active? Acct Type   Peden, Noel Christmas Self [facility] Yes Behavioral Health   Address Phone       [address] [place], [place] [address] [phone])          Coverage Information (for Hospital Account [id number])   F/O Payor/Plan Precert #   MEDICARE/MEDICARE PART A AND B    Subscriber Subscriber #   Jerolyn, [place] [id number]   Address Phone   [address] [place],  [address]       Legal Guardian:     Primary Care Provider:  [name], MD  Current Outpatient Providers:  not known  Psychiatrist:     Counselor/Therapist:     Compliant with Medications:  No  Additional Information:   Debbrah Alar 8/9/20153:43 PM

## 2014-06-01 NOTE — Progress Notes (Signed)
Patient ID: [name], female   DOB: 1956-10-10, 58 y.o.   MRN: [id number] [facility] INPATIENT:  Family/Significant Other Suicide Prevention Education  Suicide Prevention Education:  Patient Refusal for Family/Significant Other Suicide Prevention Education: The patient [name] has refused to provide written consent for family/significant other to be provided Family/Significant Other Suicide Prevention Education during admission and/or prior to discharge.  Physician notified.  Debbrah Alar 06/01/2014, 4:09 PM

## 2014-06-01 NOTE — [facility] Suicide Risk Assessment (Signed)
Suicide Risk Assessment  Admission Assessment     Nursing information obtained from:   Chart notes Demographic factors:  disabled Current Mental Status:  Stable withdrawal.Ready for residential; treatment Loss Factors:  Self esteem/ Historical Factors: chronic Alcoholism /Crack cocaine dependency/SIMD  Risk Reduction Factors:  Family support/desire for improvement Total Time spent with patient: 20 minutes  CLINICAL FACTORS:   Alcohol/Substance Abuse/Dependencies Chronic Pain Previous Psychiatric Diagnoses and Treatments Medical Diagnoses and Treatments/Surgeries  Psychiatric Specialty Exam:     Blood pressure 146/86, pulse 69.There is no weight on file to calculate BMI.  General Appearance: Disheveled  Eye Contact::  Poor  Speech:  Slow and Difficult to understand at times  Volume:  Decreased  Mood:  Dysphoric  Affect:  Congruent  Thought Process:  Intact  Orientation:  Full (Time, Place, and Person)  Thought Content:  WDL for condition (Addict)  Suicidal Thoughts:  No  Homicidal Thoughts:  No  Memory:  Negative  Judgement:  Impaired  Insight:  Lacking  Psychomotor Activity:  Decreased  Concentration:  Poor  Recall:  [place] of Knowledge:Fair  Language: Fair  Akathisia:  NA  Handed:  Right  AIMS (if indicated):  NA  Assets:  Desire for Improvement Financial Resources/Insurance  Sleep:  Drug disturbed   Musculoskeletal: Strength & Muscle Tone: decreased Gait & Station: normal Patient leans: N/A  COGNITIVE FEATURES THAT CONTRIBUTE TO RISK:  Loss of executive function    SUICIDE RISK:   Minimal: No identifiable suicidal ideation.  Patients presenting with no risk factors but with morbid ruminations; may be classified as minimal risk based on the severity of the depressive symptoms  PLAN OF CARE:Pt admitted to OBS to clear ED bed and arrange for residential treatment  I certify that inpatient services furnished can reasonably be expected to improve the  patient's condition.  Dara Hoyer 06/01/2014, 3:49 PM

## 2014-06-01 NOTE — BH Assessment (Signed)
[facility] Assessment Progress Note  Update:  Pt accepted by Darlyne Russian, [place] @ 1230 to [facility] OBS.  Shalita Forrest, AC, notified, and pt assigned to OBS Bed 4.  Pt's nurse, Anne Ng, notified @ [phone].  She will notify EDP [place] of acceptance to [facility].  TTS and ED staff updated.  Pt is voluntary and will be transported to [facility] via [place], [place], [facility] Licensed Professional Counselor Triage Specialist
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
## 2014-06-01 NOTE — ED Notes (Signed)
[facility]  CALLED  FOR  TRANSPORT   

## 2014-06-01 NOTE — ED Notes (Signed)
[facility] HAS LEFT WITH PT AND HER BELONGINGS AND VALUABLES AND TRANSPORTING TO [facility].

## 2014-06-02 DIAGNOSIS — F341 Dysthymic disorder: Secondary | ICD-10-CM

## 2014-06-02 DIAGNOSIS — F1010 Alcohol abuse, uncomplicated: Secondary | ICD-10-CM

## 2014-06-02 DIAGNOSIS — F1410 Cocaine abuse, uncomplicated: Secondary | ICD-10-CM

## 2014-06-02 MED ORDER — HYDROXYZINE HCL 25 MG PO TABS: 25.0000 mg | ORAL_TABLET | Freq: Four times a day (QID) | ORAL | Status: DC | PRN

## 2014-06-02 MED ORDER — FLUOXETINE HCL 40 MG PO CAPS: 40.0000 mg | ORAL_CAPSULE | Freq: Every day | ORAL | Status: DC

## 2014-06-02 MED ORDER — RISPERIDONE 1 MG PO TABS: 1.0000 mg | ORAL_TABLET | Freq: Every day | ORAL | Status: DC

## 2014-06-02 MED ORDER — ADULT MULTIVITAMIN W/MINERALS CH: 1.0000 | ORAL_TABLET | Freq: Every day | ORAL | Status: DC

## 2014-06-02 MED ORDER — TRAZODONE HCL 50 MG PO TABS: 50.0000 mg | ORAL_TABLET | Freq: Every evening | ORAL | Status: DC | PRN

## 2014-06-02 NOTE — BH Assessment (Signed)
[facility] Assessment Progress Note  At 10:42 Gerald Stabs from [facility] called; pt declined for admission to their facility due to her need for detoxification, which they do not provide.  Jalene Mullet, MA Triage Specialist 06/02/2014 @ 10:43

## 2014-06-02 NOTE — Progress Notes (Signed)
Patient ID: [name], female   DOB: 1956-04-17, 58 y.o.   MRN: [id number]   D: Patient woke up for morning medications. Still appears drowsy but alert. Denies any excessive withdrawal symptoms at present. Librium prn if having any when awake more today. Currently denies any SI/HI. No psychosis reported or noted by undersigned. Coughing this am when trying to rest. A: Staff will monitor on q 15 minute checks while in OBS unit with staff within view at all times. R: Obtained morning medications along with tessalon pearl medication for coughing.
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
## 2014-06-02 NOTE — Discharge Instructions (Addendum)
To help you maintain a sober lifestyle, you are scheduled for orientation in the Chemical Dependency Intensive Outpatient Program (CD-IOP) at [facility] on Tuesday 06/10/2014 at 2:00 pm.  You have been given intake paperwork.  Fill it out at your convenience, and be sure to take it with you to your orientation:       [facility] [place], [place] [address]      Contact person: Brandon Melnick, [place]      [phone]  In the meantime, you are strongly encouraged to attend Alcoholics Anonymous meetings.  Refer to the schedule provided by the Observation Unit.  They have also provided printed information about [facility] bus services, including applications and details about how to qualify.
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
## 2014-06-02 NOTE — Progress Notes (Signed)
Patient ID: [name], female   DOB: Dec 18, 1955, 58 y.o.   MRN: [id number] Discharge Note-Seen by Heloise Purpura NP and he determined that she could be discharged with follow up scheduled. [place] arrangements for her to follow up with IOP thru [facility] on Tues.the 18th. She also was given the schedule for AA meetings in this area as well as information re the SCAT bus that she requested. She denies any thoughts to hurt self or others. She is not psychotic. She states she was here because she got sick on her stomach.She called her sister to pick her up from here. All of her property was returned to her.

## 2014-06-02 NOTE — Plan of Care (Signed)
[place] Observation Crisis Plan  Reason for Crisis Plan:  Substance Abuse   Plan of Care:  Referral for IOP  Family Support:    Adult daughter, sister  Current Living Environment:  Living Arrangements: Alone  Insurance:  [facility] Account   Name Acct ID Class Status Primary Coverage   [name], [name] [id number] [place] - MEDICARE PART A AND B        Guarantor Account (for Hospital Account [id number])   Name Relation to Pt Service Area Active? Acct Type   [name], [name] Self [facility] Yes Behavioral Health   Address Phone       [address] [place],  [address] [phone])          Coverage Information (for Hospital Account [id number])   F/O Payor/Plan Precert #   MEDICARE/MEDICARE PART A AND B    Subscriber Subscriber #   [name], [name] [id number]   Address Phone   [address] [place], [place] [address]       Legal Guardian:   Self  Primary Care Provider:  [name], MD; pt no longer sees Dr Berdine Addison; she has a new provider that comes to her home.  Current Outpatient Providers:  [name]  Psychiatrist:   [name]  Counselor/Therapist:   Nurse, adult with Medications:  Yes; forgets to take them occasionally  Additional Information: After consulting with Katherine Pizza, NP it has been determined that pt does not present a life threatening danger to herself or others, and that psychiatric hospitalization is not indicated for her at this time.  However she would benefit from substance abuse rehabilitation treatment.  Pt is interested in a 28 day residential program, but is not willing to leave the [place] area.  She does not want to go to [facility], and [facility] is beyond her financial means.  Given that there are no other residential options available in this community she is willing to go to the Chemical Dependency Intensive Outpatient Program (CD-IOP).  Arrangements  have been made for her to start CD-IOP at [facility], with orientation on Tuesday, 06/10/2014 at 14:00.  This will appear in her discharge instructions.  Pt is also interested in SCAT bus services, and in Alcoholics Anonymous meetings.  She will be given handouts by Observation Unit staff with this information.  [name], [place] Triage Specialist [name] 8/10/20151:26 PM

## 2014-06-04 NOTE — Discharge Summary (Signed)
Patient stable, back to baseline and can be discharged

## 2014-07-05 ENCOUNTER — Emergency Department (HOSPITAL_COMMUNITY)
Admission: EM | Admit: 2014-07-05 | Discharge: 2014-07-05 | Disposition: A | Discharge status: Other Acute Inpatient Hospital | Payer: Medicare Other | Source: Home / Self Care | Attending: Emergency Medicine | Admitting: Emergency Medicine

## 2014-07-05 ENCOUNTER — Encounter (HOSPITAL_COMMUNITY): Payer: Self-pay | Admitting: Emergency Medicine

## 2014-07-05 ENCOUNTER — Encounter (HOSPITAL_COMMUNITY): Payer: Self-pay | Admitting: *Deleted

## 2014-07-05 ENCOUNTER — Inpatient Hospital Stay (HOSPITAL_COMMUNITY)
Admission: AD | Admit: 2014-07-05 | Discharge: 2014-07-14 | DRG: 897 | Disposition: A | Discharge status: Rehabilitation Facility | Payer: Medicare Other | Source: Intra-hospital | Attending: Psychiatry | Admitting: Psychiatry

## 2014-07-05 DIAGNOSIS — J45909 Unspecified asthma, uncomplicated: Secondary | ICD-10-CM

## 2014-07-05 DIAGNOSIS — Z79899 Other long term (current) drug therapy: Secondary | ICD-10-CM

## 2014-07-05 DIAGNOSIS — Z5987 Material hardship due to limited financial resources, not elsewhere classified: Secondary | ICD-10-CM

## 2014-07-05 DIAGNOSIS — F1410 Cocaine abuse, uncomplicated: Secondary | ICD-10-CM

## 2014-07-05 DIAGNOSIS — F1994 Other psychoactive substance use, unspecified with psychoactive substance-induced mood disorder: Secondary | ICD-10-CM | POA: Diagnosis present

## 2014-07-05 DIAGNOSIS — F17200 Nicotine dependence, unspecified, uncomplicated: Secondary | ICD-10-CM | POA: Insufficient documentation

## 2014-07-05 DIAGNOSIS — G8929 Other chronic pain: Secondary | ICD-10-CM

## 2014-07-05 DIAGNOSIS — F1420 Cocaine dependence, uncomplicated: Secondary | ICD-10-CM | POA: Insufficient documentation

## 2014-07-05 DIAGNOSIS — F10229 Alcohol dependence with intoxication, unspecified: Secondary | ICD-10-CM

## 2014-07-05 DIAGNOSIS — F3289 Other specified depressive episodes: Secondary | ICD-10-CM | POA: Insufficient documentation

## 2014-07-05 DIAGNOSIS — F411 Generalized anxiety disorder: Secondary | ICD-10-CM | POA: Diagnosis present

## 2014-07-05 DIAGNOSIS — G43909 Migraine, unspecified, not intractable, without status migrainosus: Secondary | ICD-10-CM

## 2014-07-05 DIAGNOSIS — F329 Major depressive disorder, single episode, unspecified: Secondary | ICD-10-CM | POA: Insufficient documentation

## 2014-07-05 DIAGNOSIS — R45851 Suicidal ideations: Secondary | ICD-10-CM

## 2014-07-05 DIAGNOSIS — G4700 Insomnia, unspecified: Secondary | ICD-10-CM | POA: Diagnosis present

## 2014-07-05 DIAGNOSIS — K029 Dental caries, unspecified: Secondary | ICD-10-CM

## 2014-07-05 DIAGNOSIS — M549 Dorsalgia, unspecified: Secondary | ICD-10-CM | POA: Diagnosis present

## 2014-07-05 DIAGNOSIS — F1220 Cannabis dependence, uncomplicated: Secondary | ICD-10-CM | POA: Diagnosis present

## 2014-07-05 DIAGNOSIS — F1024 Alcohol dependence with alcohol-induced mood disorder: Secondary | ICD-10-CM

## 2014-07-05 DIAGNOSIS — Z598 Other problems related to housing and economic circumstances: Secondary | ICD-10-CM

## 2014-07-05 DIAGNOSIS — F1020 Alcohol dependence, uncomplicated: Secondary | ICD-10-CM | POA: Diagnosis not present

## 2014-07-05 DIAGNOSIS — Z609 Problem related to social environment, unspecified: Secondary | ICD-10-CM

## 2014-07-05 DIAGNOSIS — IMO0001 Reserved for inherently not codable concepts without codable children: Secondary | ICD-10-CM | POA: Diagnosis present

## 2014-07-05 DIAGNOSIS — F1010 Alcohol abuse, uncomplicated: Secondary | ICD-10-CM

## 2014-07-05 LAB — COMPREHENSIVE METABOLIC PANEL
ALK PHOS: 104 U/L (ref 39–117)
ALT: 26 U/L (ref 0–35)
AST: 33 U/L (ref 0–37)
Albumin: 3.9 g/dL (ref 3.5–5.2)
Anion gap: 19 — ABNORMAL HIGH (ref 5–15)
BILIRUBIN TOTAL: 0.7 mg/dL (ref 0.3–1.2)
BUN: 6 mg/dL (ref 6–23)
CHLORIDE: 96 meq/L (ref 96–112)
CO2: 23 mEq/L (ref 19–32)
Calcium: 9.2 mg/dL (ref 8.4–10.5)
Creatinine, Ser: 0.83 mg/dL (ref 0.50–1.10)
GFR calc Af Amer: 88 mL/min — ABNORMAL LOW (ref >90)
GFR calc non Af Amer: 76 mL/min — ABNORMAL LOW (ref >90)
Glucose, Bld: 95 mg/dL (ref 70–99)
POTASSIUM: 3.1 meq/L — AB (ref 3.7–5.3)
Sodium: 138 mEq/L (ref 137–147)
TOTAL PROTEIN: 6.8 g/dL (ref 6.0–8.3)

## 2014-07-05 LAB — CBC
HEMATOCRIT: 42.8 % (ref 36.0–46.0)
Hemoglobin: 15 g/dL (ref 12.0–15.0)
MCH: 29.8 pg (ref 26.0–34.0)
MCHC: 35 g/dL (ref 30.0–36.0)
MCV: 85.1 fL (ref 78.0–100.0)
Platelets: 216 10*3/uL (ref 150–400)
RBC: 5.03 MIL/uL (ref 3.87–5.11)
RDW: 13.2 % (ref 11.5–15.5)
WBC: 6.1 10*3/uL (ref 4.0–10.5)

## 2014-07-05 LAB — SALICYLATE LEVEL: Salicylate Lvl: <2 mg/dL — ABNORMAL LOW (ref 2.8–20.0)

## 2014-07-05 LAB — RAPID URINE DRUG SCREEN, HOSP PERFORMED
AMPHETAMINES: NOT DETECTED
Barbiturates: NOT DETECTED
Benzodiazepines: NOT DETECTED
Cocaine: POSITIVE — AB
OPIATES: NOT DETECTED
Tetrahydrocannabinol: NOT DETECTED

## 2014-07-05 LAB — ETHANOL: Alcohol, Ethyl (B): 72 mg/dL — ABNORMAL HIGH (ref 0–11)

## 2014-07-05 LAB — ACETAMINOPHEN LEVEL: Acetaminophen (Tylenol), Serum: <15 ug/mL (ref 10–30)

## 2014-07-05 MED ORDER — LORAZEPAM 1 MG PO TABS: 0.0000 mg | ORAL_TABLET | Freq: Two times a day (BID) | ORAL | Status: DC

## 2014-07-05 MED ORDER — NICOTINE 21 MG/24HR TD PT24
21.0000 mg | MEDICATED_PATCH | Freq: Every day | TRANSDERMAL | Status: DC
  Administered 2014-07-06 – 2014-07-14 (×8): 21 mg via TRANSDERMAL
  Filled 2014-07-05 (×12): qty 1

## 2014-07-05 MED ORDER — ADULT MULTIVITAMIN W/MINERALS CH
1.0000 | ORAL_TABLET | Freq: Every day | ORAL | Status: DC
  Administered 2014-07-06 – 2014-07-14 (×9): 1 via ORAL
  Filled 2014-07-05 (×9): qty 1
  Filled 2014-07-05: qty 14
  Filled 2014-07-05 (×3): qty 1

## 2014-07-05 MED ORDER — TRAZODONE HCL 50 MG PO TABS
50.0000 mg | ORAL_TABLET | Freq: Every evening | ORAL | Status: DC | PRN
  Administered 2014-07-05 – 2014-07-09 (×2): 50 mg via ORAL
  Filled 2014-07-05 (×2): qty 1

## 2014-07-05 MED ORDER — FLUOXETINE HCL 20 MG PO CAPS
40.0000 mg | ORAL_CAPSULE | Freq: Every day | ORAL | Status: DC
  Administered 2014-07-06 – 2014-07-08 (×3): 40 mg via ORAL
  Filled 2014-07-05 (×8): qty 2

## 2014-07-05 MED ORDER — RISPERIDONE 1 MG PO TABS
1.0000 mg | ORAL_TABLET | Freq: Every day | ORAL | Status: DC
  Administered 2014-07-05 – 2014-07-13 (×8): 1 mg via ORAL
  Filled 2014-07-05 (×2): qty 1
  Filled 2014-07-05: qty 14
  Filled 2014-07-05 (×11): qty 1

## 2014-07-05 MED ORDER — ALUM & MAG HYDROXIDE-SIMETH 200-200-20 MG/5ML PO SUSP
30.0000 mL | ORAL | Status: DC | PRN
Start: 2014-07-05 — End: 2014-07-05
  Filled 2014-07-05: qty 30

## 2014-07-05 MED ORDER — VITAMIN B-1 100 MG PO TABS
100.0000 mg | ORAL_TABLET | Freq: Every day | ORAL | Status: DC
  Administered 2014-07-06 – 2014-07-14 (×9): 100 mg via ORAL
  Filled 2014-07-05 (×13): qty 1

## 2014-07-05 MED ORDER — FLUOXETINE HCL 20 MG PO CAPS
40.0000 mg | ORAL_CAPSULE | Freq: Every day | ORAL | Status: DC
Start: 2014-07-05 — End: 2014-07-05
  Administered 2014-07-05: 40 mg via ORAL
  Filled 2014-07-05: qty 2

## 2014-07-05 MED ORDER — POTASSIUM CHLORIDE CRYS ER 20 MEQ PO TBCR: 40.0000 meq | EXTENDED_RELEASE_TABLET | Freq: Once | ORAL | Status: DC

## 2014-07-05 MED ORDER — MAGNESIUM HYDROXIDE 400 MG/5ML PO SUSP
30.0000 mL | Freq: Every day | ORAL | Status: DC | PRN
  Administered 2014-07-09: 30 mL via ORAL

## 2014-07-05 MED ORDER — ADULT MULTIVITAMIN W/MINERALS CH
1.0000 | ORAL_TABLET | Freq: Every day | ORAL | Status: DC
  Administered 2014-07-05: 1 via ORAL
  Filled 2014-07-05: qty 1

## 2014-07-05 MED ORDER — LORAZEPAM 1 MG PO TABS
0.0000 mg | ORAL_TABLET | Freq: Four times a day (QID) | ORAL | Status: DC
  Administered 2014-07-05: 1 mg via ORAL
  Filled 2014-07-05: qty 1

## 2014-07-05 MED ORDER — FLUOXETINE HCL 40 MG PO CAPS
40.0000 mg | ORAL_CAPSULE | Freq: Every day | ORAL | Status: DC
Start: 2014-07-05 — End: 2014-07-05

## 2014-07-05 MED ORDER — POTASSIUM CHLORIDE CRYS ER 20 MEQ PO TBCR
20.0000 meq | EXTENDED_RELEASE_TABLET | Freq: Once | ORAL | Status: AC
  Administered 2014-07-05: 20 meq via ORAL
  Filled 2014-07-05: qty 1

## 2014-07-05 MED ORDER — ONDANSETRON 4 MG PO TBDP: 4.0000 mg | ORAL_TABLET | Freq: Four times a day (QID) | ORAL | Status: AC | PRN

## 2014-07-05 MED ORDER — RISPERIDONE 0.5 MG PO TABS
1.0000 mg | ORAL_TABLET | Freq: Every day | ORAL | Status: DC
Start: 2014-07-05 — End: 2014-07-05
  Filled 2014-07-05: qty 1

## 2014-07-05 MED ORDER — HYDROXYZINE HCL 25 MG PO TABS: 25.0000 mg | ORAL_TABLET | Freq: Four times a day (QID) | ORAL | Status: DC | PRN

## 2014-07-05 MED ORDER — HYDROXYZINE HCL 50 MG PO TABS
25.0000 mg | ORAL_TABLET | Freq: Four times a day (QID) | ORAL | Status: DC | PRN
  Administered 2014-07-06 – 2014-07-07 (×2): 25 mg via ORAL
  Filled 2014-07-05 (×2): qty 1
  Filled 2014-07-05: qty 20

## 2014-07-05 MED ORDER — POTASSIUM CHLORIDE CRYS ER 20 MEQ PO TBCR
20.0000 meq | EXTENDED_RELEASE_TABLET | Freq: Once | ORAL | Status: AC
  Administered 2014-07-05: 20 meq via ORAL
  Filled 2014-07-05 (×2): qty 1

## 2014-07-05 MED ORDER — TRAZODONE HCL 50 MG PO TABS
50.0000 mg | ORAL_TABLET | Freq: Every evening | ORAL | Status: DC | PRN
  Filled 2014-07-05: qty 1

## 2014-07-05 MED ORDER — CHLORDIAZEPOXIDE HCL 25 MG PO CAPS
25.0000 mg | ORAL_CAPSULE | Freq: Four times a day (QID) | ORAL | Status: AC | PRN
Start: 2014-07-05 — End: 2014-07-08
  Administered 2014-07-05 – 2014-07-07 (×6): 25 mg via ORAL
  Filled 2014-07-05 (×6): qty 1

## 2014-07-05 MED ORDER — ACETAMINOPHEN 325 MG PO TABS
650.0000 mg | ORAL_TABLET | Freq: Four times a day (QID) | ORAL | Status: DC | PRN
  Administered 2014-07-07 – 2014-07-09 (×2): 650 mg via ORAL
  Filled 2014-07-05 (×2): qty 2

## 2014-07-05 MED ORDER — ONDANSETRON HCL 4 MG PO TABS
4.0000 mg | ORAL_TABLET | Freq: Three times a day (TID) | ORAL | Status: DC | PRN
  Filled 2014-07-05: qty 1

## 2014-07-05 MED ORDER — ALUM & MAG HYDROXIDE-SIMETH 200-200-20 MG/5ML PO SUSP: 30.0000 mL | ORAL | Status: DC | PRN

## 2014-07-05 MED ORDER — LOPERAMIDE HCL 2 MG PO CAPS
2.0000 mg | ORAL_CAPSULE | ORAL | Status: AC | PRN
  Administered 2014-07-05: 4 mg via ORAL
  Filled 2014-07-05: qty 2

## 2014-07-05 NOTE — ED Notes (Signed)
Pt. Stated, Im depressed and I dont know what I will do to myself.  I here to get help from alcohol and drugs. Last alcohol was this morning liquor and beer.  Last drug intake was last night crack and cocaine.

## 2014-07-05 NOTE — ED Notes (Signed)
Report given to Cecille Rubin, Therapist, sports.  Move to C20

## 2014-07-05 NOTE — ED Notes (Signed)
Called for [facility] for transportation at this time, Wells Guiles states it will be about 20-30 minutes

## 2014-07-05 NOTE — ED Provider Notes (Signed)
  Physical Exam  BP 157/93  Pulse 89  Temp(Src) 98.3 F (36.8 C) (Oral)  Resp 13  Ht 5\' 7"  (1.702 m)  Wt 151 lb (68.493 kg)  BMI 23.64 kg/m2  SpO2 95%  Physical Exam  ED Course  Procedures  MDM TTs has recommended inpatient treatment      Ovid Curd R. Alvino Chapel, MD 07/05/14 1721

## 2014-07-05 NOTE — BH Assessment (Signed)
Assessment Note  [name] is an 58 y.o. female who presents voluntarily to [facility] requesting alcohol detox. Patient reports she has been drinking alcohol and using cocaine daily for several years. Patient reported that last use of both was earlier today. Patient reported receiving SA treatment tx times in the past at [facility] and [facility] with most recent admission in 2014. Patient denies other drug use. Patient reported experiencing shakiness and chills. Patient reports hx of blackouts but no hx of seizures.   Patient denies current SI and HI. Patient stated she has history of depression and a past suicide attempt several years ago. Patient reported feeling depressed and "I don't know why." Patient endorses tactile hallucinations and reported experiencing them when she uses substances. Patient denies AVH.  Patient reported difficulty sleeping and eating. Patient states she lives alone and receives disability. Patient identified brother and daughter as supports. Patient reported being prescribed Prozac, Trazodone and Risperdal for over 10 years. Patient currently receiving outpatient therapy and medication management from [facility]. Patient stated she has been out of her medication for 3 days and she has follow up appointment on 9/14.    Axis I: Major Depression, recurrent, moderate and Alcohol Use Disorder, moderate and Cocaine Use Disorder, moderate  Past Medical History:  Past Medical History  Diagnosis Date  . Depression   . Migraine headache   . Chronic back pain   . Alcohol dependence 06/11/2013  . Asthma   . Cocaine abuse     Past Surgical History  Procedure Laterality Date  . No past surgeries      Family History: No family history on file.  Social History:  reports that she has been smoking Cigarettes.  She has a 35 pack-year smoking history. She has never used smokeless tobacco. She reports that she drinks alcohol. She reports that she uses illicit drugs ("Crack" cocaine and  Cocaine).  Additional Social History:  Alcohol / Drug Use Prescriptions: Prozac, Trazodone, Risperdal History of alcohol / drug use?: Yes Withdrawal Symptoms: Tremors;Fever / Chills Substance #1 Name of Substance 1: Alcohol 1 - Age of First Use: unk 1 - Amount (size/oz): 12 pack beer 1 - Frequency: daily 1 - Duration: several years 1 - Last Use / Amount: 07/05/14/ 2 beers Substance #2 Name of Substance 2: cocaine 2 - Age of First Use: unk 2 - Amount (size/oz): unk 2 - Frequency: daily 2 - Duration: several years 2 - Last Use / Amount: 07/05/14  CIWA: CIWA-Ar BP: 161/98 mmHg Pulse Rate: 83 COWS:    Allergies: No Known Allergies  Home Medications:  (Not in a hospital admission)  OB/GYN Status:  No LMP recorded. Patient is postmenopausal.  General Assessment Data Location of Assessment: [facility] ED ACT Assessment: Yes Is this a Tele or Face-to-Face Assessment?: Tele Assessment Is this an Initial Assessment or a Re-assessment for this encounter?: Initial Assessment Living Arrangements: Alone Can pt return to current living arrangement?: Yes Admission Status: Voluntary Is patient capable of signing voluntary admission?: Yes Transfer from: [facility] Referral Source: Self/Family/Friend  Medical Screening Exam ([facility]) Medical Exam completed:  (NA)  [place] Living Arrangements: Alone Name of Psychiatrist: Beverly Sessions Name of Therapist: [facility]     Risk to self with the past 6 months Suicidal Ideation: No-Not Currently/Within Last 6 Months Suicidal Intent: No Is patient at risk for suicide?: No Suicidal Plan?: No Access to Means: No What has been your use of drugs/alcohol within the last 12 months?:  (etoh  and cocaine use) Previous Attempts/Gestures: Yes How many times?: 1 Other Self Harm Risks: none reported Triggers for Past Attempts: Unknown Intentional Self Injurious Behavior: None Family Suicide History: No Recent stressful life  event(s):  (unknown) Persecutory voices/beliefs?: No Depression: Yes Depression Symptoms: Despondent;Insomnia;Isolating;Feeling worthless/self pity Substance abuse history and/or treatment for substance abuse?: Yes Suicide prevention information given to non-admitted patients: Not applicable  Risk to Others within the past 6 months Homicidal Ideation: No Thoughts of Harm to Others: No Current Homicidal Intent: No Current Homicidal Plan: No Access to Homicidal Means: No Identified Victim:  (NA) History of harm to others?: No Assessment of Violence: None Noted Violent Behavior Description:  (Patient calm and cooperative at assessment. ) Does patient have access to weapons?: No Criminal Charges Pending?: No Describe Pending Criminal Charges:  (NA) Does patient have a court date: No Court Date:  (NA)  Psychosis Hallucinations: Tactile Delusions: None noted  Mental Status Report Appear/Hygiene: Disheveled;In scrubs Eye Contact: Good Motor Activity: Unremarkable Speech: Logical/coherent;Soft Level of Consciousness: Alert Mood: Depressed Affect: Flat Anxiety Level: Minimal Thought Processes: Coherent;Relevant Judgement: Unimpaired Orientation: Place;Person;Time;Situation Obsessive Compulsive Thoughts/Behaviors: None  Cognitive Functioning Concentration: Normal Memory: Recent Intact;Remote Intact IQ: Average Insight: Poor Impulse Control: Poor Appetite: Poor Weight Loss:  (unk) Weight Gain:  (unk) Sleep: Decreased Total Hours of Sleep:  (unk) Vegetative Symptoms: None  ADLScreening [facility] Assessment Services) Patient's cognitive ability adequate to safely complete daily activities?: Yes Patient able to express need for assistance with ADLs?: Yes Independently performs ADLs?: Yes (appropriate for developmental age)  Prior Inpatient Therapy Prior Inpatient Therapy: Yes Prior Therapy Dates: 2x -05/2013 and years ago Prior Therapy Facilty/Provider(s): [facility]  [place] Reason  for Treatment: SA, depression  Prior Outpatient Therapy Prior Outpatient Therapy: Yes Prior Therapy Dates: current Prior Therapy Facilty/Provider(s): [facility] Reason for Treatment: medication management  ADL Screening (condition at time of admission) Patient's cognitive ability adequate to safely complete daily activities?: Yes Is the patient deaf or have difficulty hearing?: No Does the patient have difficulty seeing, even when wearing glasses/contacts?: No Does the patient have difficulty concentrating, remembering, or making decisions?: No Patient able to express need for assistance with ADLs?: Yes Does the patient have difficulty dressing or bathing?: No Independently performs ADLs?: Yes (appropriate for developmental age)    Abuse/Neglect Assessment (Assessment to be complete while patient is alone) Physical Abuse: Denies Verbal Abuse: Denies Sexual Abuse: Denies Exploitation of patient/patient's resources: Denies Self-Neglect: Denies     Regulatory affairs officer (For Healthcare) Does patient have an advance directive?: No Would patient like information on creating an advanced directive?: No - patient declined information    Additional Information 1:1 In Past 12 Months?: No CIRT Risk: No Elopement Risk: No Does patient have medical clearance?: Yes  Disposition: Clinician consulted with Mertha Finders, NP who states Pt meets criteria to observation unit. Randall Hiss, [facility] confirmed bed availability. Pt assigned to bed 6 to Charmaine Downs, NP.   Disposition Initial Assessment Completed for this Encounter: Yes Disposition of Patient: Inpatient treatment program Type of inpatient treatment program: Adult  On Site Evaluation by:   Reviewed with Physician:    Essie Christine 07/05/2014 4:47 PM

## 2014-07-05 NOTE — ED Notes (Signed)
Pt. Stated, I feel like I want to sream out and I feel weak all over.

## 2014-07-05 NOTE — ED Notes (Signed)
Staffing office called for a sitter, Security for wanding,  Pt. Changed clothes and bagged in a personal belonging bag.  Brother with pt.

## 2014-07-05 NOTE — H&P (Signed)
Admission TO OBS 07/05/14 Chief Complaint   Patient presents with   .  Suicidal   .  Depression   .  Alcohol Problem   .  Drug Problem      (Consider location/radiation/quality/duration/timing/severity/associated sxs/prior Treatment) HPI Comments: Pt states that she is depressed and thinks she may hurt herself if she doesn't stop using cocaine and etoh. Used both last this morning. States that she has tried cutting herself in the past. She states that she last went thru detox a couple of months ago. Pt states that she doesn't have plan to hurt herself. Hasn't taken her psych medications in the last 3 days. States that she feels week but doesn't have any pain      Past Medical History   Diagnosis  Date   .  Depression     .  Migraine headache     .  Chronic back pain     .  Alcohol dependence  06/11/2013   .  Asthma     .  Cocaine abuse        Past Surgical History   Procedure  Laterality  Date   .  No past surgeries        No family history on file. History   Substance Use Topics   .  Smoking status:  Current Every Day Smoker -- 1.00 packs/day for 35 years       Types:  Cigarettes   .  Smokeless tobacco:  Never Used   .  Alcohol Use:  0.0 oz/week         Comment: fifth liquor daily - At [facility] now to stop drinking      OB History     Grav  Para  Term  Preterm  Abortions  TAB  SAB  Ect  Mult  Living                              Review of Systems  Constitutional: Negative.   Respiratory: Negative.   Cardiovascular: Negative.   All other systems reviewed and are negative.       Allergies   Review of patient's allergies indicates no known allergies.    Home Medications      Prior to Admission medications    Medication  Sig  Start Date  End Date  Taking?  Authorizing Provider   FLUoxetine (PROZAC) 40 MG capsule  Take 1 capsule (40 mg total) by mouth daily. For depression  06/02/14      Benjamine Mola, FNP   hydrOXYzine (ATARAX/VISTARIL) 25 MG tablet  Take 1 tablet  (25 mg total) by mouth every 6 (six) hours as needed for anxiety.  06/02/14      Benjamine Mola, FNP   Multiple Vitamin (MULTIVITAMIN WITH MINERALS) TABS tablet  Take 1 tablet by mouth daily.  06/02/14      Benjamine Mola, FNP   risperiDONE (RISPERDAL) 1 MG tablet  Take 1 tablet (1 mg total) by mouth at bedtime. For mood control  06/02/14      Benjamine Mola, FNP   traZODone (DESYREL) 50 MG tablet  Take 1 tablet (50 mg total) by mouth at bedtime as needed for sleep. For sleep  06/02/14      Benjamine Mola, FNP    BP 159/109  Pulse 85  Temp(Src) 98.3 F (36.8 C) (Oral)  Resp 17  Ht 5\' 7"  (1.702 m)  Wt 151 lb (68.493 kg)  BMI 23.64 kg/m2  SpO2 97% Physical Exam  Nursing note and vitals reviewed. Constitutional: She is oriented to person, place, and time. She appears well-developed and well-nourished.  HENT:  Right Ear: External ear normal.  Left Ear: External ear normal.  Decayed teeth  Cardiovascular: Normal rate.   Pulmonary/Chest: Effort normal and breath sounds normal.  Abdominal: Bowel sounds are normal.  Musculoskeletal: Normal range of motion.  Neurological: She is alert and oriented to person, place, and time. Coordination normal.  Skin: Skin is warm and dry.  Psychiatric:  Elements:  Location:  [facility] OBS UNIT. Quality:  Admission H&P. Severity:  Problem is severe. Timing:  Chronic since  8 encounters  from 2003-2008 for alcohol. Now presents with opiate and cocaine on UDS and negative ETOH level. Associated Signs/Synptoms: Depression Symptoms:  hypersomnia, fatigue, feelings of worthlessness/guilt, (Hypo) Manic Symptoms:  NA Anxiety Symptoms:  Excessive Worry, Panic Symptoms,Substance induced Psychotic Symptoms:  NA PTSD Symptoms:NA  Psychiatric Specialty Exam: Physical Exam  Vitals reviewed. Constitutional: She is oriented to person, place, and time.  Wan,tired,older than stated age appearing BF  HENT:   Head: Normocephalic and atraumatic.  Right Ear: External ear  normal.  Left Ear: External ear normal.   Nose: Nose normal.  Eyes: Conjunctivae and EOM are normal. Pupils are equal, round, and reactive to light. Right eye exhibits no discharge. Left eye exhibits no discharge.  Neck: Normal range of motion. Neck supple. No JVD present. No tracheal deviation present. No thyromegaly present.  Cardiovascular: Normal rate and regular rhythm.   Respiratory: Effort normal. No stridor.  Distant Breath Sounds  GI:  Deferred  Genitourinary:  Deferred  Musculoskeletal: Normal range of motion. She exhibits no edema and no tenderness.  Lymphadenopathy:    She has no cervical adenopathy.  Neurological: She is alert and oriented to person, place, and time. No cranial nerve deficit. She exhibits normal muscle tone. Coordination normal.  Skin: Skin is warm and dry.         Suicide Risk Assessment  Admission Assessment     Nursing information obtained from:  Chart notes Demographic factors:  disabled Current Mental Status:  Stable withdrawal.Ready for residential; treatment Loss Factors:  Self esteem/ Historical Factors: chronic Alcoholism /Crack cocaine dependency/SIMD  Risk Reduction Factors:  Family support/desire for improvement Total Time spent with patient: 20 minutes  CLINICAL FACTORS:    Alcohol/Substance Abuse/Dependencies Chronic Pain Previous Psychiatric Diagnoses and Treatments Medical Diagnoses and Treatments/Surgeries  Psychiatric Specialty Exam:       Blood pressure 146/86, pulse 69.There is no weight on file to calculate BMI.   General Appearance: Disheveled   Eye Contact::  Poor   Speech:  Slow and Difficult to understand at times   Volume:  Decreased   Mood:  Dysphoric   Affect:  Congruent   Thought Process:  Intact   Orientation:  Full (Time, Place, and Person)   Thought Content:  WDL for condition (Addict)   Suicidal Thoughts:  No   Homicidal Thoughts:  No   Memory:  Negative   Judgement:  Impaired   Insight:  Lacking    Psychomotor Activity:  Decreased   Concentration:  Poor   Recall:  [facility] Knowledge:Fair   Language: Fair   Akathisia:  NA   Handed:  Right   AIMS (if indicated):  NA   Assets:  Desire for Improvement  Financial Resources/Insurance   Sleep:  Drug disturbed  Musculoskeletal: Strength & Muscle Tone: decreased Gait & Station: normal Patient leans: N/A  COGNITIVE FEATURES THAT CONTRIBUTE TO RISK:   Loss of executive function    SUICIDE RISK:    Minimal: No identifiable suicidal ideation.  Patients presenting with no risk factors but with morbid ruminations; may be classified as minimal risk based on the severity of the depressive symptoms  PLAN OF CARE:Pt admitted to OBS to clear ED bed and arrange for residential treatment  I certify that inpatient services furnished can reasonably be expected to improve the patient's condition.  Dara Hoyer 06/01/2014, 3:49 PM

## 2014-07-05 NOTE — ED Notes (Signed)
Staffing office called for sitter. 

## 2014-07-05 NOTE — Plan of Care (Signed)
[facility] Observation Crisis Plan  Reason for Crisis Plan:  Substance Abuse   Plan of Care:  Referral for Substance Abuse  Family Support:    [name] (brother)  Current Living Environment:  Living Arrangements: Alone  Insurance:   Hospital Account   Name Acct ID Class Status Primary Coverage   [name], [name] [id number] [place] - MEDICARE PART A AND B        Guarantor Account (for Hospital Account [id number])   Name Relation to Pt Service Area Active? Acct Type   [name] Self [facility] Yes Behavioral Health   Address Phone       [place], [place] [id number] [phone])          Coverage Information (for Hospital Account [id number])   1. [place] PART A AND B   F/O Payor/Plan Precert #   MEDICARE/MEDICARE PART A AND B    Subscriber Subscriber #   [name] [id number]   Address Phone   [address] [place], [place] [address]        2. [place]   F/O Payor/Plan Precert #   [facility] MEDICAID/[place]    Subscriber Subscriber #   [name] [id number]   Address Phone   [address], [place] [address] [phone]          Legal Guardian:     Primary Care Provider:  [name], MD  Current Outpatient Providers:  [name]  Psychiatrist:     Counselor/Therapist:     Compliant with Medications:  Yes  Additional Information:   [name] 9/12/20159:55 PM

## 2014-07-05 NOTE — ED Notes (Signed)
TTD done.

## 2014-07-05 NOTE — BH Assessment (Signed)
Clinician consulted with Mertha Finders, NP who states Pt meets criteria to observation unit. Randall Hiss, [facility] confirmed bed availability and requested patient to be given potassium before transport. Pt assigned to bed 6 to Charmaine Downs, NP.    Clinician provided updates to Dahlia Bailiff, PA and Cecille Rubin, Therapist, sports. Clinician faxed voluntary paperwork for patient to sign and be faxed back prior to arrival to [facility].   Cecille Rubin, RN also reported patient's dinner was just ordered and they will allow patient to eat prior to transport.   Rigoberto Noel, MSW, LCSW Triage Specialist [phone]

## 2014-07-05 NOTE — Progress Notes (Signed)
Pt is a 58 year old female admitted to the OBS unit voluntarily.  Pt reports she is here "because of alcohol and drugs."  Pt reports drinking daily and using "crack cocaine daily if I can get it."  Pt is guarded, depressed, and provides [place] information during assessmen.Pt was initially tearful.  Pt denies SI, HI, auditory hallucinations and visual hallucinations.  Pt is tremulous and reports "I just want to try to eat something and go to bed."  Pt reports she has previously attempted suicide but did not wish to elaborate.  Pt oriented to OBS unit, provided with meal and beverage, belongings searched, non-invasive body assessment completed.  Pt is in no acute distress at this time.  Will continue to monitor and assess for safety.

## 2014-07-05 NOTE — ED Notes (Signed)
Brother Konrad Dolores [phone], ok to give information to him.

## 2014-07-05 NOTE — Progress Notes (Signed)
[facility] INPATIENT:  Family/Significant Other Suicide Prevention Education  Suicide Prevention Education:  Patient Refusal for Family/Significant Other Suicide Prevention Education: The patient [name] has refused to provide written consent for family/significant other to be provided Family/Significant Other Suicide Prevention Education during admission and/or prior to discharge.    [name] 07/05/2014, 9:54 PM

## 2014-07-05 NOTE — ED Provider Notes (Signed)
CSN: [id number]     Arrival date & time 07/05/14  1350 History   First MD Initiated Contact with Patient 07/05/14 1446     Chief Complaint  Patient presents with  . Suicidal  . Depression  . Alcohol Problem  . Drug Problem     (Consider location/radiation/quality/duration/timing/severity/associated sxs/prior Treatment) HPI Comments: Pt states that she is depressed and thinks she may hurt herself if she doesn't stop using cocaine and etoh. Used both last this morning. States that she has tried cutting herself in the past. She states that she last went thru detox a couple of months ago. Pt states that she doesn't have plan to hurt herself. Hasn't taken her psych medications in the last 3 days. States that she feels week but doesn't have any pain anywher  The history is provided by the patient. No language interpreter was used.    Past Medical History  Diagnosis Date  . Depression   . Migraine headache   . Chronic back pain   . Alcohol dependence 06/11/2013  . Asthma   . Cocaine abuse    Past Surgical History  Procedure Laterality Date  . No past surgeries     No family history on file. History  Substance Use Topics  . Smoking status: Current Every Day Smoker -- 1.00 packs/day for 35 years    Types: Cigarettes  . Smokeless tobacco: Never Used  . Alcohol Use: 0.0 oz/week     Comment: fifth liquor daily - At [facility] now to stop drinking   OB History   Grav Para Term Preterm Abortions TAB SAB Ect Mult Living                 Review of Systems  Constitutional: Negative.   Respiratory: Negative.   Cardiovascular: Negative.   All other systems reviewed and are negative.     Allergies  Review of patient's allergies indicates no known allergies.  Home Medications   Prior to Admission medications   Medication Sig Start Date End Date Taking? Authorizing Provider  FLUoxetine (PROZAC) 40 MG capsule Take 1 capsule (40 mg total) by mouth daily. For depression 06/02/14   Benjamine Mola, FNP  hydrOXYzine (ATARAX/VISTARIL) 25 MG tablet Take 1 tablet (25 mg total) by mouth every 6 (six) hours as needed for anxiety. 06/02/14   Benjamine Mola, FNP  Multiple Vitamin (MULTIVITAMIN WITH MINERALS) TABS tablet Take 1 tablet by mouth daily. 06/02/14   Benjamine Mola, FNP  risperiDONE (RISPERDAL) 1 MG tablet Take 1 tablet (1 mg total) by mouth at bedtime. For mood control 06/02/14   Benjamine Mola, FNP  traZODone (DESYREL) 50 MG tablet Take 1 tablet (50 mg total) by mouth at bedtime as needed for sleep. For sleep 06/02/14   Benjamine Mola, FNP   BP 159/109  Pulse 85  Temp(Src) 98.3 F (36.8 C) (Oral)  Resp 17  Ht 5\' 7"  (1.702 m)  Wt 151 lb (68.493 kg)  BMI 23.64 kg/m2  SpO2 97% Physical Exam  Nursing note and vitals reviewed. Constitutional: She is oriented to person, place, and time. She appears well-developed and well-nourished.  HENT:  Right Ear: External ear normal.  Left Ear: External ear normal.  Decayed teeth  Cardiovascular: Normal rate.   Pulmonary/Chest: Effort normal and breath sounds normal.  Abdominal: Bowel sounds are normal.  Musculoskeletal: Normal range of motion.  Neurological: She is alert and oriented to person, place, and time. Coordination normal.  Skin: Skin is warm  and dry.  Psychiatric: She has a normal mood and affect.    ED Course  Procedures (including critical care time) Labs Review Labs Reviewed  CBC  COMPREHENSIVE METABOLIC PANEL  ETHANOL  ACETAMINOPHEN LEVEL  SALICYLATE LEVEL  URINE RAPID DRUG SCREEN (HOSP PERFORMED)    Imaging Review No results found.   EKG Interpretation None      MDM   Final diagnoses:  None   3:24 PM  Will contact tts  Pt left with Dr. Alvino Chapel to help with placement  Glendell Docker, NP 07/05/14 1653

## 2014-07-06 DIAGNOSIS — Z598 Other problems related to housing and economic circumstances: Secondary | ICD-10-CM | POA: Diagnosis not present

## 2014-07-06 DIAGNOSIS — Z5989 Other problems related to housing and economic circumstances: Secondary | ICD-10-CM | POA: Diagnosis not present

## 2014-07-06 DIAGNOSIS — F1994 Other psychoactive substance use, unspecified with psychoactive substance-induced mood disorder: Secondary | ICD-10-CM | POA: Diagnosis present

## 2014-07-06 DIAGNOSIS — Z609 Problem related to social environment, unspecified: Secondary | ICD-10-CM | POA: Diagnosis not present

## 2014-07-06 DIAGNOSIS — F1410 Cocaine abuse, uncomplicated: Secondary | ICD-10-CM

## 2014-07-06 DIAGNOSIS — G8929 Other chronic pain: Secondary | ICD-10-CM | POA: Diagnosis present

## 2014-07-06 DIAGNOSIS — F329 Major depressive disorder, single episode, unspecified: Secondary | ICD-10-CM | POA: Diagnosis present

## 2014-07-06 DIAGNOSIS — F1220 Cannabis dependence, uncomplicated: Secondary | ICD-10-CM | POA: Diagnosis present

## 2014-07-06 DIAGNOSIS — F17200 Nicotine dependence, unspecified, uncomplicated: Secondary | ICD-10-CM | POA: Diagnosis present

## 2014-07-06 DIAGNOSIS — F1020 Alcohol dependence, uncomplicated: Secondary | ICD-10-CM | POA: Diagnosis present

## 2014-07-06 DIAGNOSIS — M549 Dorsalgia, unspecified: Secondary | ICD-10-CM | POA: Diagnosis present

## 2014-07-06 DIAGNOSIS — F1420 Cocaine dependence, uncomplicated: Secondary | ICD-10-CM | POA: Diagnosis present

## 2014-07-06 DIAGNOSIS — F411 Generalized anxiety disorder: Secondary | ICD-10-CM | POA: Diagnosis present

## 2014-07-06 DIAGNOSIS — J45909 Unspecified asthma, uncomplicated: Secondary | ICD-10-CM | POA: Diagnosis present

## 2014-07-06 DIAGNOSIS — G4700 Insomnia, unspecified: Secondary | ICD-10-CM | POA: Diagnosis present

## 2014-07-06 DIAGNOSIS — Z5987 Material hardship: Secondary | ICD-10-CM | POA: Diagnosis not present

## 2014-07-06 DIAGNOSIS — IMO0001 Reserved for inherently not codable concepts without codable children: Secondary | ICD-10-CM | POA: Diagnosis present

## 2014-07-06 MED ORDER — THIAMINE HCL 100 MG/ML IJ SOLN: 100.0000 mg | Freq: Once | INTRAMUSCULAR | Status: DC

## 2014-07-06 MED ORDER — CHLORDIAZEPOXIDE HCL 25 MG PO CAPS
25.0000 mg | ORAL_CAPSULE | Freq: Four times a day (QID) | ORAL | Status: DC | PRN
Start: 2014-07-06 — End: 2014-07-06

## 2014-07-06 MED ORDER — LOPERAMIDE HCL 2 MG PO CAPS: 2.0000 mg | ORAL_CAPSULE | ORAL | Status: DC | PRN

## 2014-07-06 MED ORDER — ADULT MULTIVITAMIN W/MINERALS CH: 1.0000 | ORAL_TABLET | Freq: Every day | ORAL | Status: DC

## 2014-07-06 MED ORDER — CHLORDIAZEPOXIDE HCL 25 MG PO CAPS
25.0000 mg | ORAL_CAPSULE | Freq: Once | ORAL | Status: AC
  Administered 2014-07-06: 25 mg via ORAL
  Filled 2014-07-06: qty 1

## 2014-07-06 MED ORDER — ONDANSETRON 4 MG PO TBDP: 4.0000 mg | ORAL_TABLET | Freq: Four times a day (QID) | ORAL | Status: DC | PRN

## 2014-07-06 MED ORDER — HYDROXYZINE HCL 25 MG PO TABS: 25.0000 mg | ORAL_TABLET | Freq: Four times a day (QID) | ORAL | Status: DC | PRN

## 2014-07-06 MED ORDER — VITAMIN B-1 100 MG PO TABS: 100.0000 mg | ORAL_TABLET | Freq: Every day | ORAL | Status: DC

## 2014-07-06 NOTE — Progress Notes (Signed)
Pt transferred to the Adult unit #306 from Obs unit. Escorted per Chong Sicilian, Therapist, sports. Report given to [place], Therapist, sports.

## 2014-07-06 NOTE — Progress Notes (Signed)
Patient did not attend the evening speaker [place] meeting. Pt was newly admitted and remained in bed during group.

## 2014-07-06 NOTE — Progress Notes (Signed)
D) This is a 58 year old admitted voluntarily, from the OBS unit,  to the service of Dr. Sabra Heck, inpatient. Pt reports that she has been drinking a 12 pack a dayi for "a long time" and has also been using crack, cocaine. Pt is a poor historian. Was inpatient in 2014 for the same diagnosis. Has a history of migraines, depression, asthma, chronic back pain and alcohol and cocaine abuse. Pt is shaking with a CIWA of 12. A) Pt oriented to the unit briefly, and taken to her room to lie down due to her unstable ambulation. Placed on Fall Precautions and instructed of not getting out of bed. Dinner brought to Pt along with starting fluids. R) Pt states she feels shaky and wants to lie down and not go to the cafeteria.

## 2014-07-06 NOTE — Progress Notes (Signed)
Nursing Shift Note Obs Denies SI, HI, AVH. Flat and sad affect. Depressed mood, anxious behavior. Notable shakiness. Minimal interaction with staff. Pt asleep most of shift. Support given. Monitored for safety. Will continue to stabilize.

## 2014-07-06 NOTE — Progress Notes (Signed)
[facility] Observation unit MD Progress Note  07/06/2014 1:11 PM [name]  MRN:  [id number] Subjective:  Patient was admitted to our observation unit for alcohol detox and also Cocaine abuse.  Patient is well known to our [facility] for alcohol dependence and cocaine abuse.  Patient was last treated and discharged from our inpatient unit in 06/02/2014.  Patient, during this assessment was speaking in very low tone, shaking and sleepy.  She believes she is using alcohol to treat her depression instead of taking her MH medications.  Patient is accepted in our unit but is waiting for a discharge to happen.  She will be moved as soon as her bed is ready.  She denies SI/HI/AVH.  We will continue to offer her Librium for her detox from alcohol per protocol.   Diagnosis:   DSM5: Schizophrenia Disorders:   Obsessive-Compulsive Disorders:   Trauma-Stressor Disorders:   Substance/Addictive Disorders:  Alcohol Related Disorder - Severe (303.90) Depressive Disorders:  Major Depressive Disorder - Severe (296.23) Total Time spent with patient: 30 minutes  Axis I: Alcohol dependence, Cocaine abuse Axis II: Deferred Axis III:  Past Medical History  Diagnosis Date  . Depression   . Migraine headache   . Chronic back pain   . Alcohol dependence 06/11/2013  . Asthma   . Cocaine abuse    Axis IV: other psychosocial or environmental problems and problems related to social environment Axis V: 41-50 serious symptoms  ADL's:  Impaired  Sleep: Fair  Appetite:  Poor  Suicidal Ideation:  Denies Homicidal Ideation:  Denies  Psychiatric Specialty Exam: Physical Exam  ROS  Blood pressure 122/97, pulse 91, temperature 97.5 F (36.4 C), temperature source Oral, resp. rate 16, height $RemoveBe'5\' 7"'YWgsCAJcz$  (1.702 m), weight 69.4 kg (153 lb), SpO2 98.00%.Body mass index is 23.96 kg/(m^2).  General Appearance: Casual and Disheveled  Eye Contact::  Fair  Speech:  Garbled, Normal Rate and low tone, patient is medicated with with  Librium for her alcohol withdrawal symptoms.  Volume:  low  Mood:  Anxious and Depressed  Affect:  Congruent, Depressed and Flat  Thought Process:  Coherent, Goal Directed and Intact  Orientation:  Full (Time, Place, and Person)  Thought Content:  WDL  Suicidal Thoughts:  No  Homicidal Thoughts:  No  Memory:  Immediate;   Fair Recent;   Fair Remote;   Fair  Judgement:  Poor  Insight:  Fair  Psychomotor Activity:  Tremor  Concentration:  Fair  Recall:  NA  Fund of Knowledge:Good  Language: Good  Akathisia:  NA  Handed:  Right  AIMS (if indicated):     Assets:  Desire for Improvement  Sleep:      Musculoskeletal: Strength & Muscle Tone: Found in bed Gait & Station: unsteady, Per report from staff, her gait is unsteady, patient is in the acute phase of alcohol withdrawal. Patient leans: N/A and Found in bed sleeping  Current Medications: Current Facility-Administered Medications  Medication Dose Route Frequency Provider Last Rate Last Dose  . acetaminophen (TYLENOL) tablet 650 mg  650 mg Oral Q6H PRN Dara Hoyer, PA-C      . alum & mag hydroxide-simeth (MAALOX/MYLANTA) 200-200-20 MG/5ML suspension 30 mL  30 mL Oral Q4H PRN Dara Hoyer, PA-C      . chlordiazePOXIDE (LIBRIUM) capsule 25 mg  25 mg Oral Q6H PRN Dara Hoyer, PA-C   25 mg at 07/06/14 0630  . FLUoxetine (PROZAC) capsule 40 mg  40 mg Oral Daily Jessy Oto  Harold Hedge, PA-C   40 mg at 07/06/14 5027  . hydrOXYzine (ATARAX/VISTARIL) tablet 25 mg  25 mg Oral Q6H PRN Dara Hoyer, PA-C   25 mg at 07/06/14 0947  . loperamide (IMODIUM) capsule 2-4 mg  2-4 mg Oral PRN Dara Hoyer, PA-C   4 mg at 07/05/14 2150  . magnesium hydroxide (MILK OF MAGNESIA) suspension 30 mL  30 mL Oral Daily PRN Dara Hoyer, PA-C      . multivitamin with minerals tablet 1 tablet  1 tablet Oral Daily Dara Hoyer, PA-C   1 tablet at 07/06/14 7412  . nicotine (NICODERM CQ - dosed in mg/24 hours) patch 21 mg  21 mg Transdermal Q0600  Dara Hoyer, PA-C   21 mg at 07/06/14 8786  . ondansetron (ZOFRAN-ODT) disintegrating tablet 4 mg  4 mg Oral Q6H PRN Dara Hoyer, PA-C      . risperiDONE (RISPERDAL) tablet 1 mg  1 mg Oral QHS Dara Hoyer, PA-C   1 mg at 07/05/14 2150  . thiamine (VITAMIN B-1) tablet 100 mg  100 mg Oral Daily Dara Hoyer, PA-C   100 mg at 07/06/14 7672  . traZODone (DESYREL) tablet 50 mg  50 mg Oral QHS PRN Dara Hoyer, PA-C   50 mg at 07/05/14 2150    Lab Results:  Results for orders placed during the hospital encounter of 07/05/14 (from the past 48 hour(s))  URINE RAPID DRUG SCREEN (HOSP PERFORMED)     Status: Abnormal   Collection Time    07/05/14  2:44 PM      Result Value Ref Range   Opiates NONE DETECTED  NONE DETECTED   Cocaine POSITIVE (*) NONE DETECTED   Benzodiazepines NONE DETECTED  NONE DETECTED   Amphetamines NONE DETECTED  NONE DETECTED   Tetrahydrocannabinol NONE DETECTED  NONE DETECTED   Barbiturates NONE DETECTED  NONE DETECTED   Comment:            DRUG SCREEN FOR MEDICAL PURPOSES     ONLY.  IF CONFIRMATION IS NEEDED     FOR ANY PURPOSE, NOTIFY LAB     WITHIN 5 DAYS.                LOWEST DETECTABLE LIMITS     FOR URINE DRUG SCREEN     Drug Class       Cutoff (ng/mL)     Amphetamine      1000     Barbiturate      200     Benzodiazepine   094     Tricyclics       709     Opiates          300     Cocaine          300     THC              50  CBC     Status: None   Collection Time    07/05/14  2:46 PM      Result Value Ref Range   WBC 6.1  4.0 - 10.5 K/uL   RBC 5.03  3.87 - 5.11 MIL/uL   Hemoglobin 15.0  12.0 - 15.0 g/dL   HCT 42.8  36.0 - 46.0 %   MCV 85.1  78.0 - 100.0 fL   MCH 29.8  26.0 - 34.0 pg   MCHC 35.0  30.0 - 36.0 g/dL   RDW 13.2  11.5 - 15.5 %   Platelets 216  150 - 400 K/uL  COMPREHENSIVE METABOLIC PANEL     Status: Abnormal   Collection Time    07/05/14  2:46 PM      Result Value Ref Range   Sodium 138  137 - 147 mEq/L   Potassium  3.1 (*) 3.7 - 5.3 mEq/L   Chloride 96  96 - 112 mEq/L   CO2 23  19 - 32 mEq/L   Glucose, Bld 95  70 - 99 mg/dL   BUN 6  6 - 23 mg/dL   Creatinine, Ser 0.83  0.50 - 1.10 mg/dL   Calcium 9.2  8.4 - 10.5 mg/dL   Total Protein 6.8  6.0 - 8.3 g/dL   Albumin 3.9  3.5 - 5.2 g/dL   AST 33  0 - 37 U/L   Comment: HEMOLYSIS AT THIS LEVEL MAY AFFECT RESULT   ALT 26  0 - 35 U/L   Alkaline Phosphatase 104  39 - 117 U/L   Total Bilirubin 0.7  0.3 - 1.2 mg/dL   GFR calc non Af Amer 76 (*) >90 mL/min   GFR calc Af Amer 88 (*) >90 mL/min   Comment: (NOTE)     The eGFR has been calculated using the CKD EPI equation.     This calculation has not been validated in all clinical situations.     eGFR's persistently <90 mL/min signify possible Chronic Kidney     Disease.   Anion gap 19 (*) 5 - 15  ETHANOL     Status: Abnormal   Collection Time    07/05/14  2:46 PM      Result Value Ref Range   Alcohol, Ethyl (B) 72 (*) 0 - 11 mg/dL   Comment:            LOWEST DETECTABLE LIMIT FOR     SERUM ALCOHOL IS 11 mg/dL     FOR MEDICAL PURPOSES ONLY  ACETAMINOPHEN LEVEL     Status: None   Collection Time    07/05/14  2:46 PM      Result Value Ref Range   Acetaminophen (Tylenol), Serum <15.0  10 - 30 ug/mL   Comment:            THERAPEUTIC CONCENTRATIONS VARY     SIGNIFICANTLY. A RANGE OF 10-30     ug/mL MAY BE AN EFFECTIVE     CONCENTRATION FOR MANY PATIENTS.     HOWEVER, SOME ARE BEST TREATED     AT CONCENTRATIONS OUTSIDE THIS     RANGE.     ACETAMINOPHEN CONCENTRATIONS     >150 ug/mL AT 4 HOURS AFTER     INGESTION AND >50 ug/mL AT 12     HOURS AFTER INGESTION ARE     OFTEN ASSOCIATED WITH TOXIC     REACTIONS.  SALICYLATE LEVEL     Status: Abnormal   Collection Time    07/05/14  2:46 PM      Result Value Ref Range   Salicylate Lvl <7.5 (*) 2.8 - 20.0 mg/dL    Physical Findings: AIMS: Facial and Oral Movements Muscles of Facial Expression: None, normal Lips and Perioral Area: Minimal Jaw:  None, normal Tongue: None, normal,Extremity Movements Upper (arms, wrists, hands, fingers): Minimal Lower (legs, knees, ankles, toes): None, normal, Trunk Movements Neck, shoulders, hips: None, normal, Overall Severity Severity of abnormal movements (highest score from questions above): Minimal Incapacitation due to abnormal movements: None, normal Patient's awareness of  abnormal movements (rate only patient's report): Aware, no distress, Dental Status Current problems with teeth and/or dentures?: No Does patient usually wear dentures?: No  CIWA:  CIWA-Ar Total: 11 COWS:  COWS Total Score: 3  Treatment Plan Summary: Waiting for admission bed, accepted at our chemical dependency unit  Plan: Will transfer to our Chemical dependency unit   Medical Decision Making Problem Points:  Established problem, stable/improving (1) Data Points:  Review and summation of old records (2) Review of medication regiment & side effects (2) Review of new medications or change in dosage (2)  I certify that OBSERVATION UNIT services furnished can reasonably be expected to improve the patient's condition.   Charmaine Downs, C   PMHNP-BC 07/06/2014, 1:11 PM I agree with plan.

## 2014-07-06 NOTE — Progress Notes (Signed)
Writer observed patient lying in bed asleep. Writer woke patient up and spoke with her concerning any needs she may have. Patient c/o being cold and was given an extra blanket and was encouraged to drink her gatorade. Patient reports that she is very sleepy and was informed of the call bell within reach if needing to get up for any reason. Patient is very shaky and drowsy. Writer inquired of her substance use and she reports that she did over 1 gram of cocaine, drank a pint of liquor and a 12 pack of beer. Writer encouraged her to rest and informed her of scheduled hs meds. She denies si/hi/a/v hallucinations. Safety maintained on unit with 15 min checks.

## 2014-07-07 DIAGNOSIS — F329 Major depressive disorder, single episode, unspecified: Secondary | ICD-10-CM

## 2014-07-07 MED ORDER — POTASSIUM CHLORIDE CRYS ER 20 MEQ PO TBCR
20.0000 meq | EXTENDED_RELEASE_TABLET | Freq: Two times a day (BID) | ORAL | Status: AC
  Administered 2014-07-08 – 2014-07-11 (×6): 20 meq via ORAL
  Filled 2014-07-07 (×8): qty 1

## 2014-07-07 MED ORDER — CHLORDIAZEPOXIDE HCL 25 MG PO CAPS
25.0000 mg | ORAL_CAPSULE | ORAL | Status: AC
  Administered 2014-07-09 (×2): 25 mg via ORAL
  Filled 2014-07-07 (×2): qty 1

## 2014-07-07 MED ORDER — CHLORDIAZEPOXIDE HCL 25 MG PO CAPS
25.0000 mg | ORAL_CAPSULE | Freq: Every day | ORAL | Status: AC
  Administered 2014-07-10: 25 mg via ORAL
  Filled 2014-07-07: qty 1

## 2014-07-07 MED ORDER — CHLORDIAZEPOXIDE HCL 25 MG PO CAPS
25.0000 mg | ORAL_CAPSULE | Freq: Once | ORAL | Status: AC
  Administered 2014-07-07: 25 mg via ORAL
  Filled 2014-07-07: qty 1

## 2014-07-07 MED ORDER — CHLORDIAZEPOXIDE HCL 25 MG PO CAPS
25.0000 mg | ORAL_CAPSULE | Freq: Three times a day (TID) | ORAL | Status: AC
  Administered 2014-07-08 (×3): 25 mg via ORAL
  Filled 2014-07-07 (×4): qty 1

## 2014-07-07 MED ORDER — CHLORDIAZEPOXIDE HCL 25 MG PO CAPS
25.0000 mg | ORAL_CAPSULE | Freq: Four times a day (QID) | ORAL | Status: AC
Start: 2014-07-07 — End: 2014-07-08
  Administered 2014-07-07 (×2): 25 mg via ORAL
  Filled 2014-07-07 (×3): qty 1

## 2014-07-07 MED ORDER — ENSURE COMPLETE PO LIQD: 237.0000 mL | Freq: Two times a day (BID) | ORAL | Status: DC

## 2014-07-07 MED ORDER — ENSURE COMPLETE PO LIQD
237.0000 mL | Freq: Two times a day (BID) | ORAL | Status: DC
  Administered 2014-07-07 – 2014-07-12 (×7): 237 mL via ORAL

## 2014-07-07 NOTE — Care Management Utilization Note (Signed)
Per State Regulation 482.30  The chart was reviewed for necessity with respect to the patient's Admission/ Duration of stay. 07/05/14  Next Review Date: 8/32/54  Conception Oms, RN, BSN

## 2014-07-07 NOTE — BHH Suicide Risk Assessment (Signed)
   Nursing information obtained from:  Patient Demographic factors:  Living alone;Unemployed Current Mental Status:  NA Loss Factors:  Financial problems / change in socioeconomic status Historical Factors:  Prior suicide attempts;Family history of mental illness or substance abuse Risk Reduction Factors:  Positive social support Total Time spent with patient: 45 minutes  CLINICAL FACTORS:  Alcohol dependence, alcohol withdrawal, depression  Psychiatric Specialty Exam: Physical Exam  ROS  Blood pressure 147/85, pulse 92, temperature 98.2 F (36.8 C), temperature source Oral, resp. rate 20, height 5\' 7"  (1.702 m), weight 69.4 kg (153 lb), SpO2 98.00%.Body mass index is 23.96 kg/(m^2).  General Appearance: Fairly Groomed  Engineer, water::  Good  Speech:  Slow  Volume:  Decreased  Mood:  Depressed  Affect:  Appropriate- constricted  Thought Process:  Goal Directed and Linear  Orientation:  Other:  alert and attentive  Thought Content:  denies hallucinations at this time, no psychotic symptoms currently  Suicidal Thoughts:  No- at this time denies any suicidal or homicidal ideations  Homicidal Thoughts:  No  Memory:  remote and recent grossly intact, 3/3 immediate, 2/3 at 3 minutes   Judgement:  Fair  Insight:  Fair  Psychomotor Activity:  Tremor  Concentration:  Fair  Recall:  [place] of Knowledge:Fair  Language: Fair  Akathisia:  Negative  Handed:  Right  AIMS (if indicated):     Assets:  Desire for Improvement Resilience  Sleep:  Number of Hours: 6.25   Musculoskeletal: Strength & Muscle Tone: within normal limits- has distal tremors  Gait & Station: slow gait Patient leans: Right  COGNITIVE FEATURES THAT CONTRIBUTE TO RISK:  Polarized thinking    SUICIDE RISK:   Moderate:  Frequent suicidal ideation with limited intensity, and duration, some specificity in terms of plans, no associated intent, good self-control, limited dysphoria/symptomatology, some risk factors  present, and identifiable protective factors, including available and accessible social support.  PLAN OF CARE:Patient will be admitted to inpatient psychiatric unit for stabilization and safety. Will provide and encourage milieu participation. Provide medication management and maked adjustments as needed.  Will follow daily. Will also provide medical management to minimize risk of alcohol withdrawal.   I certify that inpatient services furnished can reasonably be expected to improve the patient's condition.  Ozzie Knobel, [place] 07/07/2014, 12:28 PM

## 2014-07-07 NOTE — BHH Counselor (Signed)
Adult Comprehensive Assessment  Patient ID: [name], female   DOB: [birth year], 58 y.o.   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 Housing / Lack of housing: None Physical health (include injuries & life threatening diseases): None Social relationships: None Substance abuse: Patient reports abusing crack cocaine Bereavement / Loss: None  Living/Environment/Situation:  Living Arrangements: Alone Living conditions (as described by patient or guardian): Okay How long has patient lived in current situation?: for a while What is atmosphere in current home: Comfortable  Family History:  Marital status: Single Does patient have children?: Yes How many children?: 2 How is patient's relationship with their children?: Okay relationship with adult daughters  Childhood History:  By whom was/is the patient raised?: Both parents Additional childhood history information: Good childhood Description of patient's relationship with caregiver when they were a child: Good relationship with parents Patient's description of current relationship with people who raised him/her: Okay with father - mother is deceased Does patient have siblings?: Yes Number of Siblings: 7 Description of patient's current relationship with siblings: Patient reports having a good relationship with siblings Did patient suffer any verbal/emotional/physical/sexual abuse as a child?: No Did patient suffer from severe childhood neglect?: No Has patient ever been sexually abused/assaulted/raped as an adolescent or adult?: No Has patient been effected by domestic violence as an adult?: No  Education:  Highest grade of school patient has completed: 11th Currently a student?: No Learning  disability?: No  Employment/Work Situation:   Employment situation: On disability Why is patient on disability: Mental Health How long has patient been on disability: Two years Patient's job has been impacted by current illness: No What is the longest time patient has a held a job?: [place] years Where was the patient employed at that time?: [employer] Has patient ever been in the [employer]?: No Has patient ever served in Recruitment consultant?: No  Financial Resources:   Museum/gallery curator resources: Marine scientist SSDI;Medicaid Does patient have a Programmer, applications or guardian?: No  Alcohol/Substance Abuse:   What has been your use of drugs/alcohol within the last 12 months?: Patient advised of abusing crack cocaine daily.  She stated she uses a lot but unable to state how much. If attempted suicide, did drugs/alcohol play a role in this?: No Alcohol/Substance Abuse Treatment Hx: Past Tx, Inpatient If yes, describe treatment: [facility] four years ago Has alcohol/substance abuse ever caused legal problems?: No  Social Support System:   Heritage manager System: None Describe Community Support System: N/A Type of faith/religion: None How does patient's faith help to cope with current illness?: N/A  Leisure/Recreation:   Leisure and Hobbies: Watching TV  Strengths/Needs:   What things does the patient do well?: Cleaning In what areas does patient struggle / problems for patient: Depression  Discharge Plan:   Does patient have access to transportation?: Yes Will patient be returning to same living situation after discharge?: Yes Currently receiving community mental health services: Yes (From Whom) [facility]) If no, would patient like referral for services when discharged?: Yes (What county?) ([place]) Does patient have financial barriers related to discharge medications?: No  Summary/Recommendations:  [name] is a 58 years old African American female admitted with Major Depression  Disorder and Substance abuse.  She will  benefit from crisis stabilization, evaluation for medication, psycho-education groups for coping skills  development, group therapy and case management for discharge planning.     Jermani Pund, [place]. 07/07/2014

## 2014-07-07 NOTE — H&P (Signed)
Case discussed, agree with plan 

## 2014-07-07 NOTE — Progress Notes (Addendum)
Pt woke and came to the medication window, She stated ,"i just feel so tried and need to be placed on ensure. " NP made aware. Pt has slight shakes .She contracts for safety and denies SI and HI.t has been attending groups. She has poor eye contact the patient did receive her librium earlier. Pt has not yet completed her self inventory sheet. 10:40am- Pt was given 25mg  of librium po and 25mg  of visteral po for the shakes. She denies N?V and did state she had night sweats last pm.5pm-Pt handed in her self inventory. Pt was given 2 tylenol for ankle pain a 5/10.

## 2014-07-07 NOTE — [facility] Group Notes (Signed)
[facility] Group Therapy          Overcoming Obstacles       1:15 -2:30        07/07/2014       Type of Therapy:  Group Therapy  Participation Level:  Did not attend group.   [name] 07/07/2014

## 2014-07-07 NOTE — Plan of Care (Signed)
Problem: Alteration in mood & ability to function due to Goal: LTG-Patient demonstrates decreased signs of withdrawal (Patient demonstrates decreased signs of withdrawal to the point the patient is safe to return home and continue treatment in an outpatient setting)  Outcome: Not Progressing Patient shows signs of withdrawal and is medicated accordingly.

## 2014-07-07 NOTE — Progress Notes (Signed)
The focus of this group is to help patients review their daily goal of treatment and discuss progress on daily workbooks. Pt did not attend group. Instead, pt was in bed asleep for entire group session.

## 2014-07-07 NOTE — H&P (Signed)
Psychiatric Admission Assessment Adult  Patient Identification:  [name] Date of Evaluation:  07/07/2014 Chief Complaint:  substance abuse  History of Present Illness:: [name] is a 58 yo AAF who was initially at [facility] ED for etoh and cocaine abuse.  She states she had not been feeling well and was feeling weak in the last 2 weeks.  She was transferred to [facility] inpatient adult for further evaluation and treatment.  [name] reports that she had been abusing alcohol, cocaine and cannabis off and on for several years.  She also quantified 10 years that she has had depression wherein she takes prozac, respirdal and trazodone for insomnia.  She was off her meds for 2 days before seeking treatment at [facility].  She denies SI/HI/AVH.    Elements:  Location:  Alcohol and cocaine dependence, Major depressive disorder. Quality:  High anxiety levels, Restlessness, worsening symptoms of depressions. Severity:  Severe, daily use of alcohol/cocaine to cope. Timing:  Symptoms worsened in the last 2 weeks. Duration:  Chronic,. Context:  Been depressed for  a long time, use alcohol/cocaine to cope". Associated Signs/Synptoms: Depression Symptoms:  depressed mood, insomnia, loss of energy/fatigue, (Hypo) Manic Symptoms:  NA Anxiety Symptoms:  NA Psychotic Symptoms:  NA    PTSD Symptoms: Negative Total Time spent with patient: greater than 30 min  Psychiatric Specialty Exam: Physical Exam  Vitals reviewed. Constitutional: She is oriented to person, place, and time. She appears well-developed and well-nourished.  HENT:  Head: Normocephalic.  Eyes: Pupils are equal, round, and reactive to light. Right eye exhibits no discharge. Left eye exhibits no discharge. No scleral icterus.  Neck: Normal range of motion.  Cardiovascular: Normal rate.   Respiratory: Effort normal.  GI: Soft.  Genitourinary:  NA  Musculoskeletal: Normal range of motion.  Neurological: She is alert and oriented to person,  place, and time.  Skin: Skin is warm.  Psychiatric: Her speech is normal and behavior is normal. Thought content normal. Her mood appears anxious. Cognition and memory are normal. Impaired: Unable to recall if she sought mental care at [facility] admission   She expresses impulsivity. She exhibits a depressed mood.    Review of Systems  Constitutional: Positive for chills, malaise/fatigue and diaphoresis.  HENT: Negative.   Eyes: Negative.   Respiratory: Negative.   Cardiovascular: Negative.   Gastrointestinal: Positive for nausea.  Genitourinary: Negative.   Musculoskeletal: Positive for myalgias.  Skin: Negative.   Neurological: Positive for dizziness, tremors and weakness.  Endo/Heme/Allergies: Negative.   Psychiatric/Behavioral: Positive for depression ( Rated 8) and substance abuse (Alcohol, cocaine, cannabis dependence). Negative for suicidal ideas, hallucinations and memory loss. The patient is nervous/anxious (Rates 8) and has insomnia.     Blood pressure 147/85, pulse 92, temperature 98.2 F (36.8 C), temperature source Oral, resp. rate 20, height $RemoveBe'5\' 7"'[id number]$  (1.702 m), weight 69.4 kg (153 lb), SpO2 98.00%.Body mass index is 23.96 kg/(m^2).  General Appearance: Disheveled  Eye Sport and exercise psychologist::  Fair  Speech:  Clear and Coherent and at times the speech is garbled  Volume:  Normal  Mood:  Anxious and Depressed  Affect:  Flat  Thought Process:  Intact  Orientation:  Full (Time, Place, and Person)  Thought Content:  Rumination  Suicidal Thoughts:  No  Homicidal Thoughts:  No  Memory:  Immediate;   Fair Recent;   Fair Remote;   Poor  Judgement:  Fair  Insight:  Present  Psychomotor Activity:  Restlessness  Concentration:  Fair  Recall:  [place]  Language: Good  Akathisia:  NA  Handed:  Right  AIMS (if indicated):     Assets:  Communication Skills Desire for Improvement  Sleep:  Number of Hours: 6.25    Musculoskeletal: Strength & Muscle Tone:  within normal limits Gait & Station: normal Patient leans: N/A  Past Psychiatric History: Diagnosis:  Substance induced mood disorder  Hospitalizations:  [facility] adult unit  Outpatient Care: None reported  Substance Abuse Care:  [facility]  Self-Mutilation:  NA  Suicidal Attempts:  NA  Violent Behaviors:  NA   Past Medical History:   Past Medical History  Diagnosis Date  . Depression   . Migraine headache   . Chronic back pain   . Alcohol dependence 06/11/2013  . Asthma   . Cocaine abuse    None. Allergies:  No Known Allergies PTA Medications: Prescriptions prior to admission  Medication Sig Dispense Refill  . FLUoxetine (PROZAC) 40 MG capsule Take 1 capsule (40 mg total) by mouth daily. For depression  30 capsule  0  . hydrOXYzine (ATARAX/VISTARIL) 25 MG tablet Take 1 tablet (25 mg total) by mouth every 6 (six) hours as needed for anxiety.  14 tablet  0  . Multiple Vitamin (MULTIVITAMIN WITH MINERALS) TABS tablet Take 1 tablet by mouth daily.      . risperiDONE (RISPERDAL) 1 MG tablet Take 1 tablet (1 mg total) by mouth at bedtime. For mood control  14 tablet  0  . traZODone (DESYREL) 50 MG tablet Take 1 tablet (50 mg total) by mouth at bedtime as needed for sleep. For sleep  7 tablet  0    Previous Psychotropic Medications:  Medication/Dose  See med list               Substance Abuse History in the last 12 months:  Yes.    Consequences of Substance Abuse: Withdrawal Symptoms:   Tremors  Social History:  reports that she has been smoking Cigarettes.  She has a 35 pack-year smoking history. She has never used smokeless tobacco. She reports that she drinks alcohol. She reports that she uses illicit drugs ("Crack" cocaine and Cocaine). Additional Social History: Pain Medications: denies Prescriptions: Prozac, Trazodone, Risperdal Over the Counter: denies History of alcohol / drug use?: Yes Longest period of sobriety (when/how long): 2 years Negative  Consequences of Use: Personal relationships;Financial Withdrawal Symptoms: Tremors;Tingling Name of Substance 1: Alcohol 1 - Age of First Use: refused to answer 1 - Amount (size/oz): pt reports "I'm not sure how much I drink." 1 - Frequency: daily 1 - Duration: several years 1 - Last Use / Amount: today Name of Substance 2: cocaine 2 - Age of First Use: refused to answer 2 - Amount (size/oz): refused to answer 2 - Frequency: pt reports "daily if I can get it." 2 - Duration: several years 2 - Last Use / Amount: 07/05/14  Current Place of Residence:  [place], [place] of Birth:  [place], [place]    Family Members: "My 2 sons"  Marital Status:  Single  Children: 2  Sons:  2  Daughters:  0  Relationships: Single  Education:  completed until 11th grade  Educational Problems/Performance:  Did not complete high school  Religious Beliefs/Practices:  NA  History of Abuse (Emotional/Phsycial/Sexual):  Denies  Occupational Experiences:  Back injury and is on disability  Military History:  None.  Legal History:  Denies  Hobbies/Interests:  NA  Family History:  History reviewed. No pertinent family history.  Results for orders placed during the hospital encounter of 07/05/14 (from the past 72 hour(s))  URINE RAPID DRUG SCREEN (HOSP PERFORMED)     Status: Abnormal   Collection Time    07/05/14  2:44 PM      Result Value Ref Range   Opiates NONE DETECTED  NONE DETECTED   Cocaine POSITIVE (*) NONE DETECTED   Benzodiazepines NONE DETECTED  NONE DETECTED   Amphetamines NONE DETECTED  NONE DETECTED   Tetrahydrocannabinol NONE DETECTED  NONE DETECTED   Barbiturates NONE DETECTED  NONE DETECTED   Comment:            DRUG SCREEN FOR MEDICAL PURPOSES     ONLY.  IF CONFIRMATION IS NEEDED     FOR ANY PURPOSE, NOTIFY LAB     WITHIN 5 DAYS.                LOWEST DETECTABLE LIMITS     FOR URINE DRUG SCREEN     Drug Class       Cutoff (ng/mL)     Amphetamine      1000      Barbiturate      200     Benzodiazepine   384     Tricyclics       536     Opiates          300     Cocaine          300     THC              50  CBC     Status: None   Collection Time    07/05/14  2:46 PM      Result Value Ref Range   WBC 6.1  4.0 - 10.5 K/uL   RBC 5.03  3.87 - 5.11 MIL/uL   Hemoglobin 15.0  12.0 - 15.0 g/dL   HCT 42.8  36.0 - 46.0 %   MCV 85.1  78.0 - 100.0 fL   MCH 29.8  26.0 - 34.0 pg   MCHC 35.0  30.0 - 36.0 g/dL   RDW 13.2  11.5 - 15.5 %   Platelets 216  150 - 400 K/uL  COMPREHENSIVE METABOLIC PANEL     Status: Abnormal   Collection Time    07/05/14  2:46 PM      Result Value Ref Range   Sodium 138  137 - 147 mEq/L   Potassium 3.1 (*) 3.7 - 5.3 mEq/L   Chloride 96  96 - 112 mEq/L   CO2 23  19 - 32 mEq/L   Glucose, Bld 95  70 - 99 mg/dL   BUN 6  6 - 23 mg/dL   Creatinine, Ser 0.83  0.50 - 1.10 mg/dL   Calcium 9.2  8.4 - 10.5 mg/dL   Total Protein 6.8  6.0 - 8.3 g/dL   Albumin 3.9  3.5 - 5.2 g/dL   AST 33  0 - 37 U/L   Comment: HEMOLYSIS AT THIS LEVEL MAY AFFECT RESULT   ALT 26  0 - 35 U/L   Alkaline Phosphatase 104  39 - 117 U/L   Total Bilirubin 0.7  0.3 - 1.2 mg/dL   GFR calc non Af Amer 76 (*) >90 mL/min   GFR calc Af Amer 88 (*) >90 mL/min   Comment: (NOTE)     The eGFR has been calculated using the CKD EPI equation.  This calculation has not been validated in all clinical situations.     eGFR's persistently <90 mL/min signify possible Chronic Kidney     Disease.   Anion gap 19 (*) 5 - 15  ETHANOL     Status: Abnormal   Collection Time    07/05/14  2:46 PM      Result Value Ref Range   Alcohol, Ethyl (B) 72 (*) 0 - 11 mg/dL   Comment:            LOWEST DETECTABLE LIMIT FOR     SERUM ALCOHOL IS 11 mg/dL     FOR MEDICAL PURPOSES ONLY  ACETAMINOPHEN LEVEL     Status: None   Collection Time    07/05/14  2:46 PM      Result Value Ref Range   Acetaminophen (Tylenol), Serum <15.0  10 - 30 ug/mL   Comment:            THERAPEUTIC  CONCENTRATIONS VARY     SIGNIFICANTLY. A RANGE OF 10-30     ug/mL MAY BE AN EFFECTIVE     CONCENTRATION FOR MANY PATIENTS.     HOWEVER, SOME ARE BEST TREATED     AT CONCENTRATIONS OUTSIDE THIS     RANGE.     ACETAMINOPHEN CONCENTRATIONS     >150 ug/mL AT 4 HOURS AFTER     INGESTION AND >50 ug/mL AT 12     HOURS AFTER INGESTION ARE     OFTEN ASSOCIATED WITH TOXIC     REACTIONS.  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 Depression   . Migraine headache   . Chronic back pain   . Alcohol dependence 06/11/2013  . Asthma   . Cocaine abuse    AXIS IV:  economic problems, educational problems, occupational problems and other psychosocial or environmental problems AXIS V:  1-10 persistent dangerousness to self and others present  Treatment Plan/Recommendations:  Treatment Plan/Recommendations:  Admit for substance abuse treatment, crisis management and mood stabilization. Medication management to re-stabilize current mood symptoms Group counseling sessions for coping skills Medical consults as needed Review and reinstate any pertinent home medications for other health problems  Treatment Plan Summary: Daily contact with patient to assess and evaluate symptoms and progress in treatment Medication management Current Medications:  Current Facility-Administered Medications  Medication Dose Route Frequency  Provider Last Rate Last Dose  . acetaminophen (TYLENOL) tablet 650 mg  650 mg Oral Q6H PRN Dara Hoyer, PA-C      . alum & mag hydroxide-simeth (MAALOX/MYLANTA) 200-200-20 MG/5ML suspension 30 mL  30 mL Oral Q4H PRN Dara Hoyer, PA-C      . chlordiazePOXIDE (LIBRIUM) capsule 25 mg  25 mg Oral Q6H PRN Dara Hoyer, PA-C   25 mg at 07/07/14 0617  . feeding supplement (ENSURE COMPLETE) (ENSURE COMPLETE) liquid 237 mL  237 mL Oral BID BM Encarnacion Slates,  NP      . FLUoxetine (PROZAC) capsule 40 mg  40 mg Oral Daily Dara Hoyer, PA-C   40 mg at 07/07/14 1856  . hydrOXYzine (ATARAX/VISTARIL) tablet 25 mg  25 mg Oral Q6H PRN Dara Hoyer, PA-C   25 mg at 07/06/14 0947  . loperamide (IMODIUM) capsule 2-4 mg  2-4 mg Oral PRN Dara Hoyer, PA-C   4 mg at 07/05/14 2150  . magnesium hydroxide (MILK OF MAGNESIA) suspension 30 mL  30 mL Oral Daily PRN Dara Hoyer, PA-C      . multivitamin with minerals tablet 1 tablet  1 tablet Oral Daily Dara Hoyer, PA-C   1 tablet at 07/07/14 [phone]  . nicotine (NICODERM CQ - dosed in mg/24 hours) patch 21 mg  21 mg Transdermal Q0600 Dara Hoyer, PA-C   21 mg at 07/06/14 7026  . ondansetron (ZOFRAN-ODT) disintegrating tablet 4 mg  4 mg Oral Q6H PRN Dara Hoyer, PA-C      . risperiDONE (RISPERDAL) tablet 1 mg  1 mg Oral QHS Dara Hoyer, PA-C   1 mg at 07/06/14 2157  . thiamine (B-1) injection 100 mg  100 mg Intramuscular Once Benjamine Mola, FNP      . thiamine (VITAMIN B-1) tablet 100 mg  100 mg Oral Daily Dara Hoyer, PA-C   100 mg at 07/07/14 0834  . traZODone (DESYREL) tablet 50 mg  50 mg Oral QHS PRN Dara Hoyer, PA-C   50 mg at 07/05/14 2150    Observation Level/Precautions:  15 minute checks  Laboratory:  per ED  Psychotherapy:  Group counseling sessions  Medications:  See medication list  Consultations:  As needed  Discharge Concerns:  Sobriety, mood stability  Estimated LOS:  5-7 days  Other:     I certify that  inpatient services furnished can reasonably be expected to improve the patient's condition.   Kerrie Buffalo MAY, AGNP-BC 9/14/201510:09 AM  I have reviewed NP's Note, assessement, diagnosis and plan, and agree. I have also met with patient and completed suicide risk assessment. Patient is a 58 year old woman with Alcohol dependence, Cocaine Abuse, and Depression. She had stopped her psychiatric medications ( Risperidone and Prozac ) prior to admission, and had become increasingly depressed. Admission BAL 75, UDS positive for cocaine. Will manage with standing librium taper and restart Prozac and Risperidone which she states have been well tolerated and helpful. Neita Garnet, MD

## 2014-07-07 NOTE — BHH Group Notes (Signed)
[facility] LCSW Aftercare Discharge Planning Group Note   07/07/2014 10:33 AM    Participation Quality:  Appropraite  Mood/Affect:  Appropriate  Depression Rating:  8  Anxiety Rating:  8  Thoughts of Suicide:  No  Will you contract for safety?   NA  Current AVH:  No  Plan for Discharge/Comments:  Patient attended discharge planning group and actively participated in group.  She advised of being interested in residential treatment at discharge.  Transportation Means: Patient has transportation.   Supports:  Patient has a support system.   [name], [place] Post

## 2014-07-07 NOTE — Plan of Care (Signed)
Problem: Alteration in mood & ability to function due to Goal: STG-Patient will attend groups Outcome: Not Progressing Patient did not attend group this evening.

## 2014-07-07 NOTE — Progress Notes (Addendum)
D: Pt is currently denying any SI/HI/AVH. Pt denies any current physical symptoms at this time. Pt had no concerns or questions she wished for this writer to currently address. Pt was encouraged to participate in group this evening.  A: Writer informed pt that her medications would be available after 2130. Continued support and availability as needed was extended to this pt. Staff continue to monitor pt with q52min checks.  R: Pt did not report for her medications. Pt did not participate within the milieu this evening. Pt remains safe at this time.

## 2014-07-07 NOTE — Progress Notes (Signed)
Nutrition Brief Note  Malnutrition Screening Tool result is inaccurate.  Please consult if nutrition needs are identified.  Carlis Stable MS, [place], LDN [phone] Pager [phone] Weekend/After Hours Pager

## 2014-07-08 MED ORDER — FLUOXETINE HCL 20 MG PO CAPS
60.0000 mg | ORAL_CAPSULE | Freq: Every day | ORAL | Status: DC
  Administered 2014-07-09 – 2014-07-14 (×6): 60 mg via ORAL
  Filled 2014-07-08: qty 42
  Filled 2014-07-08 (×7): qty 3

## 2014-07-08 NOTE — Progress Notes (Signed)
The focus of this group is to educate the patient on the purpose and policies of crisis stabilization and provide a format to answer questions about their admission.  The group details unit policies and expectations of patients while admitted.  Patient did not attend group. 

## 2014-07-08 NOTE — Progress Notes (Signed)
[facility] MD Progress Note  07/08/2014 12:43 PM [name]  MRN:  [id number] Subjective:  Patient reports she still feels depressed, sad, and still feels tremulous. Objective: Patient presents with some improvement- she does remain tremulous, but to a lesser degree than before. She does not appear diaphoretic or restless, and at this time her vitals are stable. Her gait is slow and somewhat unsteady- she states she uses a cane at home and does have a history of falls. Due to this , will order a PT consult to assess need for cane /walker or other assistance as needed. She remains depressed, sad, and was briefly tearful today. Her affect is less blunted and today was somewhat more communicative. She denies any SI.  She tends to be isolative and milieu participation is limited. No agitated or self injurious behaviors on unit. Denies medication side effects Diagnosis:  Alcohol Dependence, Alcohol Withdrawal, Depression NOS, consider MDD versus substance induced   Total Time spent with patient: 25 minutes     ADL's:  Fair   Sleep: improved   Appetite:  Fair, but improving   Suicidal Ideation:  At this time denies suicidal ideations Homicidal Ideation:  At this time denies homicidal ideations AEB (as evidenced by):  Psychiatric Specialty Exam: Physical Exam  Review of Systems  Constitutional: Negative for fever and chills.  Respiratory: Negative for cough and shortness of breath.   Cardiovascular: Negative for chest pain.  Gastrointestinal: Negative for nausea, vomiting and abdominal pain.  Musculoskeletal: Negative.        Slow , slightly unsteady ambulation  Psychiatric/Behavioral: Positive for depression and substance abuse.    Blood pressure 140/90, pulse 82, temperature 97.6 F (36.4 C), temperature source Oral, resp. rate 20, height 5\' 7"  (1.702 m), weight 69.4 kg (153 lb), SpO2 98.00%.Body mass index is 23.96 kg/(m^2).  General Appearance: Fairly Groomed  Engineer, water::  Good   Speech:  Slow  Volume:  Decreased  Mood:  Depressed  Affect:  Constricted and Depressed  Thought Process:  Linear and but slow  Orientation:  Other:  alert and attentive, no current evidence of delirium  Thought Content:  reports she had seen some " spots" possibly related to withdrawal. At this time she has no current hallucinations. No delusions expressed   Suicidal Thoughts:  No- currently denies any suicidal or homicidal ideations/plans/intentions  Homicidal Thoughts:  No  Memory:  fair   Judgement:  Fair  Insight:  Fair  Psychomotor Activity:  Decreased  Concentration:  Fair  Recall:  Good  Fund of Knowledge:Good  Language: Fair  Akathisia:  Negative  Handed:  Right  AIMS (if indicated):     Assets:  Desire for Improvement Resilience  Sleep:  Number of Hours: 6.75   Musculoskeletal: Strength & Muscle Tone: within normal limits Gait & Station: slow, somehwat unsteady Patient leans: N/A  Current Medications: Current Facility-Administered Medications  Medication Dose Route Frequency Provider Last Rate Last Dose  . acetaminophen (TYLENOL) tablet 650 mg  650 mg Oral Q6H PRN Dara Hoyer, PA-C   650 mg at 07/07/14 1635  . alum & mag hydroxide-simeth (MAALOX/MYLANTA) 200-200-20 MG/5ML suspension 30 mL  30 mL Oral Q4H PRN Dara Hoyer, PA-C      . chlordiazePOXIDE (LIBRIUM) capsule 25 mg  25 mg Oral Q6H PRN Dara Hoyer, PA-C   25 mg at 07/07/14 1043  . chlordiazePOXIDE (LIBRIUM) capsule 25 mg  25 mg Oral TID Nicholaus Bloom, MD   25 mg at  07/08/14 1211   Followed by  . [START ON 07/09/2014] chlordiazePOXIDE (LIBRIUM) capsule 25 mg  25 mg Oral BH-qamhs Nicholaus Bloom, MD       Followed by  . [START ON 07/10/2014] chlordiazePOXIDE (LIBRIUM) capsule 25 mg  25 mg Oral Daily Nicholaus Bloom, MD      . feeding supplement (ENSURE COMPLETE) (ENSURE COMPLETE) liquid 237 mL  237 mL Oral BID BM Encarnacion Slates, NP   237 mL at 07/07/14 1637  . FLUoxetine (PROZAC) capsule 40 mg  40 mg  Oral Daily Dara Hoyer, PA-C   40 mg at 07/08/14 9798  . hydrOXYzine (ATARAX/VISTARIL) tablet 25 mg  25 mg Oral Q6H PRN Dara Hoyer, PA-C   25 mg at 07/07/14 1043  . loperamide (IMODIUM) capsule 2-4 mg  2-4 mg Oral PRN Dara Hoyer, PA-C   4 mg at 07/05/14 2150  . magnesium hydroxide (MILK OF MAGNESIA) suspension 30 mL  30 mL Oral Daily PRN Dara Hoyer, PA-C      . multivitamin with minerals tablet 1 tablet  1 tablet Oral Daily Dara Hoyer, PA-C   1 tablet at 07/08/14 9211  . nicotine (NICODERM CQ - dosed in mg/24 hours) patch 21 mg  21 mg Transdermal Q0600 Dara Hoyer, PA-C   21 mg at 07/07/14 0830  . ondansetron (ZOFRAN-ODT) disintegrating tablet 4 mg  4 mg Oral Q6H PRN Dara Hoyer, PA-C      . potassium chloride SA (K-DUR,KLOR-CON) CR tablet 20 mEq  20 mEq Oral BID Nicholaus Bloom, MD   20 mEq at 07/08/14 9417  . risperiDONE (RISPERDAL) tablet 1 mg  1 mg Oral QHS Dara Hoyer, PA-C   1 mg at 07/06/14 2157  . thiamine (B-1) injection 100 mg  100 mg Intramuscular Once Benjamine Mola, FNP      . thiamine (VITAMIN B-1) tablet 100 mg  100 mg Oral Daily Dara Hoyer, PA-C   100 mg at 07/08/14 4081  . traZODone (DESYREL) tablet 50 mg  50 mg Oral QHS PRN Dara Hoyer, PA-C   50 mg at 07/05/14 2150    Lab Results: No results found for this or any previous visit (from the past 48 hour(s)).  Physical Findings: AIMS: Facial and Oral Movements Muscles of Facial Expression: None, normal Lips and Perioral Area: Minimal Jaw: None, normal Tongue: None, normal,Extremity Movements Upper (arms, wrists, hands, fingers): Minimal Lower (legs, knees, ankles, toes): None, normal, Trunk Movements Neck, shoulders, hips: None, normal, Overall Severity Severity of abnormal movements (highest score from questions above): Minimal Incapacitation due to abnormal movements: None, normal Patient's awareness of abnormal movements (rate only patient's report): Aware, no distress, Dental  Status Current problems with teeth and/or dentures?: No Does patient usually wear dentures?: No  CIWA:  CIWA-Ar Total: 4 COWS:  COWS Total Score: 5   Assessment: Currently patient is partially improved, with stable vitals, and less tremulousness than yesterday. She remains depressed, sad, but is not suicidal , and currently does not present with psychotic symptoms. She is tolerating Librium taper well,  And is also on Prozac and low dose Risperidone , which she had been taking prior to admission Treatment Plan Summary: Daily contact with patient to assess and evaluate symptoms and progress in treatment Medication management See below  Plan: Continue inpatient treatment. Continue Librium detox protocol Continue to provide support, milieu Increase Prozac to 60 mgrs QDAY to address ongoing depressive symptoms.  Patient  agrees to titration. Continue Risperidone 1 mgr QHS. Will follow PT consult to assess gait and need for cane or walker. Medical Decision Making Problem Points:  Established problem, stable/improving (1), Review of last therapy session (1) and Review of psycho-social stressors (1) Data Points:  Review of medication regiment & side effects (2) Review of new medications or change in dosage (2)  I certify that inpatient services furnished can reasonably be expected to improve the patient's condition.   COBOS, [place] 07/08/2014, 12:43 PM

## 2014-07-08 NOTE — Progress Notes (Signed)
D: Patient denies SI/HI and A/V hallucinations; patient reports that she is sleeping off and on; patient reports the only withdrawal symptoms she is experiencing are tremors; patient denies pain  A: Monitored q 15 minutes; patient encouraged to attend groups; patient educated about medications; patient given medications per physician orders; patient encouraged to express feelings and/or concerns  R: Patient remained in the bed until 1100 hrs; patient having a lot tremors this morning; patient was doing a lot of shaking while consuming medications; patient is pleasant and is taking all medications

## 2014-07-08 NOTE — Tx Team (Signed)
Interdisciplinary Treatment Plan Update   Date Reviewed:  07/08/2014  Time Reviewed:  8:48 AM  Progress in Treatment:   Attending groups: Yes Participating in groups: Yes Taking medication as prescribed: Yes  Tolerating medication: Yes Family/Significant other contact made:  No, but will ask patient for consent for collateral contact Patient understands diagnosis: Yes  Discussing patient identified problems/goals with staff: Yes Medical problems stabilized or resolved: Yes Denies suicidal/homicidal ideation: Yes Patient has not harmed self or others: Yes  For review of initial/current patient goals, please see plan of care.  Estimated Length of Stay:    Reasons for Continued Hospitalization:  Anxiety Depression Medication stabilization Suicidal ideation  New Problems/Goals identified:    Discharge Plan or Barriers:   Referral made to [facility] for residential treatment  Additional Comments:  [name] is a 59 yo AAF who was initially at [facility] ED for etoh and cocaine abuse. She states she had not been feeling well and was feeling weak in the last 2 weeks. She was transferred to [facility] inpatient adult for further evaluation and treatment. [name] reports that she had been abusing alcohol, cocaine and cannabis off and on for several years. She also quantified 10 years that she has had depression wherein she takes prozac, respirdal and trazodone for insomnia. She was off her meds for 2 days before seeking treatment at [facility]. She denies SI/HI/AVH   Patient and CSW reviewed patient's identified goals and treatment plan.  Patient verbalized understanding and agreed to treatment plan.   Attendees:  Patient:  07/08/2014 8:48 AM   Signature:  Gabriel Earing, MD 07/08/2014 8:48 AM  Signature: Carlton Adam, MD 07/08/2014 8:48 AM  Signature:   07/08/2014 8:48 AM  Signature:  Grayland Ormond, RN 07/08/2014 8:48 AM  Signature:   07/08/2014 8:48 AM  Signature:  Joette Catching, LCSW 07/08/2014 8:48 AM   Signature:  Erasmo Downer Drinkard, LCSW-A 07/08/2014 8:48 AM  Signature:  Lucinda Dell, Care Coordinator [facility] 07/08/2014 8:48 AM  Signature:   07/08/2014 8:48 AM  Signature: Marilynne Halsted, RN 07/08/2014  8:48 AM  Signature:   Lars Pinks, RN [facility] 07/08/2014  8:48 AM  Signature:  Hooper Worker LCSW 07/08/2014  8:48 AM    Scribe for Treatment Team:   Joette Catching,  07/08/2014 8:48 AM

## 2014-07-08 NOTE — BHH Group Notes (Signed)
[place] LCSW Group Therapy      Feelings About Diagnosis 1:15 - 2:30 PM         07/08/2014    Type of Therapy:  Group Therapy  Participation Level:  Did not attend group - not feeling well.  Concha Pyo 07/08/2014

## 2014-07-08 NOTE — Progress Notes (Signed)
Adult Psychoeducational Group Note  Date:  07/08/2014 Time:  9:14 PM  Group Topic/Focus:  Wrap-Up Group:   The focus of this group is to help patients review their daily goal of treatment and discuss progress on daily workbooks.  Participation Level:  Did Not Attend  Participation Quality:  Drowsy  Affect:  Flat  Cognitive:  Lacking  Insight: None  Engagement in Group:  None  Modes of Intervention:  None  Additional Comments:  Pt did not attend group   Marya Lowden, [place] 07/08/2014, 9:14 PM

## 2014-07-08 NOTE — Progress Notes (Signed)
D: Pt in bed resting at the beginning of shift. Pt is denying any physical symptoms at this time. Pt reports that she is feeling okay for the moment. Pt is currently denying any SI/HI/AVH.  A: Continued support and availability as needed was extended to this pt. Staff continue to monitor pt with q60min checks.  R: No adverse drug reactions noted. Pt receptive to treatment. Pt remains safe at this time.

## 2014-07-08 NOTE — Progress Notes (Signed)
Pt has been in bed since shift began at 1900.  Pt woke enough to deny SI/HI/AV.  Pt says she has been very tired this evening and just wants to sleep.  Pt still reporting tremors from ETOH withdrawals, but no other symptoms.  Pt does not have any other complaints at this time.  Pt took her hs Risperdal at this time and asked for her Trazodone.  Encouraged pt to hold off on the trazodone as she has already been sleepy, and let staff know in the night if she has trouble getting back to sleep if she wakes up later.  Pt in agreement.  Pt makes her needs known to staff.  Pt is unsure of her plans after detox.  Support and encouragement offered.  Safety maintained with q15 minute checks.

## 2014-07-09 LAB — BASIC METABOLIC PANEL
Anion gap: 12 (ref 5–15)
BUN: 11 mg/dL (ref 6–23)
CHLORIDE: 104 meq/L (ref 96–112)
CO2: 27 mEq/L (ref 19–32)
Calcium: 9.4 mg/dL (ref 8.4–10.5)
Creatinine, Ser: 1.02 mg/dL (ref 0.50–1.10)
GFR calc Af Amer: 69 mL/min — ABNORMAL LOW (ref >90)
GFR calc non Af Amer: 59 mL/min — ABNORMAL LOW (ref >90)
Glucose, Bld: 103 mg/dL — ABNORMAL HIGH (ref 70–99)
Potassium: 4.1 mEq/L (ref 3.7–5.3)
SODIUM: 143 meq/L (ref 137–147)

## 2014-07-09 MED ORDER — ACAMPROSATE CALCIUM 333 MG PO TBEC
666.0000 mg | DELAYED_RELEASE_TABLET | Freq: Three times a day (TID) | ORAL | Status: DC
  Administered 2014-07-09 – 2014-07-14 (×16): 666 mg via ORAL
  Filled 2014-07-09: qty 2
  Filled 2014-07-09: qty 84
  Filled 2014-07-09 (×5): qty 2
  Filled 2014-07-09 (×2): qty 84
  Filled 2014-07-09 (×13): qty 2

## 2014-07-09 NOTE — Progress Notes (Signed)
D: Pt denies any SI/HI/AVH. Pt attended NA/AA this evening. Pt presents with tremors with this evening. Pt denies any other withdraw symptoms.  A: Writer administered scheduled and prn medications to pt, per MD orders. Continued support and availability as needed was extended to this pt. Staff continue to monitor pt with q76min checks.  R: No adverse drug reactions noted. Pt receptive to treatment. Pt remains safe at this time.

## 2014-07-09 NOTE — BHH Group Notes (Signed)
[place] LCSW Group Therapy  Emotional Regulation 1:15 - 2: 30 PM        07/09/2014     Type of Therapy:  Group Therapy  Participation Level:  Did not attend group - no reason given.   Concha Pyo 07/09/2014

## 2014-07-09 NOTE — Progress Notes (Signed)
Patient appeared to be very tired and week this morning. Writer offered patient a cup of Gatorade and encouraged he to wait in her room; notified tech on the hall to bring back patient tray for her.  She denied withdrawal symptoms.
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
## 2014-07-09 NOTE — Progress Notes (Signed)
Patient resting quietly with eyes closed. Respirations even and unlabored. No distress noted . Q 15 minute check continues as ordered to maintain safety. 

## 2014-07-09 NOTE — Progress Notes (Signed)
D: Patient denies SI/HI and A/V hallucinations; patient reports the tremors and lower back pain   A: Monitored q 15 minutes; patient encouraged to attend groups; patient educated about medications; patient given medications per physician orders; patient encouraged to express feelings and/or concerns  R: Patient has remained in the bed during the groups and reports that she is tired and wants to stay in the bed; patient is taking medications and tolerating medications

## 2014-07-09 NOTE — BHH Group Notes (Signed)
[facility] LCSW Aftercare Discharge Planning Group Note   07/09/2014 10:36 AM    Participation Quality:  Appropraite  Mood/Affect:  Appropriate  Depression Rating:  10  Anxiety Rating:  10  Thoughts of Suicide:  No  Will you contract for safety?   NA  Current AVH:  No  Plan for Discharge/Comments:  Patient attended discharge planning group and actively participated in group.  Patient advised of being a [place] better today.  She denies SI/HI but stated she continues to have some problem (shaking) with detox.  She was informed that discharge clinicals were sent to [facility] as requested and they are reviewing for admission and bed availability.  CSW provided all participants with daily workbook.   Transportation Means: Patient has transportation.   Supports:  Patient has a support system.   Sekou Zuckerman, Eulas Post

## 2014-07-09 NOTE — Evaluation (Signed)
Physical Therapy Evaluation Patient Details Name: [name] MRN: [id number] DOB: [birth year] Today's Date: 07/09/2014   History of Present Illness  Substance induced mood disorder  Clinical Impression  Pt presenting with gait instability and at risk for falling but largely corrected with use of RW.  Pt will benefit from PT intervention to address ambulatory balance deficits.    Follow Up Recommendations Home health PT    Equipment Recommendations  Rolling walker with 5" wheels    Recommendations for Other Services       Precautions / Restrictions Precautions Precautions: Fall Restrictions Weight Bearing Restrictions: No      Mobility  Bed Mobility                  Transfers Overall transfer level: Needs assistance Equipment used: Rolling walker (2 wheeled) Transfers: Sit to/from Stand Sit to Stand: Min guard         General transfer comment: cues for appropriate/safe transition position and use of UEs to self assist  Ambulation/Gait Ambulation/Gait assistance: Min assist;Supervision Ambulation Distance (Feet): 200 Feet Assistive device: Rolling walker (2 wheeled);Straight cane;None Gait Pattern/deviations: Step-to pattern;Decreased step length - right;Decreased step length - left;Shuffle;Trunk flexed;Wide base of support;Antalgic Gait velocity: decr   General Gait Details: Pt ambulating in hall utilizing rails for stability, partially corrected with use of SPC and largely corrected with use of RW  Stairs            Wheelchair Mobility    Modified Rankin (Stroke Patients Only)       Balance Overall balance assessment: Needs assistance Sitting-balance support: No upper extremity supported Sitting balance-Leahy Scale: Normal     Standing balance support: No upper extremity supported Standing balance-Leahy Scale: Fair Standing balance comment: Pt able to stand and maintain static balance without support but with deterioration noted at  initiation of movement                             Pertinent Vitals/Pain Pain Assessment: 0-10 Pain Score: 4  Pain Location: Back, L knee, L shoulder, L foot.... Pain Descriptors / Indicators: Sore Pain Intervention(s): Limited activity within patient's tolerance    Home Living Family/patient expects to be discharged to:: Private residence Living Arrangements: Alone   Type of Home: Apartment Home Access: Level entry     Home Layout: One level Home Equipment: None      Prior Function Level of Independence: Independent               Hand Dominance        Extremity/Trunk Assessment   Upper Extremity Assessment: LUE deficits/detail RUE Deficits / Details: Tremors Bil UEs (R>L)     LUE Deficits / Details: Shoulder elevation ltd to 90 degrees 2* discomfort   Lower Extremity Assessment: RLE deficits/detail;LLE deficits/detail;Generalized weakness         Communication   Communication: No difficulties  Cognition Arousal/Alertness: Awake/alert Behavior During Therapy: WFL for tasks assessed/performed Overall Cognitive Status: Within Functional Limits for tasks assessed                      General Comments      Exercises        Assessment/Plan    PT Assessment Patient needs continued PT services  PT Diagnosis Difficulty walking   PT Problem List Decreased strength;Decreased range of motion;Decreased activity tolerance;Decreased mobility;Decreased knowledge of use of DME;Decreased knowledge of precautions;Pain;Obesity;Decreased safety awareness;Decreased balance  PT Treatment Interventions DME instruction;Gait training;Functional mobility training;Therapeutic activities;Therapeutic exercise;Balance training;Patient/family education   PT Goals (Current goals can be found in the Care Plan section) Acute Rehab PT Goals Patient Stated Goal: Walk better PT Goal Formulation: With patient Time For Goal Achievement: 07/23/14 Potential to  Achieve Goals: Fair    Frequency Min 2X/week   Barriers to discharge        Co-evaluation               End of Session Equipment Utilized During Treatment: Gait belt Activity Tolerance: Patient tolerated treatment well Patient left: in chair;with call bell/phone within reach Nurse Communication: Mobility status    Functional Assessment Tool Used: clinical judgement Functional Limitation: Mobility: Walking and moving around Mobility: Walking and Moving Around Current Status [phone]): At least 20 percent but less than 40 percent impaired, limited or restricted Mobility: Walking and Moving Around Goal Status [phone]): At least 1 percent but less than 20 percent impaired, limited or restricted    Time: 1110-1142 PT Time Calculation (min): 32 min   Charges:   PT Evaluation $Initial PT Evaluation Tier I: 1 Procedure PT Treatments $Gait Training: 8-22 mins   PT G Codes:   Functional Assessment Tool Used: clinical judgement Functional Limitation: Mobility: Walking and moving around    [facility] 07/09/2014, 1:11 PM

## 2014-07-09 NOTE — ED Provider Notes (Signed)
Medical screening examination/treatment/procedure(s) were performed by non-physician practitioner and as supervising physician I was immediately available for consultation/collaboration.   EKG Interpretation   Date/Time:  Saturday July 05 2014 15:37:37 EDT Ventricular Rate:  82 PR Interval:  146 QRS Duration: 91 QT Interval:  418 QTC Calculation: 488 R Axis:   -12 Text Interpretation:  Sinus rhythm Ventricular premature complex Abnormal  R-wave progression, early transition Minimal ST depression, lateral leads  Borderline prolonged QT interval Baseline wander Artifact When compared  with ECG of 02/19/2014 QT has lengthened Otherwise no significant change  Confirmed by [facility]  MD, Graciemae Delisle ([id number]) on 07/05/2014 3:42:41 PM        Francine Graven, DO 07/09/14 [id number]

## 2014-07-10 MED ORDER — MIRTAZAPINE 15 MG PO TABS
15.0000 mg | ORAL_TABLET | Freq: Every day | ORAL | Status: DC
  Administered 2014-07-10 – 2014-07-13 (×4): 15 mg via ORAL
  Filled 2014-07-10 (×3): qty 1
  Filled 2014-07-10: qty 14
  Filled 2014-07-10 (×2): qty 1

## 2014-07-10 NOTE — Progress Notes (Signed)
Patient ID: [name], female   DOB: Mar 23, 1956, 58 y.o.   MRN: [id number] [facility] MD Progress Note  07/10/2014 1:22 PM [name]  MRN:  [id number] Subjective:  Patient is feeling less depressed. She is currently focused on getting into a Rehab program after discharge [facility]) Objective:  Still presents with some depression and a tendency towards constricted affect but is clearly improved compared to admission, and is smiling at times appropriately. She is also now future oriented, and states she had a phone interview with [facility] staff earlier today and she is hoping to be accepted. As per SW note, a bed may become available tomorrow. Thus far she is tolerating medications well, to include Campral, which was recently added to address alcohol cravings. She is more visible on unit, but still tends to be quiet and passive in milieu. We discussed options to address residual depression and anxiety. Patient also states she has frequent insomnia. She agreed to [facility] trial, and we discussed side effects and rationale.. Mobilizing with walker with no difficulty. No falls. Diagnosis:  Alcohol Dependence, Alcohol Withdrawal, Depression NOS, consider MDD versus substance induced   Total Time spent with patient: 25 minutes     ADL's:  Improved   Sleep: improved   Appetite:  Fair, but improving   Suicidal Ideation:  At this time denies suicidal ideations Homicidal Ideation:  At this time denies homicidal ideations AEB (as evidenced by):  Psychiatric Specialty Exam: Physical Exam  Review of Systems  Constitutional: Negative for fever and chills.  Respiratory: Negative for cough and shortness of breath.   Cardiovascular: Negative for chest pain.  Gastrointestinal: Negative for nausea, vomiting and abdominal pain.  Musculoskeletal: Negative.        Slow , slightly unsteady ambulation  Psychiatric/Behavioral: Positive for depression and substance abuse.    Blood pressure 99/56, pulse  99, temperature 98 F (36.7 C), temperature source Oral, resp. rate 16, height _0  (1.702 m), weight 69.4 kg (153 lb), SpO2 100.00%.Body mass index is 23.96 kg/(m^2).  General Appearance: improved grooming   Eye Contact::  Good  Speech:  Slow  Volume:  Decreased  Mood:  Remains depressed , but improving gradually  Affect:  Constricted and does smile briefly at times, less tearful  Thought Process:  Linear and but slow  Orientation:  Other:  alert and attentive, no current evidence of delirium  Thought Content:  denies hallucinations, no delusions  Suicidal Thoughts:  No- currently denies any suicidal or homicidal ideations/plans/intentions  Homicidal Thoughts:  No  Memory:  fair   Judgement:  Fair  Insight:  Fair  Psychomotor Activity: improving   Concentration:  Fair  Recall:  Good  Fund of Knowledge:Good  Language: Fair  Akathisia:  Negative  Handed:  Right  AIMS (if indicated):     Assets:  Desire for Improvement Resilience  Sleep:  Number of Hours: 6.25   Musculoskeletal: Strength & Muscle Tone: within normal limits Gait & Station: slow, somehwat unsteady Patient leans: N/A  Current Medications: Current Facility-Administered Medications  Medication Dose Route Frequency Provider Last Rate Last Dose  . acamprosate (CAMPRAL) tablet 666 mg  666 mg Oral TID WC Neita Garnet, MD   666 mg at 07/10/14 1201  . acetaminophen (TYLENOL) tablet 650 mg  650 mg Oral Q6H PRN Dara Hoyer, PA-C   650 mg at 07/09/14 1036  . alum & mag hydroxide-simeth (MAALOX/MYLANTA) 200-200-20 MG/5ML suspension 30 mL  30 mL Oral Q4H PRN Dara Hoyer,  PA-C      . feeding supplement (ENSURE COMPLETE) (ENSURE COMPLETE) liquid 237 mL  237 mL Oral BID BM Encarnacion Slates, NP   237 mL at 07/09/14 1319  . FLUoxetine (PROZAC) capsule 60 mg  60 mg Oral Daily Neita Garnet, MD   60 mg at 07/10/14 0829  . hydrOXYzine (ATARAX/VISTARIL) tablet 25 mg  25 mg Oral Q6H PRN Dara Hoyer, PA-C   25 mg at 07/07/14  1043  . magnesium hydroxide (MILK OF MAGNESIA) suspension 30 mL  30 mL Oral Daily PRN Dara Hoyer, PA-C   30 mL at 07/09/14 1037  . multivitamin with minerals tablet 1 tablet  1 tablet Oral Daily Dara Hoyer, PA-C   1 tablet at 07/10/14 1025  . nicotine (NICODERM CQ - dosed in mg/24 hours) patch 21 mg  21 mg Transdermal Q0600 Dara Hoyer, PA-C   21 mg at 07/10/14 8527  . potassium chloride SA (K-DUR,KLOR-CON) CR tablet 20 mEq  20 mEq Oral BID Nicholaus Bloom, MD   20 mEq at 07/10/14 0829  . risperiDONE (RISPERDAL) tablet 1 mg  1 mg Oral QHS Dara Hoyer, PA-C   1 mg at 07/09/14 2137  . thiamine (B-1) injection 100 mg  100 mg Intramuscular Once Benjamine Mola, FNP      . thiamine (VITAMIN B-1) tablet 100 mg  100 mg Oral Daily Dara Hoyer, PA-C   100 mg at 07/10/14 7824  . traZODone (DESYREL) tablet 50 mg  50 mg Oral QHS PRN Dara Hoyer, PA-C   50 mg at 07/09/14 2137    Lab Results:  Results for orders placed during the hospital encounter of 07/05/14 (from the past 48 hour(s))  BASIC METABOLIC PANEL     Status: Abnormal   Collection Time    07/09/14  5:25 AM      Result Value Ref Range   Sodium 143  137 - 147 mEq/L   Potassium 4.1  3.7 - 5.3 mEq/L   Chloride 104  96 - 112 mEq/L   CO2 27  19 - 32 mEq/L   Glucose, Bld 103 (*) 70 - 99 mg/dL   BUN 11  6 - 23 mg/dL   Creatinine, Ser 1.02  0.50 - 1.10 mg/dL   Calcium 9.4  8.4 - 10.5 mg/dL   GFR calc non Af Amer 59 (*) >90 mL/min   GFR calc Af Amer 69 (*) >90 mL/min   Comment: (NOTE)     The eGFR has been calculated using the CKD EPI equation.     This calculation has not been validated in all clinical situations.     eGFR's persistently <90 mL/min signify possible Chronic Kidney     Disease.   Anion gap 12  5 - 15   Comment: Performed at [facility]    Physical Findings: AIMS: Facial and Oral Movements Muscles of Facial Expression: None, normal Lips and Perioral Area: Minimal Jaw: None,  normal Tongue: None, normal,Extremity Movements Upper (arms, wrists, hands, fingers): Minimal Lower (legs, knees, ankles, toes): None, normal, Trunk Movements Neck, shoulders, hips: None, normal, Overall Severity Severity of abnormal movements (highest score from questions above): Minimal Incapacitation due to abnormal movements: None, normal Patient's awareness of abnormal movements (rate only patient's report): Aware, no distress, Dental Status Current problems with teeth and/or dentures?: No Does patient usually wear dentures?: No  CIWA:  CIWA-Ar Total: 2 COWS:  COWS Total Score: 5   Assessment:  Patient continues to gradually improved, with less severe depression, although she does continue to report a sense of sadness and some anxiety symptoms. She is not suicidal.  She is presenting with mild tremors but otherwise has no current active alcohol withdrawal symptoms. She is completing Librium detox protocol today. She is interested in going to an inpatient rehab.  Treatment Plan Summary: Daily contact with patient to assess and evaluate symptoms and progress in treatment Medication management See below  Plan: Continue inpatient treatment.l Continue to provide support, milieu Prozac  60 mgrs QDAY  Risperidone 1 mgr QHS. Campral 666 mgrs TID Start Remeron 15 mgrs QHS to address depression and insomnia. D/C Trazodone Will follow  Medical Decision Making Problem Points:  Established problem, stable/improving (1), Review of last therapy session (1) and Review of psycho-social stressors (1) Data Points:  Review or order clinical lab tests (1) Review of medication regiment & side effects (2) Review of new medications or change in dosage (2)  I certify that inpatient services furnished can reasonably be expected to improve the patient's condition.   Talik Casique 07/10/2014, 1:22 PM

## 2014-07-10 NOTE — Progress Notes (Signed)
Patient ID: [name], female   DOB: 01-18-1956, 58 y.o.   MRN: [id number]  D: Patient pleasant on approach this am. Reports mood improved and currently denies any SI this am. Currently trying to get into long term treatment program. Taking all medications without issue. Reports medications making her tired today. A: Staff will monitor on q 15 minute checks, follow treatment plan, and give meds as ordered. R: Cooperative on the unit.

## 2014-07-10 NOTE — Progress Notes (Signed)
Patient ID: [name], female   DOB: 1956-09-12, 58 y.o.   MRN: [id number] PER STATE REGULATIONS 482.30  THIS CHART WAS REVIEWED FOR MEDICAL NECESSITY WITH RESPECT TO THE PATIENT'S ADMISSION/ DURATION OF STAY.  NEXT REVIEW DATE: 07/13/2014  Chauncy Lean, RN, BSN CASE MANAGER

## 2014-07-10 NOTE — BHH Group Notes (Signed)
[place] LCSW Group Therapy 07/10/2014  1:15 pm   Type of Therapy: Group Therapy Participation Level: Active  Participation Quality: Attentive, Sharing and Supportive  Affect: Depressed and Flat  Cognitive: Alert and Oriented  Insight: Developing/Improving and Engaged  Engagement in Therapy: Developing/Improving and Engaged  Modes of Intervention: Clarification, Confrontation, Discussion, Education, Exploration, Limit-setting, Orientation, Problem-solving, Rapport Building, Art therapist, Socialization and Support  Summary of Progress/Problems: The topic for group was balance in life. Today's group focused on defining balance in one's own words, identifying things that can knock one off balance, and exploring healthy ways to maintain balance in life. Group members were asked to provide an example of a time when they felt off balance, describe how they handled that situation,and process healthier ways to regain balance in the future. Group members were asked to share the most important tool for maintaining balance that they learned while at [facility] and how they plan to apply this method after discharge. Patient actively listened during group but stated that she did not want to share on topic today.  Tilden Fossa, MSW, [place] Worker [facility] [phone]

## 2014-07-10 NOTE — Clinical Social Work Note (Addendum)
CSW spoke with Melissa at [facility].  She advised they will have a bed for patient on Friday, September 18.  Patient given Melissa's phone number to complete interview.  MD advised.  CSW spoke with [place] following phone interview with patient.  She advised she will hold a bed for patient until Monday if she is not stable for discharge tomorrow.  She questions if patient would be on a walker as she will need to be able to go up and down stairs.  CSW to follow up with MD on this concern.

## 2014-07-10 NOTE — Progress Notes (Signed)
Pt attended karaoke group this evening.  

## 2014-07-10 NOTE — BHH Group Notes (Signed)
[place] Group Notes:  (Nursing/MHT/Case Management/Adjunct)  Date:  07/10/2014  Time:  0930  Type of Therapy:  Nurse Education  Participation Level:  Active  Participation Quality:  Appropriate  Affect:  Appropriate  Cognitive:  Appropriate  Insight:  Appropriate  Engagement in Group:  Engaged  Modes of Intervention:  Clarification and Support  Summary of Progress/Problems: Morning wellness  Franciso Bend 07/10/2014, 3:16 PM

## 2014-07-11 NOTE — BHH Group Notes (Signed)
[place] LCSW Group Therapy  Feelings Around Relapse 1:15 -2:30        07/11/2014   Type of Therapy:  Group Therapy  Participation Level:  Patient did not attend group due to not feeling well.   Concha Pyo 07/11/2014

## 2014-07-11 NOTE — BHH Group Notes (Signed)
Adult Psychoeducational Group Note  Date:  07/11/2014 Time:  9:55 PM  Group Topic/Focus:  AA Meeting  Participation Level:  Did Not Attend  Participation Quality:  None  Affect:  None  Cognitive:  None  Insight: None  Engagement in Group:  None  Modes of Intervention:  Discussion and Education  Additional Comments:  Benedicta did not attend group.  Victorino Sparrow A 07/11/2014, 9:55 PM

## 2014-07-11 NOTE — Progress Notes (Deleted)
D) Pt has been attending the program. Sits in the dayroom and is quietly interacting on and off. Pt. Is limited in her understand and processing. She is very gently and kind. Was educated on her medication and provided with a 1:1. A) Given support, reassurance and praise. Encouragement given. Provided with a 1:1. R) Denies SI and HI. Denies hallucinations, delusions SI or HI. Rates her depression at a 1 her hopelessness and helplessness both at a 0. Pt states that her goal today is to work on her pleasure and to be happy. Also that she will do whatever it takes to be happy.

## 2014-07-11 NOTE — Progress Notes (Signed)
D. Pt has been visible in milieu, minimal interaction or participation in various activities. Pt reports that she is feeling better but spoke about feeling tired. Pt appears depressed on the unit, pt did speak about going to [facility] on Monday. Pt has received medications without incident and denies SI/HI. A. Support and encouragement provided. R. Safety maintained, will continue to monitor.

## 2014-07-11 NOTE — BHH Group Notes (Signed)
[facility] LCSW Aftercare Discharge Planning Group Note   07/11/2014 8:41 AM    Participation Quality:  Appropraite  Mood/Affect:  Appropriate  Depression Rating:  6  Anxiety Rating:  6  Thoughts of Suicide:  No  Will you contract for safety?   NA  Current AVH:  No  Plan for Discharge/Comments:  Patient attended discharge planning group and actively participated in group.  She will follow up with [facility] for residential treatment.  CSW provided all participants with daily workbook.   Transportation Means: Patient has transportation.   Supports:  Patient has a support system.   Marc Sivertsen, [place] Post

## 2014-07-11 NOTE — Progress Notes (Signed)
D: Pt presents flat in affect and depressed in mood. Pt forwards a [place] in conversation with this Probation officer.  There were no tremors observed from this pt. Pt denies any current withdrawal symptoms. Pt attended karaoke this evening. Pt observed talking to her roommate during the majority of the shift. Pt had limited interaction with others. Pt requested a prn Trazodone. Pt was informed that this order was discontinued. Writer further informed pt that her current order of Remeron was for insomnia and her MDD dx.   A: Writer administered scheduled medications to pt. Continued support and availability as needed was extended to this pt. Staff continue to monitor pt with q17min checks.  R: No adverse drug reactions noted. Pt receptive to treatment. Pt remains safe at this time.

## 2014-07-11 NOTE — Tx Team (Signed)
Interdisciplinary Treatment Plan Update   Date Reviewed:  07/11/2014  Time Reviewed:  8:39 AM  Progress in Treatment:   Attending groups: Yes Participating in groups: Yes, attends group but not easily engaged. Taking medication as prescribed: Yes  Tolerating medication: Yes Family/Significant other contact made:  No, patient delcinedcollateral contact Patient understands diagnosis: Yes, patient understands diagnosis and able to state goals for treatment Discussing patient identified problems/goals with staff: Yes Medical problems stabilized or resolved: Yes Denies suicidal/homicidal ideation: Yes Patient has not harmed self or others: Yes  For review of initial/current patient goals, please see plan of care.  Estimated Length of Stay:  3-4 days  Reasons for Continued Hospitalization:  Anxiety Depression Medication stabilization   New Problems/Goals identified:    Discharge Plan or Barriers:   Patient accepted for treatment at [facility] at discharge.  Additional Comments:    Patient and CSW reviewed patient's identified goals and treatment plan.  Patient verbalized understanding and agreed to treatment plan.   Attendees:  Patient:  07/11/2014 8:39 AM   Signature:  Gabriel Earing, MD 07/11/2014 8:39 AM  Signature: Carlton Adam, MD 07/11/2014 8:39 AM  Signature:  Drake Leach, RN 07/11/2014 8:39 AM  Signature:  Talbert Cage, RN 07/11/2014 8:39 AM  Signature:   07/11/2014 8:39 AM  Signature:  Joette Catching, LCSW 07/11/2014 8:39 AM  Signature:  Erasmo Downer Drinkard, LCSW-A 07/11/2014 8:39 AM  Signature:  Lucinda Dell, Care Coordinator [facility] 07/11/2014 8:39 AM  Signature:   07/11/2014 8:39 AM  Signature:  07/11/2014  8:39 AM  Signature:   Lars Pinks, RN [facility] 07/11/2014  8:39 AM  Signature:  [place] Worker LCSW 07/11/2014  8:39 AM    Scribe for Treatment Team:   Joette Catching,  07/11/2014 8:39 AM
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
## 2014-07-11 NOTE — Progress Notes (Signed)
D) Pt has been up in the milieu and interacts minimally with her peers. Pt is limited in her responses. Affect is flat and mood depressed. Rates her depression at a 7 and her hopelessness at a 6 and her anxiety at a 9. Denies SI and HI. States the one thing that she can do is pray. A) Given support, reassurance and praise. Encouragement given. Limited in her interacting. R) Denies SI and HI.

## 2014-07-12 DIAGNOSIS — F1020 Alcohol dependence, uncomplicated: Principal | ICD-10-CM

## 2014-07-12 DIAGNOSIS — F10939 Alcohol use, unspecified with withdrawal, unspecified: Secondary | ICD-10-CM

## 2014-07-12 DIAGNOSIS — F10239 Alcohol dependence with withdrawal, unspecified: Secondary | ICD-10-CM

## 2014-07-12 DIAGNOSIS — F3289 Other specified depressive episodes: Secondary | ICD-10-CM

## 2014-07-12 DIAGNOSIS — F329 Major depressive disorder, single episode, unspecified: Secondary | ICD-10-CM

## 2014-07-12 LAB — FOLATE: FOLATE: 14.4 ng/mL

## 2014-07-12 LAB — VITAMIN B12: Vitamin B-12: 334 pg/mL (ref 211–911)

## 2014-07-12 LAB — TSH: TSH: 1.06 u[IU]/mL (ref 0.350–4.500)

## 2014-07-12 NOTE — BHH Group Notes (Signed)
[facility] Group Notes:  (Clinical Social Work)  07/12/2014     10-11AM  Summary of Progress/Problems:   The main focus of today's process group was to learn how to use a decisional balance exercise to move forward in the Stages of Change, which were described and discussed.  Motivational Interviewing and a worksheet were utilized to help patients explore in depth the perceived benefits and costs of a self-sabotaging behavior, as well as the  benefits and costs of replacing that with a healthy coping mechanism.   The patient was only present for approximately the last 15 minutes of group.  She did not contribute to the discussion, but appeared to be listening, although not entirely engaged.  Her affect was blunted and she appeared depressed.  Type of Therapy:  Group Therapy - Process   Participation Level:  Minimal  Participation Quality:  Resistant  Affect:  Depressed and Flat  Cognitive:  Oriented  Insight:  Limited  Engagement in Therapy:  Limited  Modes of Intervention:  Education, Motivational Interviewing  Selmer Dominion, LCSW 07/12/2014, 1:06 PM

## 2014-07-12 NOTE — Progress Notes (Signed)
Did not attended group 

## 2014-07-12 NOTE — Progress Notes (Signed)
Writer spoke with patient 1:1 and she reports having had a good day. She reports that she plans to attend [facility] once discharged and reports that her discharge is scheduled for Monday. She did not attend group this evening but reports that she has attended groups earlier today. She is very soft spoken and has a flat affect. Writer informed her of hs meds and she is agreeable to taking her meds. Support and encouragement given, safety maintained on unit with 15 min checks.

## 2014-07-12 NOTE — Progress Notes (Signed)
D) Pt has been in bed most of the day. Has been informed that the groups were on and Pt has chosen to just go to the meals and not to the groups. . States "I am too tired to get up and go to group". Pt. Rates her depression, hopelessness and anxiety all at a 5. Denies SI and HI. States she sleeps a lot at home too because "there is nothing else to do" A) Given encouragement to get out of the bed and come to group. Therapeutic humor used with Pt. R) Pt states she feels too tired all the time and doesn't really want to go to the groups.

## 2014-07-12 NOTE — BHH Group Notes (Signed)
[facility] Group Notes:  Healthy Coping Skills  Date:  07/12/2014  Time:  2:47 PM  Type of Therapy:  Nurse Education  Participation Level:  Did Not Attend  Participation Quality:  Inattentive  Affect:  Flat  Cognitive:  Lacking  Insight:  None  Engagement in Group:  None  Modes of Intervention:  Discussion  Summary of Progress/Problems:Pt did not attend group  [facility] 07/12/2014, 2:47 PM

## 2014-07-12 NOTE — Progress Notes (Signed)
[facility] MD Progress Note  07/12/2014 12:28 PM [name]  MRN:  [id number] Subjective:  I am feeling tired.    Objective:  Patient seen chart reviewed.  Patient continued to endorse feeling tired however she did not feel it is medication related.  She endorse history of trauma in the past.  She still have mild tremors but overall it is improving.  She had missed a few groups because she was feeling tired.  Her sleep is on and off.  She admitted rumination and intrusive thoughts however she denies any suicidal thoughts or homicidal thoughts.  She does not want to change her medication.  Patient denies any hallucinations or any paranoia.  She responds well to empathy and support.  Diagnosis:  Alcohol Dependence, Alcohol Withdrawal, Depression NOS, consider MDD versus substance induced   Total Time spent with patient: 20 minutes    ADL's:  Improved   Sleep: Unchanged from the past.     Appetite: improved   Suicidal Ideation:  At this time denies suicidal ideations Homicidal Ideation:  At this time denies homicidal ideations AEB (as evidenced by):  Psychiatric Specialty Exam: Physical Exam  Review of Systems  Constitutional: Positive for malaise/fatigue. Negative for fever, chills and weight loss.       States she feels chronically tired, fatigued   Respiratory: Negative for cough and shortness of breath.   Cardiovascular: Negative for chest pain.  Gastrointestinal: Negative for nausea, vomiting and abdominal pain.  Genitourinary: Negative for dysuria, urgency and frequency.  Musculoskeletal: Negative.        Slow , slightly unsteady ambulation  Psychiatric/Behavioral: Positive for depression and substance abuse.    Blood pressure 118/90, pulse 94, temperature 97.7 F (36.5 C), temperature source Oral, resp. rate 18, height 5\' 7"  (1.702 m), weight 153 lb (69.4 kg), SpO2 100.00%.Body mass index is 23.96 kg/(m^2).  General Appearance: Guarded    Eye Contact::  Fair  Speech:   Slow  Volume:  Decreased  Mood:  Depression slowly improving  Affect:  Constricted but more reactive   Thought Process:  Linear and but slow  Orientation:  Other:  alert and attentive, no current evidence of delirium  Thought Content:  denies hallucinations, no delusions  Suicidal Thoughts:  No- currently denies any suicidal or homicidal ideations/plans/intentions  Homicidal Thoughts:  No  Memory:  fair   Judgement:  Fair  Insight:  Fair  Psychomotor Activity: improving   Concentration:  Fair  Recall:  Good  Fund of Knowledge:Good  Language: Fair  Akathisia:  Negative  Handed:  Right  AIMS (if indicated):     Assets:  Desire for Improvement Resilience  Sleep:  Number of Hours: 6.75   Musculoskeletal: Strength & Muscle Tone: within normal limits Gait & Station: slow, somehwat unsteady Patient leans: N/A  Current Medications: Current Facility-Administered Medications  Medication Dose Route Frequency Provider Last Rate Last Dose  . acamprosate (CAMPRAL) tablet 666 mg  666 mg Oral TID WC Neita Garnet, MD   666 mg at 07/12/14 0700  . acetaminophen (TYLENOL) tablet 650 mg  650 mg Oral Q6H PRN Dara Hoyer, PA-C   650 mg at 07/09/14 1036  . alum & mag hydroxide-simeth (MAALOX/MYLANTA) 200-200-20 MG/5ML suspension 30 mL  30 mL Oral Q4H PRN Dara Hoyer, PA-C      . feeding supplement (ENSURE COMPLETE) (ENSURE COMPLETE) liquid 237 mL  237 mL Oral BID BM Encarnacion Slates, NP   237 mL at 07/12/14 1045  . FLUoxetine (  PROZAC) capsule 60 mg  60 mg Oral Daily Neita Garnet, MD   60 mg at 07/12/14 0744  . hydrOXYzine (ATARAX/VISTARIL) tablet 25 mg  25 mg Oral Q6H PRN Dara Hoyer, PA-C   25 mg at 07/07/14 1043  . magnesium hydroxide (MILK OF MAGNESIA) suspension 30 mL  30 mL Oral Daily PRN Dara Hoyer, PA-C   30 mL at 07/09/14 1037  . mirtazapine (REMERON) tablet 15 mg  15 mg Oral QHS Neita Garnet, MD   15 mg at 07/11/14 2132  . multivitamin with minerals tablet 1 tablet  1  tablet Oral Daily Dara Hoyer, PA-C   1 tablet at 07/12/14 0744  . nicotine (NICODERM CQ - dosed in mg/24 hours) patch 21 mg  21 mg Transdermal Q0600 Dara Hoyer, PA-C   21 mg at 07/12/14 0646  . risperiDONE (RISPERDAL) tablet 1 mg  1 mg Oral QHS Dara Hoyer, PA-C   1 mg at 07/11/14 2132  . thiamine (B-1) injection 100 mg  100 mg Intramuscular Once Benjamine Mola, FNP      . thiamine (VITAMIN B-1) tablet 100 mg  100 mg Oral Daily Dara Hoyer, PA-C   100 mg at 07/12/14 8786    Lab Results:  Results for orders placed during the hospital encounter of 07/05/14 (from the past 48 hour(s))  TSH     Status: None   Collection Time    07/12/14  6:30 AM      Result Value Ref Range   TSH 1.060  0.350 - 4.500 uIU/mL   Comment: Performed at [facility]    Physical Findings: AIMS: Facial and Oral Movements Muscles of Facial Expression: None, normal Lips and Perioral Area: None, normal Jaw: None, normal Tongue: None, normal,Extremity Movements Upper (arms, wrists, hands, fingers): None, normal Lower (legs, knees, ankles, toes): None, normal, Trunk Movements Neck, shoulders, hips: None, normal, Overall Severity Severity of abnormal movements (highest score from questions above): None, normal Incapacitation due to abnormal movements: None, normal Patient's awareness of abnormal movements (rate only patient's report): No Awareness, Dental Status Current problems with teeth and/or dentures?: No Does patient usually wear dentures?: No  CIWA:  CIWA-Ar Total: 1 COWS:  COWS Total Score: 5   Assessment: Patient continues to feel tired however she is improving slowly from the past.  She requires encouragement to participate in group milieu therapy.  She still have mild tremors and shakes .  Her grooming is slowly improved and she is more interactive with the staff. No medication  Side effects. Treatment Plan Summary: Daily contact with patient to assess and evaluate symptoms and  progress in treatment Medication management See below  Plan: Continue inpatient  for stabilization and treatment. As discussed with staff, bed at [facility] available Monday. Continue to provide support, milieu Prozac  60 mgrs QDAY  Risperidone 1 mgr QHS. Campral 666 mgrs TID Remeron 15 mgrs QHS Results pending for  B12 and vitamin D however heart TSH, B12 and Folate levels are normal.   Medical Decision Making Problem Points:  Established problem, stable/improving (1), Review of last therapy session (1) and Review of psycho-social stressors (1) Data Points:  Review or order clinical lab tests (1) Review of medication regiment & side effects (2) Review of new medications or change in dosage (2)  I certify that inpatient services furnished can reasonably be expected to improve the patient's condition.   Kelvin Sennett T. 07/12/2014, 12:28 PM

## 2014-07-12 NOTE — BHH Group Notes (Signed)
[facility] Group Notes:  Group Goals  Date:  07/12/2014  Time:  9:55 AM  Type of Therapy:  Nurse Education  Participation Level:  Active  Participation Quality:  Appropriate  Affect:  Appropriate  Cognitive:  Alert  Insight:  Appropriate  Engagement in Group:  Engaged  Modes of Intervention:  Discussion  Summary of Progress/Problems: Pt s goal is to let the [place] things go" Delman Kitten 07/12/2014, 9:55 AM

## 2014-07-13 NOTE — Progress Notes (Signed)
Psychoeducational Group Note  Date:  07/13/2014 Time:  1015  Group Topic/Focus: Being Greatful: This group focuses on helping patients identify 3 pillars of strength in their lives and also identify what they have learned thus far in their lives.   Participation Level: Did Not Attend  Participation Quality:  Not Applicable  Affect:  Not Applicable  Cognitive:  Not Applicable  Insight:  Not Applicable  Engagement in Group: Not Applicable  Additional Comments:    Lauralyn Primes 07/13/2014, 11:03 AM

## 2014-07-13 NOTE — Progress Notes (Signed)
Attended group 

## 2014-07-13 NOTE — Progress Notes (Signed)
Writer spoke with patient 1:1 at the medication window and she reports her day as being ok. She c/o her right arm being sore and bruise from a blood draw done on today. Writer examined her arm and noticed slight bruising and a small bump which was slightly sore to the touch but no redness, swelling or  heat around the area. Patient received a cold pack to place on the area for comfort.Writer observed that the patient had done her hair today and her dressing had improved.She is hopeful to discharge on tomorrow. Patient voiced no other complaints and was later observed sitting in the dayroom watching tv shortly before group started and she then returned to her room. Support and encouragement given, safety maintained on unit with 15 min checks.

## 2014-07-13 NOTE — Progress Notes (Signed)
D) Pt spent the morning in bed and then got up to go to lunch and attended the afternoon groups. Affect remains flat but there was a spontaneous smile and laughter this afternoon for the first time. Pt denies SI and HI. Rates her depression at a 5, hopelessness at a 6 and anxiety at a 5. States she is feeling a [place] better. A) given support, reassurance and praise along with encouragement. Provided with a brief 1:1.  R) Denies SI and HI.

## 2014-07-13 NOTE — Progress Notes (Signed)
[facility] MD Progress Note  07/13/2014 10:42 AM [name]  MRN:  [id number] Subjective:  I am doing so-so.      Objective:  Patient seen chart reviewed.  She continued to endorse complaining of feeling tired and she had missed groups for the above reason.  She continues to feel depressed hopeless and anxious .  She denies any suicidal thoughts or homicidal thoughts.  She requires excessive encouragement to participate in the groups .  She does not feel her sedation is medication related .  She was seen going to the cafeteria for meals without any problem.  She endorse some time having nightmares and flashback off her previous trauma and admitted not feel comfortable around groups talking about her illness.  She admitted combination and intrusive thoughts but denies any hallucinations or any paranoia.    Diagnosis:  Alcohol Dependence, Alcohol Withdrawal, Depression NOS, consider MDD versus substance induced   Total Time spent with patient: 20 minutes    ADL's:  Improved   Sleep: Unchanged from the past.     Appetite: improved   Suicidal Ideation:  At this time denies suicidal ideations Homicidal Ideation:  At this time denies homicidal ideations AEB (as evidenced by):  Psychiatric Specialty Exam: Physical Exam  Review of Systems  Constitutional: Positive for malaise/fatigue. Negative for fever, chills and weight loss.       States she feels chronically tired, fatigued   Respiratory: Negative for cough and shortness of breath.   Cardiovascular: Negative for chest pain.  Gastrointestinal: Negative for nausea, vomiting and abdominal pain.  Genitourinary: Negative for dysuria, urgency and frequency.  Musculoskeletal: Negative.        Slow , slightly unsteady ambulation  Psychiatric/Behavioral: Positive for depression and substance abuse.    Blood pressure 121/78, pulse 89, temperature 97.9 F (36.6 C), temperature source Oral, resp. rate 20, height 5\' 7"  (1.702 m), weight 153 lb  (69.4 kg), SpO2 100.00%.Body mass index is 23.96 kg/(m^2).  General Appearance: Guarded    Eye Contact::  Fair  Speech:  Slow  Volume:  Decreased  Mood:  Depression slowly improving  Affect:  Constricted but more reactive   Thought Process:  Linear and but slow  Orientation:  Other:  alert and attentive, no current evidence of delirium  Thought Content:  denies hallucinations, no delusions  Suicidal Thoughts:  No- currently denies any suicidal or homicidal ideations/plans/intentions  Homicidal Thoughts:  No  Memory:  fair   Judgement:  Fair  Insight:  Fair  Psychomotor Activity: improving   Concentration:  Fair  Recall:  Good  Fund of Knowledge:Good  Language: Fair  Akathisia:  Negative  Handed:  Right  AIMS (if indicated):     Assets:  Desire for Improvement Resilience  Sleep:  Number of Hours: 6.25   Musculoskeletal: Strength & Muscle Tone: within normal limits Gait & Station: slow, somehwat unsteady Patient leans: N/A  Current Medications: Current Facility-Administered Medications  Medication Dose Route Frequency Provider Last Rate Last Dose  . acamprosate (CAMPRAL) tablet 666 mg  666 mg Oral TID WC Neita Garnet, MD   666 mg at 07/13/14 0645  . acetaminophen (TYLENOL) tablet 650 mg  650 mg Oral Q6H PRN Dara Hoyer, PA-C   650 mg at 07/09/14 1036  . alum & mag hydroxide-simeth (MAALOX/MYLANTA) 200-200-20 MG/5ML suspension 30 mL  30 mL Oral Q4H PRN Dara Hoyer, PA-C      . feeding supplement (ENSURE COMPLETE) (ENSURE COMPLETE) liquid 237 mL  237  mL Oral BID BM Encarnacion Slates, NP   237 mL at 07/12/14 1931  . FLUoxetine (PROZAC) capsule 60 mg  60 mg Oral Daily Neita Garnet, MD   60 mg at 07/13/14 0732  . hydrOXYzine (ATARAX/VISTARIL) tablet 25 mg  25 mg Oral Q6H PRN Dara Hoyer, PA-C   25 mg at 07/07/14 1043  . magnesium hydroxide (MILK OF MAGNESIA) suspension 30 mL  30 mL Oral Daily PRN Dara Hoyer, PA-C   30 mL at 07/09/14 1037  . mirtazapine (REMERON)  tablet 15 mg  15 mg Oral QHS Neita Garnet, MD   15 mg at 07/12/14 2138  . multivitamin with minerals tablet 1 tablet  1 tablet Oral Daily Dara Hoyer, PA-C   1 tablet at 07/13/14 0732  . nicotine (NICODERM CQ - dosed in mg/24 hours) patch 21 mg  21 mg Transdermal Q0600 Dara Hoyer, PA-C   21 mg at 07/13/14 6503  . risperiDONE (RISPERDAL) tablet 1 mg  1 mg Oral QHS Dara Hoyer, PA-C   1 mg at 07/12/14 2138  . thiamine (B-1) injection 100 mg  100 mg Intramuscular Once Benjamine Mola, FNP      . thiamine (VITAMIN B-1) tablet 100 mg  100 mg Oral Daily Dara Hoyer, PA-C   100 mg at 07/13/14 5465    Lab Results:  Results for orders placed during the hospital encounter of 07/05/14 (from the past 48 hour(s))  VITAMIN B12     Status: None   Collection Time    07/12/14  6:30 AM      Result Value Ref Range   Vitamin B-12 334  211 - 911 pg/mL   Comment: Performed at [facility]  TSH     Status: None   Collection Time    07/12/14  6:30 AM      Result Value Ref Range   TSH 1.060  0.350 - 4.500 uIU/mL   Comment: Performed at [place]     Status: None   Collection Time    07/12/14  6:30 AM      Result Value Ref Range   Folate 14.4     Comment: (NOTE)     Reference Ranges            Deficient:       0.4 - 3.3 ng/mL            Indeterminate:   3.4 - 5.4 ng/mL            Normal:              > 5.4 ng/mL     Performed at [facility]    Physical Findings: AIMS: Facial and Oral Movements Muscles of Facial Expression: None, normal Lips and Perioral Area: None, normal Jaw: None, normal Tongue: None, normal,Extremity Movements Upper (arms, wrists, hands, fingers): None, normal Lower (legs, knees, ankles, toes): None, normal, Trunk Movements Neck, shoulders, hips: None, normal, Overall Severity Severity of abnormal movements (highest score from questions above): None, normal Incapacitation due to abnormal movements: None, normal Patient's  awareness of abnormal movements (rate only patient's report): No Awareness, Dental Status Current problems with teeth and/or dentures?: No Does patient usually wear dentures?: No  CIWA:  CIWA-Ar Total: 1 COWS:  COWS Total Score: 5   Assessment: Patient continues to feel tired however she is improving slowly from the past.  She requires encouragement to participate in group milieu therapy.  She still have mild tremors and shakes .  Her grooming is slowly improved and she is more interactive with the staff. No medication  Side effects. Treatment Plan Summary: Daily contact with patient to assess and evaluate symptoms and progress in treatment Medication management See below  Plan: The patient requires excessive encouragement to participate and to be involved in group.  No change in her current psychotropic medication.  Continue Prozac 60 mg daily, Risperdal 1 mg at bedtime, Remeron 15 mg at bedtime and Campral 666 mg 3 times a day.  Patient does not report any side effects . Results pending for vitamin D however heart TSH, B12 and Folate levels are normal.   Medical Decision Making Problem Points:  Established problem, stable/improving (1), Review of last therapy session (1) and Review of psycho-social stressors (1) Data Points:  Review or order clinical lab tests (1) Review of medication regiment & side effects (2)  I certify that inpatient services furnished can reasonably be expected to improve the patient's condition.   Donnell Wion T. 07/13/2014, 10:42 AM

## 2014-07-13 NOTE — Plan of Care (Signed)
Problem: Alteration in mood & ability to function due to Goal: STG-Patient will comply with prescribed medication regimen (Patient will comply with prescribed medication regimen)  Outcome: Progressing Patient compliant with scheduled medications.

## 2014-07-13 NOTE — BHH Group Notes (Signed)
[facility] Group Notes:  (Clinical Social Work)  07/13/2014  10:00-11:00AM  Summary of Progress/Problems:   The main focus of today's process group was to   1)  discuss the importance of adding supports  2)  define health supports versus unhealthy supports  3)  identify the patient's current unhealthy supports and plan how to handle them  4)  Identify the patient's current healthy supports and plan what to add.  An emphasis was placed on using counselor, doctor, therapy groups, 12-step groups, and problem-specific support groups to expand supports.    The patient arrived late to group, with only about 20 minutes to go, sat quietly and drowsily, and did not participate in discussion at all.  Type of Therapy:  Process Group with Motivational Interviewing  Participation Level:  Minimal  Participation Quality:  Drowsy  Affect:  Flat  Cognitive:  could not assess  Insight:  Limited  Engagement in Therapy:  Lacking  Modes of Intervention:   Education, Support and Processing, Activity  [employer], LCSW 07/13/2014, 12:15pm

## 2014-07-13 NOTE — Progress Notes (Signed)
Psychoeducational Group Note  Date:  07/13/2014 Time:  1315  Group Topic/Focus:  Making Healthy Choices:   The focus of this group is to help patients identify negative/unhealthy choices they were using prior to admission and identify positive/healthier coping strategies to replace them upon discharge.  Participation Level:  Active  Participation Quality:  Drowsy  Affect:  Flat  Cognitive:  Oriented  Insight:  Lacking  Engagement in Group:  sleepy  Additional Comments:  Pt was sleepy this afternoon and was having problems staying awake in the group.  Paulino Rily 07/13/2014

## 2014-07-14 DIAGNOSIS — F1910 Other psychoactive substance abuse, uncomplicated: Secondary | ICD-10-CM

## 2014-07-14 DIAGNOSIS — F1994 Other psychoactive substance use, unspecified with psychoactive substance-induced mood disorder: Secondary | ICD-10-CM

## 2014-07-14 DIAGNOSIS — F1010 Alcohol abuse, uncomplicated: Secondary | ICD-10-CM

## 2014-07-14 MED ORDER — FLUOXETINE HCL 20 MG PO CAPS: 60.0000 mg | ORAL_CAPSULE | Freq: Every day | ORAL | Status: AC

## 2014-07-14 MED ORDER — ACAMPROSATE CALCIUM 333 MG PO TBEC: 666.0000 mg | DELAYED_RELEASE_TABLET | Freq: Three times a day (TID) | ORAL | Status: AC

## 2014-07-14 MED ORDER — RISPERIDONE 1 MG PO TABS: 1.0000 mg | ORAL_TABLET | Freq: Every day | ORAL | Status: AC

## 2014-07-14 MED ORDER — ADULT MULTIVITAMIN W/MINERALS CH: 1.0000 | ORAL_TABLET | Freq: Every day | ORAL | Status: AC

## 2014-07-14 MED ORDER — HYDROXYZINE HCL 25 MG PO TABS: 25.0000 mg | ORAL_TABLET | Freq: Four times a day (QID) | ORAL | Status: AC | PRN

## 2014-07-14 MED ORDER — MIRTAZAPINE 15 MG PO TABS: 15.0000 mg | ORAL_TABLET | Freq: Every day | ORAL | Status: AC

## 2014-07-14 NOTE — BHH Group Notes (Signed)
[facility] LCSW Aftercare Discharge Planning Group Note   07/14/2014 8:34 AM    Participation Quality:  [place]  Mood/Affect:  Appropriate  Depression Rating:  6  Anxiety Rating:  5  Thoughts of Suicide:  No  Will you contract for safety?   NA  Current AVH:  No  Plan for Discharge/Comments:  Patient attended discharge planning group and actively participated in group.  Patient will follow up with [facility] for residential treatment and reports being safe to discharge there today.  CSW provided all participants with daily workbook.   Transportation Means: Patient has transportation.   Supports:  Patient has a support system.   [name], Eulas Post

## 2014-07-14 NOTE — Progress Notes (Signed)
Patient ID: KALIKA SMAY, female   DOB: 06-17-1956, 58 y.o.   MRN: [id number] PER STATE REGULATIONS 482.30  THIS CHART WAS REVIEWED FOR MEDICAL NECESSITY WITH RESPECT TO THE PATIENT'S ADMISSION/ DURATION OF STAY.  NEXT REVIEW DATE: 07/16/2014  Chauncy Lean, RN, BSN CASE MANAGER

## 2014-07-14 NOTE — Progress Notes (Signed)
D/C instructions/meds/follow-up appointments reviewed, pt verbalized understanding, pt's belongings returned to pt, samples given. 

## 2014-07-14 NOTE — [facility] Suicide Risk Assessment (Signed)
Suicide Risk Assessment  Discharge Assessment     Demographic Factors:  NA  Total Time spent with patient: 30 minutes  Psychiatric Specialty Exam:     Blood pressure 121/91, pulse 80, temperature 97.5 F (36.4 C), temperature source Oral, resp. rate 18, height 5\' 7"  (1.702 m), weight 69.4 kg (153 lb), SpO2 100.00%.Body mass index is 23.96 kg/(m^2).  General Appearance: Fairly Groomed  Engineer, water::  Fair  Speech:  Clear and Coherent  Volume:  Normal  Mood:  Euthymic  Affect:  Appropriate  Thought Process:  Coherent and Goal Directed  Orientation:  Full (Time, Place, and Person)  Thought Content:  plans as she moves on, relapse prevention plan  Suicidal Thoughts:  No  Homicidal Thoughts:  No  Memory:  Immediate;   Fair Recent;   Fair Remote;   Fair  Judgement:  Fair  Insight:  Present  Psychomotor Activity:  Normal  Concentration:  Fair  Recall:  [employer] [place]  Language: Fair  Akathisia:  No  Handed:    AIMS (if indicated):     Assets:  Desire for Improvement  Sleep:  Number of Hours: 6.75    Musculoskeletal: Strength & Muscle Tone: within normal limits Gait & Station: normal Patient leans: N/A   Mental Status Per Nursing Assessment::   On Admission:  NA  Current Mental Status by Physician: in full contact with reality. There are no active S/S of withdrawal. She is willing and motivated to pursue further residential treatment at [facility]   Loss Factors: Decline in physical health  Historical Factors: NA  Risk Reduction Factors:   NA  Continued Clinical Symptoms:  Alcohol/Substance Abuse/Dependencies  Cognitive Features That Contribute To Risk:  Polarized thinking Thought constriction (tunnel vision)    Suicide Risk:  Minimal: No identifiable suicidal ideation.  Patients presenting with no risk factors but with morbid ruminations; may be classified as minimal risk based on the severity of the depressive symptoms  Discharge Diagnoses:   AXIS I:  Alcohol Cocaine Dependence, Substance Induced Mood Disorder AXIS II:  No diagnosis AXIS III:   Past Medical History  Diagnosis Date  . Depression   . Migraine headache   . Chronic back pain   . Alcohol dependence 06/11/2013  . Asthma   . Cocaine abuse    AXIS IV:  other psychosocial or environmental problems AXIS V:  61-70 mild symptoms  Plan Of Care/Follow-up recommendations:  Activity:  as tolerated Diet:  regular Follow up [facility] Is patient on multiple antipsychotic therapies at discharge:  No   Has Patient had three or more failed trials of antipsychotic monotherapy by history:  No  Recommended Plan for Multiple Antipsychotic Therapies: NA    Messiah Rovira A 07/14/2014, 1:30 PM

## 2014-07-14 NOTE — Discharge Summary (Signed)
Physician Discharge Summary Note  Patient:  [name] is an 58 y.o., female MRN:  [id number] DOB:  [birth year] Patient phone:  [phone] (home)  Patient address:   [place] [address],  Total Time spent with patient: 30 minutes  Date of Admission:  07/05/2014 Date of Discharge: 07/14/2014  Reason for Admission:  Depression, polysubstance abuse  Discharge Diagnoses: Active Problems:   Alcohol dependence   Substance induced mood disorder  Psychiatric Specialty Exam: Physical Exam  Constitutional: She is oriented to person, place, and time. She appears well-developed.  HENT:  Head: Normocephalic.  Eyes: Right eye exhibits no discharge. Left eye exhibits no discharge. No scleral icterus.  Neck: Normal range of motion.  Cardiovascular: Normal rate.   Respiratory: Effort normal.  GI: Soft.  Genitourinary:  NA  Musculoskeletal: Normal range of motion.  Neurological: She is alert and oriented to person, place, and time.  Skin: Skin is warm and dry.  Psychiatric: She has a normal mood and affect. Her behavior is normal. Judgment and thought content normal.    Review of Systems  Constitutional: Negative.   HENT: Negative.   Eyes: Negative.   Respiratory: Negative.   Cardiovascular: Negative.   Gastrointestinal: Negative.   Genitourinary: Negative.   Musculoskeletal: Negative.   Skin: Negative.   Neurological: Negative.   Endo/Heme/Allergies: Negative.   Psychiatric/Behavioral: Positive for depression (Hx of, stable).    Blood pressure 133/96, pulse 76, temperature 97.5 F (36.4 C), temperature source Oral, resp. rate 16, height 5\' 7"  (1.702 m), weight 69.4 kg (153 lb), SpO2 100.00%.Body mass index is 23.96 kg/(m^2).   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 Depression   . Migraine headache   . Chronic back pain   . Alcohol dependence 06/11/2013  . Asthma   . Cocaine abuse    AXIS IV:  economic problems and other psychosocial or environmental problems AXIS V:  61-70 mild symptoms  Level of Care:  RTC, [facility]  Hospital Course:  [name] was initially seen at  [facility] without plan and polysubstance (cocaine/etoh) abuse.  She has history of depression and SI (cut herself).  Reported that she has undergone detox program a couple months ago.  She did not have Pt states that she is depressed and thinks she may hurt herself if she doesn't stop using cocaine and etoh. Used both last this morning. States that she has tried cutting herself in the past. She states that she last went thru detox a couple of months ago.  She had not been taking medications in the last 3 days and weak but no c/o pain.    During her stay at [facility], [name] participated in group therapy sessions to learn and develop better coping mechanisms to deal with daily problems that cause anxiety, depressive mood  and substance abuse.  Additionally, after evaluation and treatment medication regimen was prescribed to include Acamprosate 333 mg for alcohol dependence, Fluoxetine 20 mg for depression, Hydroxyzine 25 mg for anxiety, Mirtazapine 15 mg for sleep and depression, Risperidone 1 mg for mood stabilization.    [name] will be transferred  to [place], [place], [place] for continuation of treatment.    Consults:  psychiatry  Significant Diagnostic Studies:  labs: per ED  Discharge Vitals:   Blood pressure 133/96, pulse 76, temperature 97.5 F (36.4 C), temperature source Oral, resp. rate 16, height 5\' 7"  (1.702 m), weight 69.4 kg (153 lb), SpO2 100.00%. Body mass index is 23.96 kg/(m^2). Lab Results:   Results for orders placed during the hospital encounter of 07/05/14 (from the past 72 hour(s))  VITAMIN B12     Status: None   Collection Time    07/12/14  6:30 AM      Result Value Ref Range   Vitamin B-12 334  211 - 911 pg/mL   Comment: Performed at [facility]  TSH     Status: None   Collection Time    07/12/14  6:30 AM      Result Value Ref Range   TSH 1.060  0.350 - 4.500 uIU/mL   Comment: Performed at [facility]     Status: None   Collection Time    07/12/14  6:30 AM      Result Value Ref Range   Folate 14.4     Comment: (NOTE)     Reference Ranges            Deficient:       0.4 - 3.3 ng/mL            Indeterminate:   3.4 - 5.4 ng/mL            Normal:              > 5.4 ng/mL     Performed at [facility]    Physical Findings: AIMS: Facial and Oral Movements Muscles of Facial Expression: None, normal Lips and Perioral Area: None, normal Jaw: None, normal Tongue: None, normal,Extremity Movements Upper (arms, wrists, hands, fingers): None, normal Lower (legs, knees, ankles, toes): None, normal, Trunk Movements Neck, shoulders, hips: None, normal, Overall Severity Severity of abnormal movements (highest score from questions above): None, normal Incapacitation due to abnormal movements: None, normal Patient's awareness of abnormal movements (rate only patient's report): No Awareness, Dental Status Current problems with teeth and/or dentures?: No Does patient usually wear dentures?: No  CIWA:  CIWA-Ar Total: 1 COWS:  COWS Total Score: 5  Psychiatric  Specialty Exam: See Psychiatric Specialty Exam and Suicide Risk Assessment completed by Attending Physician prior to discharge.  Discharge destination:  [facility]  Is patient on multiple antipsychotic therapies at discharge:  No   Has Patient had three or more failed trials of antipsychotic monotherapy by history:  No  Recommended Plan for Multiple Antipsychotic Therapies: NA     Medication List       Indication   FLUoxetine 40 MG capsule  Commonly known as:  PROZAC  Take 1 capsule (40 mg total) by mouth daily. For depression   Indication:  mood stabilization     hydrOXYzine 25 MG tablet  Commonly known as:  ATARAX/VISTARIL  Take 1 tablet (25 mg total) by mouth every 6 (six) hours as needed for anxiety.   Indication:  anxiety     multivitamin with  minerals Tabs tablet  Take 1 tablet by mouth daily.   Indication:  OTC, Vitamin deficiency     risperiDONE 1 MG tablet  Commonly known as:  RISPERDAL  Take 1 tablet (1 mg total) by mouth at bedtime. For mood control   Indication:  Mood control     traZODone 50 MG tablet  Commonly known as:  DESYREL  Take 1 tablet (50 mg total) by mouth at bedtime as needed for sleep. For sleep   Indication:  Trouble Sleeping         Follow-up recommendations:  Activity:  As tolerated Diet:  As tolerated  Comments:  1.  Take all your medications as prescribed.              2.  Report any adverse side effects to outpatient provider.                       3.  Patient instructed to not use alcohol or illegal drugs while on prescription medicines.            4.  In the event of worsening symptoms, instructed patient to call 911, the crisis hotline or go to nearest emergency room for evaluation of symptoms.  Total Discharge Time:  Greater than 30 minutes.  SignedKerrie Buffalo MAY, AGNP-BC 07/14/2014, 10:42 AM I personally assessed the patient and formulated the plan Geralyn Flash A. Sabra Heck, M.D.

## 2014-07-14 NOTE — Plan of Care (Signed)
Problem: Alteration in mood Goal: LTG-Patient reports reduction in suicidal thoughts (Patient reports reduction in suicidal thoughts and is able to verbalize a safety plan for whenever patient is feeling suicidal)  Outcome: Progressing Patient denies suicidal ideations      

## 2014-07-14 NOTE — Plan of Care (Signed)
Problem: Alteration in mood & ability to function due to Goal: STG-Patient will attend groups Outcome: Progressing Patient attended AA group this evening.

## 2014-07-14 NOTE — [facility] Suicide Risk Assessment (Signed)
[place] INPATIENT:  Family/Significant Other Suicide Prevention Education  Suicide Prevention Education:  Patient Discharged to [facility]:  Suicide Prevention Education Not Provided: {PT. DISCHARGED TO OTHER HEALTHCARE FACILITY:SUICIDE PREVENTION EDUCATION NOT PROVIDED (CHL):  The patient is discharging to another healthcare facility for continuation of treatment.  The patient's medical information, including suicide ideations and risk factors, are a part of the medical information shared with the receiving healthcare facility.  Patient discharging to [facility]   Concha Pyo 07/14/2014, 11:48 AM

## 2014-07-14 NOTE — Progress Notes (Signed)
[facility] Adult Case Management Discharge Plan :  Will you be returning to the same living situation after discharge: No.  Patient discharging to [facility] for residential treatment At discharge, do you have transportation home?:Yes,  [facility] will transport patient for treatment. Do you have the ability to pay for your medications:Yes,  Patient is able to obtain medications.  Release of information consent forms completed and in the chart;  Patient's signature needed at discharge.  Patient to Follow up at: Follow-up Information   Follow up with [facility] On 07/14/2014. (Monday, July 14, 2014 at 2:00 PM)    Contact information:   [place], [place]  [phone]      Patient denies SI/HI:   Patient no longer endorsing SI/HI or other thoughts of self harm.     Safety Planning and Suicide Prevention discussed:  .Reviewed with all patients during discharge planning group.  [name] 07/14/2014, 11:09 AM

## 2014-07-16 NOTE — Progress Notes (Signed)
Patient Discharge Instructions:  After Visit Summary (AVS):   Faxed to:  07/16/14 Discharge Summary Note:   Faxed to:  07/16/14 Psychiatric Admission Assessment Note:   Faxed to:  07/16/14 Suicide Risk Assessment - Discharge Assessment:   Faxed to:  07/16/14 Faxed/Sent to the Next Level Care provider:  07/16/14 Faxed to [facility] @ [phone]  Patsey Berthold, 07/16/2014, 3:37 PM

## 2014-07-17 LAB — VITAMIN D 1,25 DIHYDROXY
Vitamin D 1, 25 (OH)2 Total: 38 pg/mL (ref 18–72)
Vitamin D2 1, 25 (OH)2: <8 pg/mL
Vitamin D3 1, 25 (OH)2: 38 pg/mL

## 2014-10-22 IMAGING — CT CT HEAD W/O CM
1 series · 16 of 30 positions shown, 20 images · non-contrast
Comparison: Head CT 10/22/2004.

CLINICAL DATA: History of trauma from a fall with injury to the
right side of the head.

EXAM:
CT HEAD WITHOUT CONTRAST
TECHNIQUE: Contiguous axial images were obtained from the base of the skull
through the vertex without intravenous contrast.

[Series 2: head 5.0 h30s · axial · 0.44mm/px · z∈[-126,+9]mm · 16 of 31 slices shown, 20 images]
[im 2/31  brain]
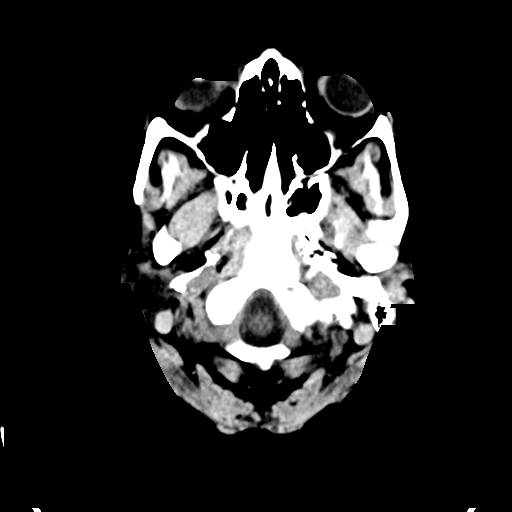
[im 2/31  bone]
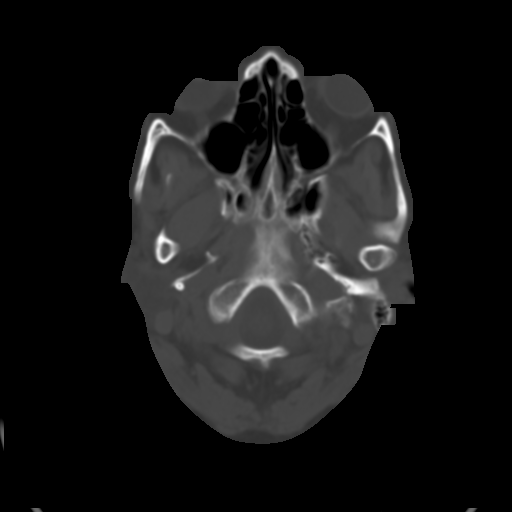
[im 4/31  brain]
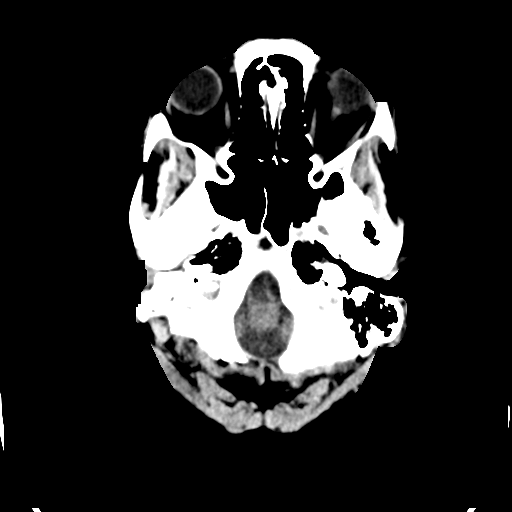
[im 6/31  brain]
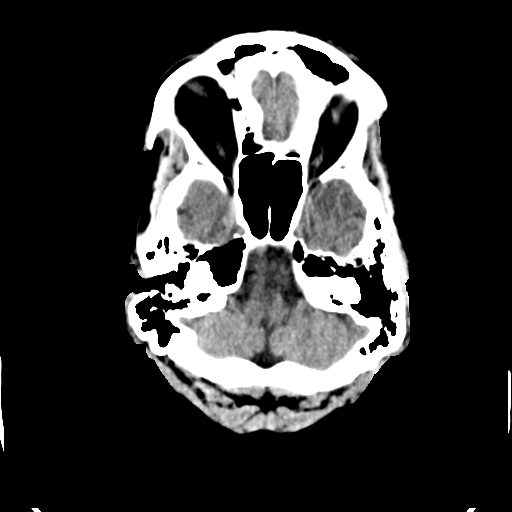
[im 8/31  brain]
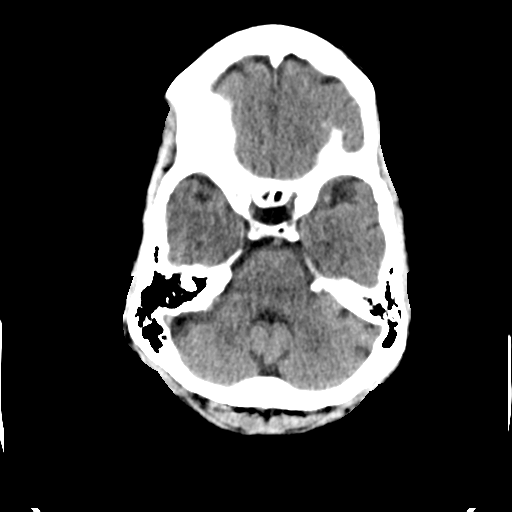
[im 9/31  brain]
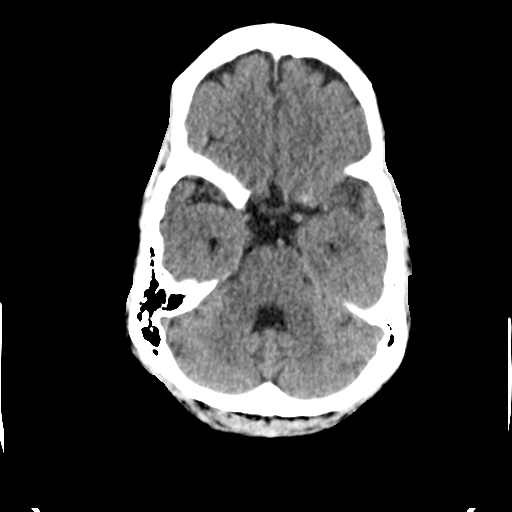
[im 9/31  bone]
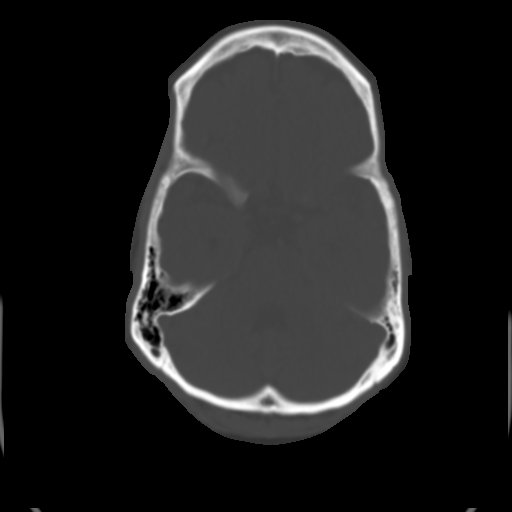
[im 11/31  brain]
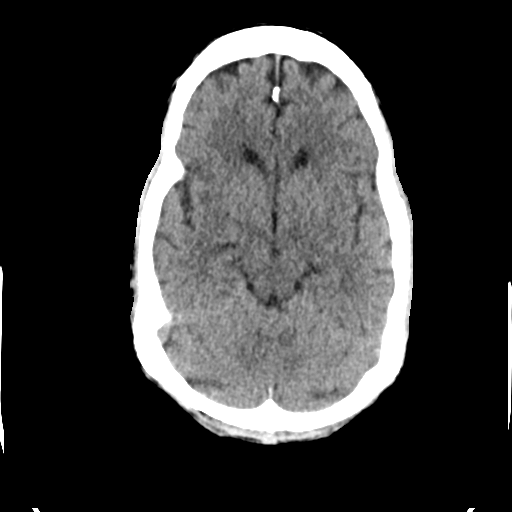
[im 13/31  brain]
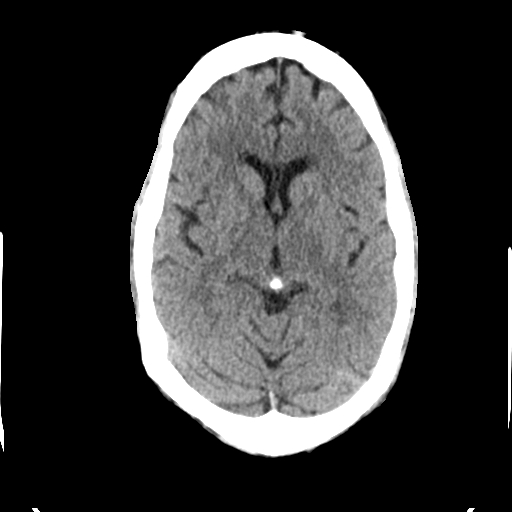
[im 15/31  brain]
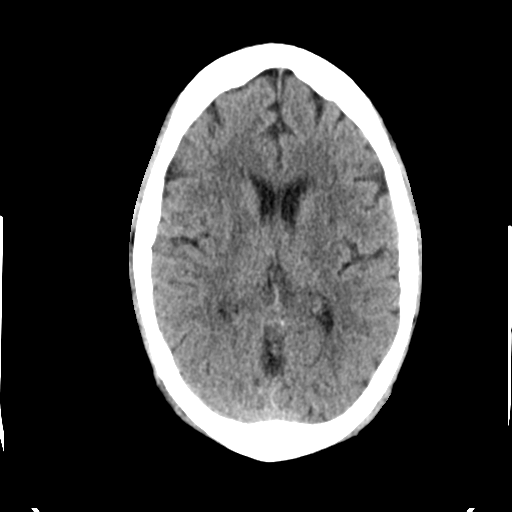
[im 16/31  brain]
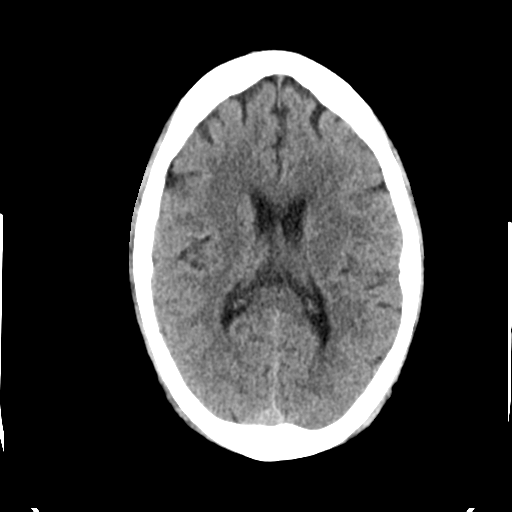
[im 16/31  bone]
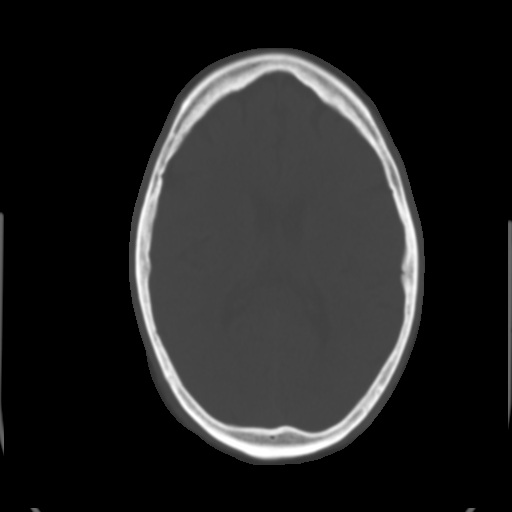
[im 18/31  brain]
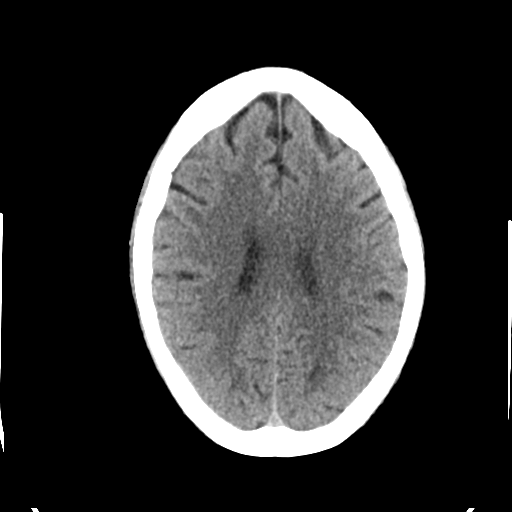
[im 20/31  brain]
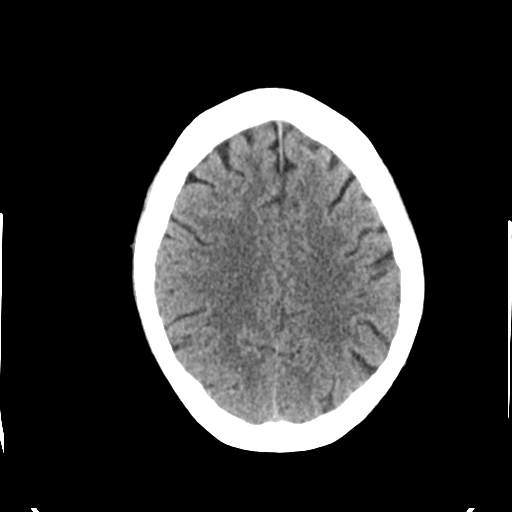
[im 22/31  brain]
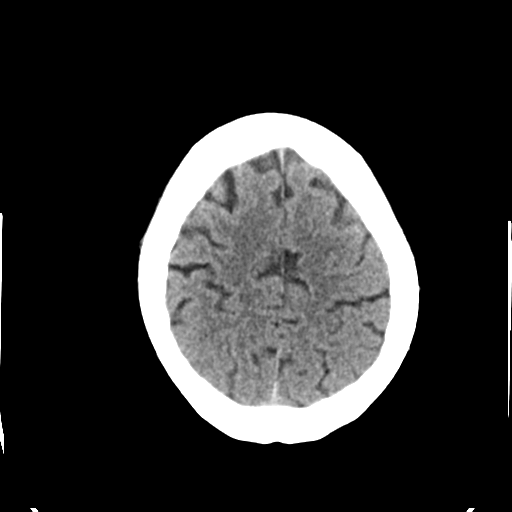
[im 23/31  brain]
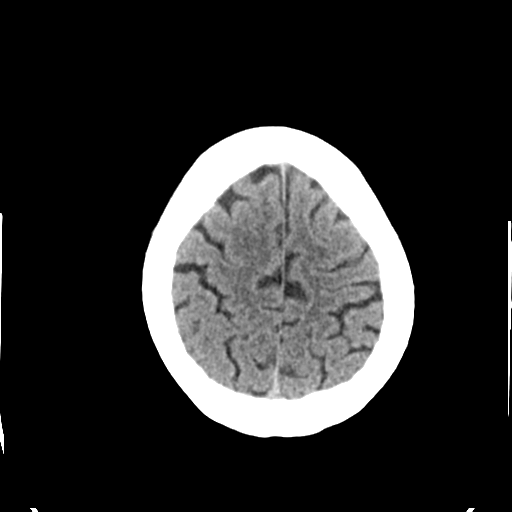
[im 23/31  bone]
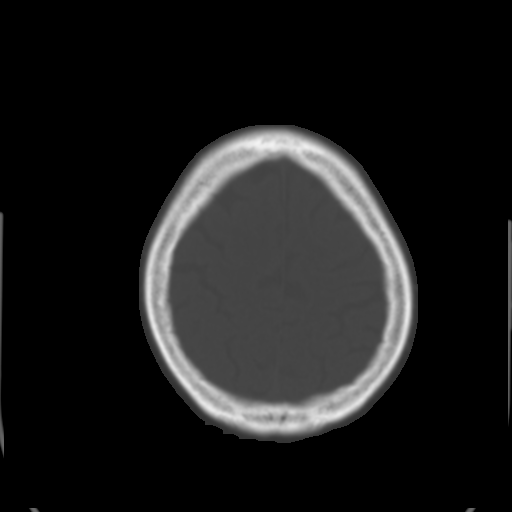
[im 25/31  brain]
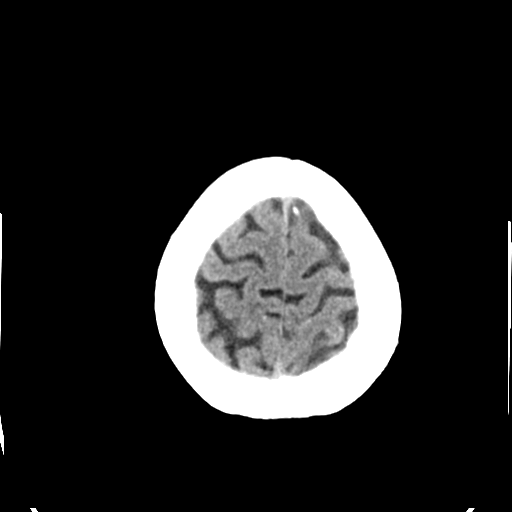
[im 27/31  brain]
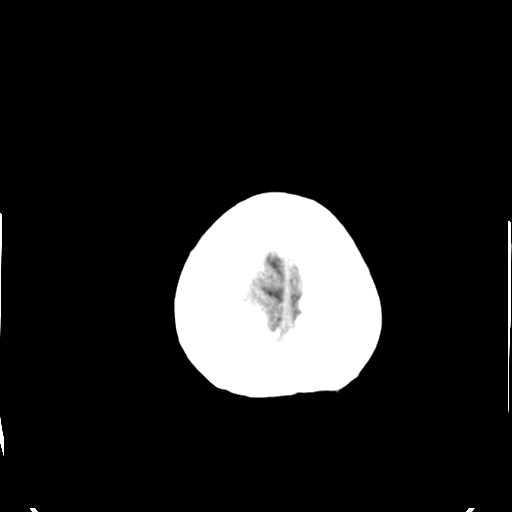
[im 29/31  brain]
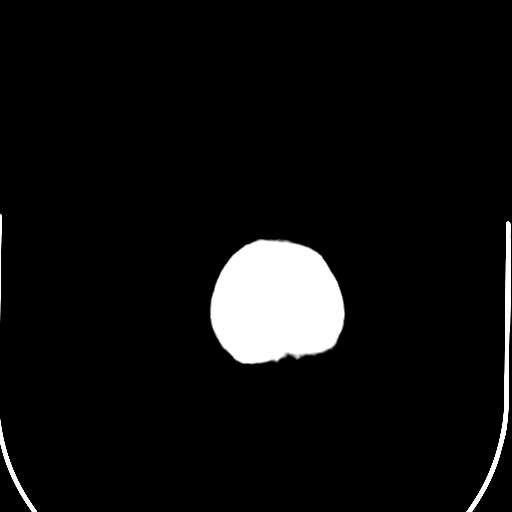

[16 of 30 positions shown; findings below may reference images not displayed]

FINDINGS: Patchy areas of mild decreased attenuation are noted throughout the
deep and periventricular white matter of the cerebral hemispheres
bilaterally, compatible with chronic microvascular ischemic disease.
No acute displaced skull fractures are identified. No acute
intracranial abnormality. Specifically, no evidence of acute
post-traumatic intracranial hemorrhage, no definite regions of
acute/subacute cerebral ischemia, no focal mass, mass effect,
hydrocephalus or abnormal intra or extra-axial fluid collections.
The visualized paranasal sinuses and mastoids are well pneumatized.
IMPRESSION: 1. No acute displaced skull fractures or acute intracranial
abnormalities.
2. Mild chronic microvascular ischemic changes in the cerebral white
matter, as above.

## 2014-12-28 ENCOUNTER — Encounter (HOSPITAL_COMMUNITY): Payer: Self-pay | Admitting: *Deleted

## 2014-12-28 ENCOUNTER — Emergency Department (HOSPITAL_COMMUNITY)
Admission: EM | Admit: 2014-12-28 | Discharge: 2014-12-28 | Disposition: A | Discharge status: Home / Self Care | Payer: Medicare Other | Attending: Emergency Medicine | Admitting: Emergency Medicine

## 2014-12-28 DIAGNOSIS — Z3202 Encounter for pregnancy test, result negative: Secondary | ICD-10-CM | POA: Insufficient documentation

## 2014-12-28 DIAGNOSIS — J45909 Unspecified asthma, uncomplicated: Secondary | ICD-10-CM | POA: Insufficient documentation

## 2014-12-28 DIAGNOSIS — K2920 Alcoholic gastritis without bleeding: Secondary | ICD-10-CM

## 2014-12-28 DIAGNOSIS — Z79899 Other long term (current) drug therapy: Secondary | ICD-10-CM | POA: Insufficient documentation

## 2014-12-28 DIAGNOSIS — F419 Anxiety disorder, unspecified: Secondary | ICD-10-CM | POA: Diagnosis not present

## 2014-12-28 DIAGNOSIS — G43909 Migraine, unspecified, not intractable, without status migrainosus: Secondary | ICD-10-CM | POA: Insufficient documentation

## 2014-12-28 DIAGNOSIS — G8929 Other chronic pain: Secondary | ICD-10-CM | POA: Insufficient documentation

## 2014-12-28 DIAGNOSIS — R109 Unspecified abdominal pain: Secondary | ICD-10-CM | POA: Diagnosis present

## 2014-12-28 DIAGNOSIS — Z720 Tobacco use: Secondary | ICD-10-CM | POA: Diagnosis not present

## 2014-12-28 DIAGNOSIS — F329 Major depressive disorder, single episode, unspecified: Secondary | ICD-10-CM | POA: Diagnosis not present

## 2014-12-28 LAB — RAPID URINE DRUG SCREEN, HOSP PERFORMED
Amphetamines: NOT DETECTED
Barbiturates: NOT DETECTED
Benzodiazepines: NOT DETECTED
COCAINE: POSITIVE — AB
Opiates: NOT DETECTED
Tetrahydrocannabinol: NOT DETECTED

## 2014-12-28 LAB — COMPREHENSIVE METABOLIC PANEL
ALK PHOS: 99 U/L (ref 39–117)
ALT: 16 U/L (ref 0–35)
AST: 25 U/L (ref 0–37)
Albumin: 4 g/dL (ref 3.5–5.2)
Anion gap: 10 (ref 5–15)
BUN: 6 mg/dL (ref 6–23)
CALCIUM: 9.2 mg/dL (ref 8.4–10.5)
CO2: 25 mmol/L (ref 19–32)
Chloride: 102 mmol/L (ref 96–112)
Creatinine, Ser: 0.98 mg/dL (ref 0.50–1.10)
GFR calc Af Amer: 72 mL/min — ABNORMAL LOW (ref >90)
GFR, EST NON AFRICAN AMERICAN: 62 mL/min — AB (ref >90)
Glucose, Bld: 98 mg/dL (ref 70–99)
Potassium: 3.5 mmol/L (ref 3.5–5.1)
Sodium: 137 mmol/L (ref 135–145)
Total Bilirubin: 0.7 mg/dL (ref 0.3–1.2)
Total Protein: 6.9 g/dL (ref 6.0–8.3)

## 2014-12-28 LAB — CBC WITH DIFFERENTIAL/PLATELET
Basophils Absolute: 0 10*3/uL (ref 0.0–0.1)
Basophils Relative: 0 % (ref 0–1)
Eosinophils Absolute: 0.1 10*3/uL (ref 0.0–0.7)
Eosinophils Relative: 1 % (ref 0–5)
HCT: 44.9 % (ref 36.0–46.0)
Hemoglobin: 15.3 g/dL — ABNORMAL HIGH (ref 12.0–15.0)
Lymphocytes Relative: 45 % (ref 12–46)
Lymphs Abs: 2.6 10*3/uL (ref 0.7–4.0)
MCH: 29.4 pg (ref 26.0–34.0)
MCHC: 34.1 g/dL (ref 30.0–36.0)
MCV: 86.2 fL (ref 78.0–100.0)
MONOS PCT: 9 % (ref 3–12)
Monocytes Absolute: 0.5 10*3/uL (ref 0.1–1.0)
NEUTROS PCT: 45 % (ref 43–77)
Neutro Abs: 2.6 10*3/uL (ref 1.7–7.7)
Platelets: 310 10*3/uL (ref 150–400)
RBC: 5.21 MIL/uL — ABNORMAL HIGH (ref 3.87–5.11)
RDW: 13.4 % (ref 11.5–15.5)
WBC: 5.8 10*3/uL (ref 4.0–10.5)

## 2014-12-28 LAB — URINALYSIS, ROUTINE W REFLEX MICROSCOPIC
Bilirubin Urine: NEGATIVE
GLUCOSE, UA: NEGATIVE mg/dL
KETONES UR: NEGATIVE mg/dL
LEUKOCYTES UA: NEGATIVE
NITRITE: NEGATIVE
PH: 7 (ref 5.0–8.0)
Protein, ur: NEGATIVE mg/dL
SPECIFIC GRAVITY, URINE: 1.006 (ref 1.005–1.030)
UROBILINOGEN UA: 0.2 mg/dL (ref 0.0–1.0)

## 2014-12-28 LAB — URINE MICROSCOPIC-ADD ON

## 2014-12-28 LAB — POC URINE PREG, ED: Preg Test, Ur: NEGATIVE

## 2014-12-28 LAB — LIPASE, BLOOD: LIPASE: 34 U/L (ref 11–59)

## 2014-12-28 LAB — ETHANOL

## 2014-12-28 MED ORDER — GI COCKTAIL ~~LOC~~
30.0000 mL | Freq: Once | ORAL | Status: AC
  Administered 2014-12-28: 30 mL via ORAL
  Filled 2014-12-28: qty 30

## 2014-12-28 MED ORDER — SUCRALFATE 1 G PO TABS: 1.0000 g | ORAL_TABLET | Freq: Three times a day (TID) | ORAL | Status: AC

## 2014-12-28 MED ORDER — FAMOTIDINE 20 MG PO TABS: 20.0000 mg | ORAL_TABLET | Freq: Two times a day (BID) | ORAL | Status: AC

## 2014-12-28 MED ORDER — LORAZEPAM 2 MG/ML IJ SOLN
1.0000 mg | Freq: Once | INTRAMUSCULAR | Status: AC
  Administered 2014-12-28: 1 mg via INTRAVENOUS
  Filled 2014-12-28: qty 1

## 2014-12-28 MED ORDER — ONDANSETRON HCL 4 MG/2ML IJ SOLN
4.0000 mg | Freq: Once | INTRAMUSCULAR | Status: AC
Start: 2014-12-28 — End: 2014-12-28
  Administered 2014-12-28: 4 mg via INTRAVENOUS
  Filled 2014-12-28: qty 2

## 2014-12-28 MED ORDER — ONDANSETRON 4 MG PO TBDP: 4.0000 mg | ORAL_TABLET | Freq: Three times a day (TID) | ORAL | Status: AC | PRN

## 2014-12-28 MED ORDER — SUCRALFATE 1 G PO TABS
1.0000 g | ORAL_TABLET | Freq: Once | ORAL | Status: AC
  Administered 2014-12-28: 1 g via ORAL
  Filled 2014-12-28: qty 1

## 2014-12-28 MED ORDER — FAMOTIDINE 20 MG PO TABS
40.0000 mg | ORAL_TABLET | Freq: Once | ORAL | Status: AC
  Administered 2014-12-28: 40 mg via ORAL
  Filled 2014-12-28: qty 2

## 2014-12-28 MED ORDER — SODIUM CHLORIDE 0.9 % IV BOLUS (SEPSIS)
1000.0000 mL | Freq: Once | INTRAVENOUS | Status: AC
  Administered 2014-12-28: 1000 mL via INTRAVENOUS

## 2014-12-28 NOTE — ED Provider Notes (Signed)
Medical screening examination/treatment/procedure(s) were conducted as a shared visit with non-physician practitioner(s) and myself.  I personally evaluated the patient during the encounter.   EKG Interpretation   Date/Time:  Sunday December 28 2014 08:12:04 EST Ventricular Rate:  72 PR Interval:  125 QRS Duration: 89 QT Interval:  459 QTC Calculation: 502 R Axis:   6 Text Interpretation:  Sinus rhythm Multiple premature complexes, vent   Aberrant conduction of SV complex(es) RSR' in V1 or V2, right VCD or RVH  Borderline prolonged QT interval Baseline wander in lead(s) V6 No  significant change since last tracing Confirmed by Duston Smolenski  MD, Jamiel Goncalves  ([id number]) on 12/28/2014 9:24:07 AM     Patient here complaining of abdominal discomfort after drinking alcohol this morning. Has history of heavy EtOH abuse. Has had nonbilious vomiting. No bloody stools. On exam she has not epigastric tenderness. Labs are negative for pancreatitis. Suspect that she has gastritis will treat symptomatically  Leota Jacobsen, MD 12/28/14 [phone]

## 2014-12-28 NOTE — Discharge Instructions (Signed)
Take the prescribed medication as directed. °Follow-up with your primary care physician. °Return to the ED for new or worsening symptoms. ° °

## 2014-12-28 NOTE — ED Provider Notes (Signed)
CSN: [id number]     Arrival date & time 12/28/14  4098 History   First MD Initiated Contact with Patient 12/28/14 [phone]     Chief Complaint  Patient presents with  . Alcohol Problem  . Abdominal Pain  . Emesis     (Consider location/radiation/quality/duration/timing/severity/associated sxs/prior Treatment) Patient is a 59 y.o. female presenting with alcohol problem, abdominal pain, and vomiting. The history is provided by the patient and medical records.  Alcohol Problem Associated symptoms include abdominal pain, nausea and vomiting.  Abdominal Pain Associated symptoms: nausea and vomiting   Emesis Associated symptoms: abdominal pain     This is a 59 year old female with past medical history significant for depression, migraine headaches, alcohol abuse, cocaine abuse, asthma, presenting to the ED for abdominal pain, nausea, and vomiting which began last night. Patient does admit to decreased appetite over the past 2 weeks but she has had significant alcohol use over the past 2 weeks as well.  States she has only vomited once but has continued having dry heaves. She admits to generalized abdominal cramping. No fever or chills.  Her last drink was earlier this morning, she states she drank a bottle of "wild [place] rose".  She denies illicit drug use.  No prior abdominal surgeries.  No hx of pancreatitis.  Past Medical History  Diagnosis Date  . Depression   . Migraine headache   . Chronic back pain   . Alcohol dependence 06/11/2013  . Asthma   . Cocaine abuse    Past Surgical History  Procedure Laterality Date  . No past surgeries     No family history on file. History  Substance Use Topics  . Smoking status: Current Every Day Smoker -- 1.00 packs/day for 35 years    Types: Cigarettes  . Smokeless tobacco: Never Used  . Alcohol Use: 0.0 oz/week     Comment: fifth liquor daily    OB History    No data available     Review of Systems  Gastrointestinal: Positive for nausea,  vomiting and abdominal pain.  All other systems reviewed and are negative.     Allergies  Review of patient's allergies indicates no known allergies.  Home Medications   Prior to Admission medications   Medication Sig Start Date End Date Taking? Authorizing Provider  acamprosate (CAMPRAL) 333 MG tablet Take 2 tablets (666 mg total) by mouth 3 (three) times daily with meals. 07/14/14   Mobridge, NP  FLUoxetine (PROZAC) 20 MG capsule Take 3 capsules (60 mg total) by mouth daily. For depression 07/14/14   Janett Labella, NP  hydrOXYzine (ATARAX/VISTARIL) 25 MG tablet Take 1 tablet (25 mg total) by mouth every 6 (six) hours as needed for anxiety. For anxiety 07/14/14   Janett Labella, NP  mirtazapine (REMERON) 15 MG tablet Take 1 tablet (15 mg total) by mouth at bedtime. For sleep and depression 07/14/14   Janett Labella, NP  Multiple Vitamin (MULTIVITAMIN WITH MINERALS) TABS tablet Take 1 tablet by mouth daily. Can be purchased over the counter 07/14/14   [facility], NP  risperiDONE (RISPERDAL) 1 MG tablet Take 1 tablet (1 mg total) by mouth at bedtime. 07/14/14   Freda Munro May Agustin, NP   BP 145/85 mmHg  Pulse 89  Temp(Src) 98.2 F (36.8 C) (Oral)  Resp 20  SpO2 96%   Physical Exam  Constitutional: She is oriented to person, place, and time. She appears well-developed and well-nourished.  Hyperventilating  HENT:  Head:  Normocephalic and atraumatic.  Mouth/Throat: Oropharynx is clear and moist.  Eyes: Conjunctivae and EOM are normal. Pupils are equal, round, and reactive to light.  Neck: Normal range of motion.  Cardiovascular: Normal rate, regular rhythm and normal heart sounds.   Pulmonary/Chest: Effort normal and breath sounds normal.  Abdominal: Soft. Bowel sounds are normal. There is tenderness in the epigastric area. There is no rigidity, no guarding and no CVA tenderness.  Abdomen soft, nondistended, tenderness of epigastrium without peritonitis   Musculoskeletal: Normal range of motion.  Neurological: She is alert and oriented to person, place, and time.  No tremors or seizure activity  Skin: Skin is warm and dry.  Psychiatric: Her mood appears anxious.  Anxious appearing  Nursing note and vitals reviewed.   ED Course  Procedures (including critical care time) Labs Review Labs Reviewed  CBC WITH DIFFERENTIAL/PLATELET - Abnormal; Notable for the following:    RBC 5.21 (*)    Hemoglobin 15.3 (*)    All other components within normal limits  COMPREHENSIVE METABOLIC PANEL - Abnormal; Notable for the following:    GFR calc non Af Amer 62 (*)    GFR calc Af Amer 72 (*)    All other components within normal limits  URINALYSIS, ROUTINE W REFLEX MICROSCOPIC - Abnormal; Notable for the following:    Hgb urine dipstick MODERATE (*)    All other components within normal limits  URINE RAPID DRUG SCREEN (HOSP PERFORMED) - Abnormal; Notable for the following:    Cocaine POSITIVE (*)    All other components within normal limits  LIPASE, BLOOD  ETHANOL  URINE MICROSCOPIC-ADD ON  POC URINE PREG, ED    Imaging Review No results found.   EKG Interpretation None      MDM   Final diagnoses:  Abdominal pain, unspecified abdominal location  Alcoholic gastritis   60 year old female with epigastric pain, nausea, and vomiting after heavy alcohol use over the past 2 weeks. She states her last drink was earlier this morning. On exam, patient is anxious appearing and she is hyperventilating during my exam. She does have tenderness in her epigastrium without peritoneal signs. Will obtain labs including ethanol level. Patient was given IV fluids, Zofran, and is of Ativan for anxiety.  Labwork overall reassuring. UDS positive for cocaine. After Ativan, patient is much more calm. Suspect that patient is suffering from alcoholic gastritis. Patient states she does not want help with her drinking at this time, states she will come back if she  desires detox. Patient we discharged home with symptomatic care including Carafate and Pepcid. She is encouraged follow-up with her primary care physician.  Discussed plan with patient, he/she acknowledged understanding and agreed with plan of care.  Return precautions given for new or worsening symptoms.  Case discussed with attending physician, Dr. Zenia Resides, who evaluated patient and agrees with assessment and plan of care.  Larene Pickett, PA-C 12/28/14 1328

## 2014-12-28 NOTE — ED Notes (Signed)
Bed: WA16 Expected date:  Expected time:  Means of arrival:  Comments: ems 

## 2014-12-28 NOTE — ED Notes (Addendum)
Pt told EMS she has nausea and vomiting with abd pain, Large amt of ETOH over 2 weeks, Last drink this am 1 Bottle of "Wild [place] Rose" loss of appetite x 2 weeks, started to vomit x 1. Dry Heaves. #20 L FA SL, 4 Zofran given. SR 84 VS 140/90- 84- 30 (hyperventilating) Spo2 99 RA CBG 126

## 2014-12-29 ENCOUNTER — Emergency Department (HOSPITAL_COMMUNITY)
Admission: EM | Admit: 2014-12-29 | Discharge: 2014-12-30 | Disposition: A | Discharge status: Home / Self Care | Payer: Medicare Other | Attending: Emergency Medicine | Admitting: Emergency Medicine

## 2014-12-29 ENCOUNTER — Encounter (HOSPITAL_COMMUNITY): Payer: Self-pay | Admitting: Emergency Medicine

## 2014-12-29 DIAGNOSIS — G8929 Other chronic pain: Secondary | ICD-10-CM | POA: Diagnosis not present

## 2014-12-29 DIAGNOSIS — Z79899 Other long term (current) drug therapy: Secondary | ICD-10-CM | POA: Diagnosis not present

## 2014-12-29 DIAGNOSIS — J45909 Unspecified asthma, uncomplicated: Secondary | ICD-10-CM | POA: Insufficient documentation

## 2014-12-29 DIAGNOSIS — R109 Unspecified abdominal pain: Secondary | ICD-10-CM | POA: Diagnosis not present

## 2014-12-29 DIAGNOSIS — F10120 Alcohol abuse with intoxication, uncomplicated: Secondary | ICD-10-CM | POA: Insufficient documentation

## 2014-12-29 DIAGNOSIS — N289 Disorder of kidney and ureter, unspecified: Secondary | ICD-10-CM

## 2014-12-29 DIAGNOSIS — Z720 Tobacco use: Secondary | ICD-10-CM | POA: Insufficient documentation

## 2014-12-29 DIAGNOSIS — F329 Major depressive disorder, single episode, unspecified: Secondary | ICD-10-CM | POA: Insufficient documentation

## 2014-12-29 DIAGNOSIS — G43909 Migraine, unspecified, not intractable, without status migrainosus: Secondary | ICD-10-CM | POA: Insufficient documentation

## 2014-12-29 DIAGNOSIS — F1410 Cocaine abuse, uncomplicated: Secondary | ICD-10-CM | POA: Diagnosis not present

## 2014-12-29 DIAGNOSIS — F1994 Other psychoactive substance use, unspecified with psychoactive substance-induced mood disorder: Secondary | ICD-10-CM | POA: Diagnosis present

## 2014-12-29 DIAGNOSIS — F10929 Alcohol use, unspecified with intoxication, unspecified: Secondary | ICD-10-CM | POA: Diagnosis present

## 2014-12-29 DIAGNOSIS — F10920 Alcohol use, unspecified with intoxication, uncomplicated: Secondary | ICD-10-CM

## 2014-12-29 LAB — URINALYSIS, ROUTINE W REFLEX MICROSCOPIC
Bilirubin Urine: NEGATIVE
GLUCOSE, UA: NEGATIVE mg/dL
Ketones, ur: NEGATIVE mg/dL
LEUKOCYTES UA: NEGATIVE
Nitrite: NEGATIVE
Protein, ur: NEGATIVE mg/dL
SPECIFIC GRAVITY, URINE: 1.003 — AB (ref 1.005–1.030)
UROBILINOGEN UA: 0.2 mg/dL (ref 0.0–1.0)
pH: 7 (ref 5.0–8.0)

## 2014-12-29 LAB — CBC WITH DIFFERENTIAL/PLATELET
Basophils Absolute: 0 10*3/uL (ref 0.0–0.1)
Basophils Relative: 0 % (ref 0–1)
EOS ABS: 0.1 10*3/uL (ref 0.0–0.7)
Eosinophils Relative: 1 % (ref 0–5)
HCT: 44.2 % (ref 36.0–46.0)
HEMOGLOBIN: 14.7 g/dL (ref 12.0–15.0)
LYMPHS ABS: 4.1 10*3/uL — AB (ref 0.7–4.0)
LYMPHS PCT: 54 % — AB (ref 12–46)
MCH: 28.8 pg (ref 26.0–34.0)
MCHC: 33.3 g/dL (ref 30.0–36.0)
MCV: 86.5 fL (ref 78.0–100.0)
MONO ABS: 0.8 10*3/uL (ref 0.1–1.0)
MONOS PCT: 10 % (ref 3–12)
NEUTROS PCT: 35 % — AB (ref 43–77)
Neutro Abs: 2.7 10*3/uL (ref 1.7–7.7)
Platelets: 276 10*3/uL (ref 150–400)
RBC: 5.11 MIL/uL (ref 3.87–5.11)
RDW: 13.4 % (ref 11.5–15.5)
WBC: 7.7 10*3/uL (ref 4.0–10.5)

## 2014-12-29 LAB — URINE MICROSCOPIC-ADD ON

## 2014-12-29 LAB — COMPREHENSIVE METABOLIC PANEL
ALBUMIN: 3.8 g/dL (ref 3.5–5.2)
ALT: 16 U/L (ref 0–35)
ANION GAP: 8 (ref 5–15)
AST: 24 U/L (ref 0–37)
Alkaline Phosphatase: 91 U/L (ref 39–117)
BUN: 8 mg/dL (ref 6–23)
CALCIUM: 9.7 mg/dL (ref 8.4–10.5)
CO2: 29 mmol/L (ref 19–32)
CREATININE: 1.29 mg/dL — AB (ref 0.50–1.10)
Chloride: 103 mmol/L (ref 96–112)
GFR calc Af Amer: 52 mL/min — ABNORMAL LOW (ref >90)
GFR calc non Af Amer: 45 mL/min — ABNORMAL LOW (ref >90)
Glucose, Bld: 96 mg/dL (ref 70–99)
Potassium: 4 mmol/L (ref 3.5–5.1)
SODIUM: 140 mmol/L (ref 135–145)
Total Bilirubin: 0.9 mg/dL (ref 0.3–1.2)
Total Protein: 6.1 g/dL (ref 6.0–8.3)

## 2014-12-29 LAB — RAPID URINE DRUG SCREEN, HOSP PERFORMED
AMPHETAMINES: NOT DETECTED
BARBITURATES: NOT DETECTED
BENZODIAZEPINES: NOT DETECTED
Cocaine: POSITIVE — AB
Opiates: NOT DETECTED
Tetrahydrocannabinol: NOT DETECTED

## 2014-12-29 LAB — LIPASE, BLOOD: LIPASE: 36 U/L (ref 11–59)

## 2014-12-29 LAB — ETHANOL: ALCOHOL ETHYL (B): 201 mg/dL — AB (ref 0–9)

## 2014-12-29 LAB — ACETAMINOPHEN LEVEL

## 2014-12-29 LAB — SALICYLATE LEVEL

## 2014-12-29 MED ORDER — LORAZEPAM 1 MG PO TABS: 0.0000 mg | ORAL_TABLET | Freq: Two times a day (BID) | ORAL | Status: DC

## 2014-12-29 MED ORDER — THIAMINE HCL 100 MG/ML IJ SOLN: 100.0000 mg | Freq: Every day | INTRAMUSCULAR | Status: DC

## 2014-12-29 MED ORDER — IBUPROFEN 200 MG PO TABS: 600.0000 mg | ORAL_TABLET | Freq: Three times a day (TID) | ORAL | Status: DC | PRN

## 2014-12-29 MED ORDER — ZOLPIDEM TARTRATE 5 MG PO TABS: 5.0000 mg | ORAL_TABLET | Freq: Every evening | ORAL | Status: DC | PRN

## 2014-12-29 MED ORDER — NICOTINE 21 MG/24HR TD PT24: 21.0000 mg | MEDICATED_PATCH | Freq: Every day | TRANSDERMAL | Status: DC

## 2014-12-29 MED ORDER — ONDANSETRON HCL 4 MG/2ML IJ SOLN
4.0000 mg | Freq: Once | INTRAMUSCULAR | Status: AC
  Administered 2014-12-29: 4 mg via INTRAVENOUS
  Filled 2014-12-29: qty 2

## 2014-12-29 MED ORDER — LORAZEPAM 1 MG PO TABS: 1.0000 mg | ORAL_TABLET | Freq: Three times a day (TID) | ORAL | Status: DC | PRN

## 2014-12-29 MED ORDER — SODIUM CHLORIDE 0.9 % IV BOLUS (SEPSIS)
1000.0000 mL | Freq: Once | INTRAVENOUS | Status: AC
  Administered 2014-12-29: 1000 mL via INTRAVENOUS

## 2014-12-29 MED ORDER — PANTOPRAZOLE SODIUM 40 MG IV SOLR
40.0000 mg | Freq: Once | INTRAVENOUS | Status: AC
Start: 2014-12-29 — End: 2014-12-29
  Administered 2014-12-29: 40 mg via INTRAVENOUS
  Filled 2014-12-29: qty 40

## 2014-12-29 MED ORDER — ACETAMINOPHEN 325 MG PO TABS: 650.0000 mg | ORAL_TABLET | ORAL | Status: DC | PRN

## 2014-12-29 MED ORDER — ONDANSETRON HCL 4 MG PO TABS: 4.0000 mg | ORAL_TABLET | Freq: Three times a day (TID) | ORAL | Status: DC | PRN

## 2014-12-29 MED ORDER — VITAMIN B-1 100 MG PO TABS
100.0000 mg | ORAL_TABLET | Freq: Every day | ORAL | Status: DC
  Administered 2014-12-30: 100 mg via ORAL
  Filled 2014-12-29: qty 1

## 2014-12-29 MED ORDER — LORAZEPAM 1 MG PO TABS
0.0000 mg | ORAL_TABLET | Freq: Four times a day (QID) | ORAL | Status: DC
  Administered 2014-12-30: 2 mg via ORAL
  Administered 2014-12-30: 1 mg via ORAL
  Filled 2014-12-29: qty 2
  Filled 2014-12-29: qty 1

## 2014-12-29 MED ORDER — ALUM & MAG HYDROXIDE-SIMETH 200-200-20 MG/5ML PO SUSP: 30.0000 mL | ORAL | Status: DC | PRN

## 2014-12-29 NOTE — ED Provider Notes (Signed)
CSN: [id number]     Arrival date & time 12/29/14  2025 History   First MD Initiated Contact with Patient 12/29/14 2057     Chief Complaint  Patient presents with  . Alcohol Intoxication     (Consider location/radiation/quality/duration/timing/severity/associated sxs/prior Treatment) Patient is a 59 y.o. female presenting with intoxication. The history is provided by the patient. The history is limited by the condition of the patient (Intoxicated, uncooperative).  Alcohol Intoxication  She had been in the emergency department it was a [facility] last night and was intoxicated. She was offered referral for detox with patient refused. She also had abdominal pain. She is complaining that they didn't help her last night. She admits to drinking an unknown quantity of beer and liquor today and also smoking some crack cocaine. She denies other drug use. She states that she needs help with everything.  Past Medical History  Diagnosis Date  . Depression   . Migraine headache   . Chronic back pain   . Alcohol dependence 06/11/2013  . Asthma   . Cocaine abuse    Past Surgical History  Procedure Laterality Date  . No past surgeries     History reviewed. No pertinent family history. History  Substance Use Topics  . Smoking status: Current Every Day Smoker -- 1.00 packs/day for 35 years    Types: Cigarettes  . Smokeless tobacco: Never Used  . Alcohol Use: 0.0 oz/week     Comment: fifth liquor daily    OB History    No data available     Review of Systems  Unable to perform ROS: Mental status change      Allergies  Review of patient's allergies indicates no known allergies.  Home Medications   Prior to Admission medications   Medication Sig Start Date End Date Taking? Authorizing Provider  acamprosate (CAMPRAL) 333 MG tablet Take 2 tablets (666 mg total) by mouth 3 (three) times daily with meals. 07/14/14   Kerrie Buffalo, NP  famotidine (PEPCID) 20 MG tablet Take 1  tablet (20 mg total) by mouth 2 (two) times daily. 12/28/14   Larene Pickett, PA-C  FLUoxetine (PROZAC) 20 MG capsule Take 3 capsules (60 mg total) by mouth daily. For depression 07/14/14   Kerrie Buffalo, NP  gabapentin (NEURONTIN) 100 MG capsule Take 100 mg by mouth 3 (three) times daily.  10/28/14   Historical Provider, MD  hydrOXYzine (ATARAX/VISTARIL) 25 MG tablet Take 1 tablet (25 mg total) by mouth every 6 (six) hours as needed for anxiety. For anxiety 07/14/14   Kerrie Buffalo, NP  mirtazapine (REMERON) 15 MG tablet Take 1 tablet (15 mg total) by mouth at bedtime. For sleep and depression 07/14/14   Kerrie Buffalo, NP  Multiple Vitamin (MULTIVITAMIN WITH MINERALS) TABS tablet Take 1 tablet by mouth daily. Can be purchased over the counter 07/14/14   Kerrie Buffalo, NP  ondansetron (ZOFRAN ODT) 4 MG disintegrating tablet Take 1 tablet (4 mg total) by mouth every 8 (eight) hours as needed for nausea. 12/28/14   Larene Pickett, PA-C  risperiDONE (RISPERDAL) 1 MG tablet Take 1 tablet (1 mg total) by mouth at bedtime. 07/14/14   Kerrie Buffalo, NP  sucralfate (CARAFATE) 1 G tablet Take 1 tablet (1 g total) by mouth 4 (four) times daily -  with meals and at bedtime. 12/28/14   Larene Pickett, PA-C  traZODone (DESYREL) 50 MG tablet Take 50 mg by mouth at bedtime as needed for sleep.  10/28/14  Historical Provider, MD   BP 116/83 mmHg  Pulse 90  Temp(Src) 98 F (36.7 C) (Oral)  Resp 19  Ht 5\' 5"  (1.651 m)  Wt 150 lb (68.04 kg)  BMI 24.96 kg/m2  SpO2 100% Physical Exam  Nursing note and vitals reviewed.  59 year old female, alternately agitated and thrashing around the bed, and somnolent and poorly arousable, but is in no acute distress. Vital signs are normal. Oxygen saturation is 100%, which is normal. Head is normocephalic and atraumatic. PERRLA, EOMI. Oropharynx is clear. Neck is nontender and supple without adenopathy or JVD. Back is nontender and there is no CVA tenderness. Lungs are clear without  rales, wheezes, or rhonchi. Chest is nontender. Heart has regular rate and rhythm without murmur. Abdomen is soft, flat, with mild tenderness diffusely. There is no rebound or guarding. There are no masses or hepatosplenomegaly and peristalsis is hypoactive. Extremities have no cyanosis or edema, full range of motion is present. Skin is warm and dry without rash. Neurologic: Mental status is consistent with alcohol intoxication, cranial nerves are intact, there are no motor or sensory deficits.  ED Course  Procedures (including critical care time) Labs Review Results for orders placed or performed during the hospital encounter of 12/29/14  Acetaminophen level  Result Value Ref Range   Acetaminophen (Tylenol), Serum <10.0 (L) 10 - 30 ug/mL  Comprehensive metabolic panel  Result Value Ref Range   Sodium 140 135 - 145 mmol/L   Potassium 4.0 3.5 - 5.1 mmol/L   Chloride 103 96 - 112 mmol/L   CO2 29 19 - 32 mmol/L   Glucose, Bld 96 70 - 99 mg/dL   BUN 8 6 - 23 mg/dL   Creatinine, Ser 1.29 (H) 0.50 - 1.10 mg/dL   Calcium 9.7 8.4 - 10.5 mg/dL   Total Protein 6.1 6.0 - 8.3 g/dL   Albumin 3.8 3.5 - 5.2 g/dL   AST 24 0 - 37 U/L   ALT 16 0 - 35 U/L   Alkaline Phosphatase 91 39 - 117 U/L   Total Bilirubin 0.9 0.3 - 1.2 mg/dL   GFR calc non Af Amer 45 (L) >90 mL/min   GFR calc Af Amer 52 (L) >90 mL/min   Anion gap 8 5 - 15  Salicylate level  Result Value Ref Range   Salicylate Lvl <0.2 2.8 - 20.0 mg/dL  Ethanol  Result Value Ref Range   Alcohol, Ethyl (B) 201 (H) 0 - 9 mg/dL  CBC with Differential  Result Value Ref Range   WBC 7.7 4.0 - 10.5 K/uL   RBC 5.11 3.87 - 5.11 MIL/uL   Hemoglobin 14.7 12.0 - 15.0 g/dL   HCT 44.2 36.0 - 46.0 %   MCV 86.5 78.0 - 100.0 fL   MCH 28.8 26.0 - 34.0 pg   MCHC 33.3 30.0 - 36.0 g/dL   RDW 13.4 11.5 - 15.5 %   Platelets 276 150 - 400 K/uL   Neutrophils Relative % 35 (L) 43 - 77 %   Neutro Abs 2.7 1.7 - 7.7 K/uL   Lymphocytes Relative 54 (H) 12 -  46 %   Lymphs Abs 4.1 (H) 0.7 - 4.0 K/uL   Monocytes Relative 10 3 - 12 %   Monocytes Absolute 0.8 0.1 - 1.0 K/uL   Eosinophils Relative 1 0 - 5 %   Eosinophils Absolute 0.1 0.0 - 0.7 K/uL   Basophils Relative 0 0 - 1 %   Basophils Absolute 0.0 0.0 - 0.1  K/uL  Urine rapid drug screen (hosp performed)  Result Value Ref Range   Opiates NONE DETECTED NONE DETECTED   Cocaine POSITIVE (A) NONE DETECTED   Benzodiazepines NONE DETECTED NONE DETECTED   Amphetamines NONE DETECTED NONE DETECTED   Tetrahydrocannabinol NONE DETECTED NONE DETECTED   Barbiturates NONE DETECTED NONE DETECTED  Urinalysis, Routine w reflex microscopic  Result Value Ref Range   Color, Urine STRAW (A) YELLOW   APPearance CLEAR CLEAR   Specific Gravity, Urine 1.003 (L) 1.005 - 1.030   pH 7.0 5.0 - 8.0   Glucose, UA NEGATIVE NEGATIVE mg/dL   Hgb urine dipstick SMALL (A) NEGATIVE   Bilirubin Urine NEGATIVE NEGATIVE   Ketones, ur NEGATIVE NEGATIVE mg/dL   Protein, ur NEGATIVE NEGATIVE mg/dL   Urobilinogen, UA 0.2 0.0 - 1.0 mg/dL   Nitrite NEGATIVE NEGATIVE   Leukocytes, UA NEGATIVE NEGATIVE  Lipase, blood  Result Value Ref Range   Lipase 36 11 - 59 U/L  Urine microscopic-add on  Result Value Ref Range   Squamous Epithelial / LPF RARE RARE   WBC, UA 0-2 <3 WBC/hpf   RBC / HPF 0-2 <3 RBC/hpf     EKG Interpretation   Date/Time:  Monday December 29 2014 20:51:11 EST Ventricular Rate:  97 PR Interval:  134 QRS Duration: 82 QT Interval:  340 QTC Calculation: 431 R Axis:   91 Text Interpretation:  Normal sinus rhythm Rightward axis RSR' or QR  pattern in V1 suggests right ventricular conduction delay Borderline ECG  When compared with ECG of 12/28/2014, No significant change was found  Confirmed by [facility]  MD, Aundraya Dripps ([id number]) on 12/29/2014 10:33:41 PM      MDM   Final diagnoses:  Alcohol intoxication, uncomplicated  Cocaine abuse  Renal insufficiency    Intoxicated patient with vague complaints. Old records  reviewed confirming ED visit yesterday and patient had been offered alcohol detox and refused. She has a friend with her who feels that she does need to go through detox but it is explained that she has to voluntarily go into detox for to be effective. Screening labs are obtained. Patient is complaining that she is hungry and hasn't eaten for 2 weeks. She was supposed to be started on proton pump inhibitor yesterday and she is given a dose of pantoprazole.  Patient has calmed down and is agreeable to pursue alcohol detox. She is moved to psychiatric holding area and consultation will be obtained with TTS.  Delora Fuel, MD 74/82/70 [id number]

## 2014-12-29 NOTE — ED Notes (Signed)
Pt states she needs help with "everything". Pt is crying and heavily intoxicated. Pt admits to drinking liquor and beer today as well as using crack cocaine. Pt is poor historian at this time and repeatedly mumbles "help me". EDP at bedside during triage.

## 2014-12-30 DIAGNOSIS — F10129 Alcohol abuse with intoxication, unspecified: Secondary | ICD-10-CM | POA: Diagnosis not present

## 2014-12-30 DIAGNOSIS — F10120 Alcohol abuse with intoxication, uncomplicated: Secondary | ICD-10-CM | POA: Diagnosis not present

## 2014-12-30 DIAGNOSIS — F10929 Alcohol use, unspecified with intoxication, unspecified: Secondary | ICD-10-CM | POA: Diagnosis present

## 2014-12-30 MED ORDER — GABAPENTIN 100 MG PO CAPS
100.0000 mg | ORAL_CAPSULE | Freq: Three times a day (TID) | ORAL | Status: DC
  Administered 2014-12-30 (×2): 100 mg via ORAL
  Filled 2014-12-30 (×3): qty 1

## 2014-12-30 MED ORDER — GI COCKTAIL ~~LOC~~
30.0000 mL | Freq: Once | ORAL | Status: AC
  Administered 2014-12-30: 30 mL via ORAL
  Filled 2014-12-30: qty 30

## 2014-12-30 MED ORDER — FLUOXETINE HCL 20 MG PO CAPS
60.0000 mg | ORAL_CAPSULE | Freq: Every day | ORAL | Status: DC
  Administered 2014-12-30: 60 mg via ORAL
  Filled 2014-12-30: qty 3

## 2014-12-30 MED ORDER — RISPERIDONE 0.5 MG PO TABS: 1.0000 mg | ORAL_TABLET | Freq: Every day | ORAL | Status: DC

## 2014-12-30 MED ORDER — CHLORDIAZEPOXIDE HCL 25 MG PO CAPS
25.0000 mg | ORAL_CAPSULE | Freq: Once | ORAL | Status: AC
  Administered 2014-12-30: 25 mg via ORAL
  Filled 2014-12-30: qty 1

## 2014-12-30 MED ORDER — CHLORDIAZEPOXIDE HCL 25 MG PO CAPS: ORAL_CAPSULE | ORAL | Status: AC

## 2014-12-30 NOTE — ED Notes (Signed)
Psych reeval in progress. Pt awakens but does not engage with conrad

## 2014-12-30 NOTE — ED Notes (Signed)
Pt ambulated with minimal 1 person assist to door for md to assess readiness for discharge. Pt complains of pain in her legs. States she has arthritis and also uses a cane at home. States her legs are very sore today.
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
## 2014-12-30 NOTE — ED Notes (Signed)
ALL BELONGINGS RETURNED TO PT

## 2014-12-30 NOTE — ED Notes (Signed)
[place] INTO PT ROOM SO CONRAD CAN REEVAL PATIENT

## 2014-12-30 NOTE — ED Notes (Signed)
AWAKENED PT FROM SLEEP. ASKED PT WHAT HER PLAN WAS AT THIS TIME. SHE ASKED IF SHE COULD STAY HERE TO DETOX AND EXPLAINED THAT SHE WAS IN THE EMERGENCY ROOM AND WE DO NOT DETOX PATIENTS IN THE ED. ASKED IF SHE WANTED ME TO TRY TO GET HER IN A DETOX FACILITY BUT EXPLAINED THAT SHE WOULD NEED TO BE ABLE TO WALK INDEPENDENTLY AND TAKE CARE OF HERSELF WHILE AT A FACILITY. PATIENT STATES THAT SHE DOESN'T KNOW WHAT TO DO. AFTER TALKING WITH HER SHE SAYES SHE HAS NO CLOTHING THAT EVERY THING SHE HAS HERE AND AT HOME IS DIRTY. EXPLAINED AGAIN THAT THE DOCTOR WILL SEND HER HOME WITH MEDICATIONS SO SHE CAN CONTINUE TO DETOX AT HOME AND THAT SHE CANNOT DRINK WHILE TAKING THE MEDICATIONS. PATIENT DECIDED [place].

## 2014-12-30 NOTE — Consult Note (Signed)
Telepsych Consultation   Reason for Consult:  Alcohol abuse Referring Physician:  EDP   Patient Identification: [name] MRN:  [id number] Principal Diagnosis: Substance induced mood disorder Diagnosis:   Patient Active Problem List   Diagnosis Date Noted  . Alcohol intoxication [F10.129]   . Substance induced mood disorder [F19.94] 07/05/2014  . Polysubstance dependence including opioid type drug, continuous use [F11.229] 06/01/2014  . Polysubstance dependence including opioid type drug, episodic abuse [F11.229] 06/01/2014  . Alcohol withdrawal [F10.239] 02/17/2014  . Dehydration [E86.0] 02/17/2014  . Abdominal pain [R10.9] 02/17/2014  . Alcohol dependence [F10.20] 06/12/2013    Class: Acute  . Cocaine dependence [F14.20] 06/12/2013  . Chronic back pain [M54.9, G89.29]   . Migraine headache [G43.909]   . Depression [F32.9]   . TOBACCO USER [Z72.0] 08/28/2007  . COPD [J44.9] 08/28/2007  . DEGENERATIVE JOINT DISEASE [M19.90] 08/28/2007  . ARTHROSCOPY, KNEE, HX OF [Z98.89] 08/28/2007    Total Time spent with patient: 25 minutes  Subjective:   HELMI HECHAVARRIA is a 59 y.o. female patient admitted with reports of chronic alcoholism. Known to this NP and other [facility] providers from previous admissions. Pt had a CIWA consisently above 14, now 4 as of last evaluation. Pt seen and chart reviewed. Pt denies SI, HI, and AVH, contracts for safety. Pt reports that she would like "inpatient or outpatient" treatment for her alcohol and crack cocaine abuse. [facility] to assist with referrals for these concerns.   HPI:  [name] is an 59 y.o. female.  -Clinician spoke with Dr. Roxanne Mins concerning need for TTS. He said that patient had been at [facility] last night and had been given outpatient resources. She says she wants detox services.  Patient is restless, moving her arms about and complaining of stomach upset. She was asked by clinician if she wanted inpatient detox and she said yes.  Patient also says she has been very depressed lately. She is supposed to be going to [facility] for psychiatric medication monitoring.  Patient reports that she has Been drinking daily for the last several years. She cannot tell how much exactly she drinks. When asked if it was around a fifth and a 6 pack daily she says "It is a lot." Patient last drank "a lot" on 03/07. She also uses "a lot" of crack cocaine daily that people "just give it to me." Last use of crack was on 03/07.  Patient was at [facility] in Sept '15, Aug '15 and Aug '14. Patient care discussed with Patriciaann Clan, PA. He recommended patient be seen by psychiatry for a AM psych eval on 03/08. Tori, AC pointed out that patient's CIWA was 14 at 02:00. Later measured at 4.  HPI Elements:   Location:  Psychiatric. Quality:  Improving, stable. Severity:  Moderate. Timing:  Constant. Duration:  Chronic. Context:  Exacerbation of underlying substance abuse and substance induced mood disorder manifested as intoxication and altered mental state.  Past Medical History:  Past Medical History  Diagnosis Date  . Depression   . Migraine headache   . Chronic back pain   . Alcohol dependence 06/11/2013  . Asthma   . Cocaine abuse     Past Surgical History  Procedure Laterality Date  . No past surgeries     Family History: History reviewed. No pertinent family history. Social History:  History  Alcohol Use  . 0.0 oz/week    Comment: fifth liquor daily      History  Drug Use  .  Yes  . Special: "Crack" cocaine, Cocaine    Comment: not using now. At [facility] for rehab       History   Social History  . Marital Status: Single    Spouse Name: N/A  . Number of Children: N/A  . Years of Education: N/A   Social History Main Topics  . Smoking status: Current Every Day Smoker -- 1.00 packs/day for 35 years    Types: Cigarettes  . Smokeless tobacco: Never Used  . Alcohol Use: 0.0 oz/week     Comment: fifth liquor daily   .  Drug Use: Yes    Special: "Crack" cocaine, Cocaine     Comment: not using now. At [facility] for rehab     . Sexual Activity: No   Other Topics Concern  . None   Social History Narrative   Additional Social History:    Prescriptions: Prozac, Reperdone, Clonipin Over the Counter: N/A History of alcohol / drug use?: Yes Withdrawal Symptoms: Blackouts, Agitation, Tremors, Patient aware of relationship between substance abuse and physical/medical complications, Nausea / Vomiting, Diarrhea, Sweats, Fever / Chills Name of Substance 1: ETOH (beer & liquor) 1 - Age of First Use: 59 years of age 19 - Amount (size/oz): 6 pack and a 5th per day 1 - Frequency: Daily 1 - Duration: On-going 1 - Last Use / Amount: 03/07 cannot recall how much she has consumed Name of Substance 2: Cocaine (crack) 2 - Age of First Use: 59 years of age 85 - Amount (size/oz): "I don't know" 2 - Frequency: Daily use 2 - Duration: On-going 2 - Last Use / Amount: 03/07  Cannot recall how much she uses.                 Allergies:  No Known Allergies  Vitals: Blood pressure 102/49, pulse 70, temperature 98.1 F (36.7 C), temperature source Oral, resp. rate 16, height 5\' 5"  (1.651 m), weight 68.04 kg (150 lb), SpO2 98 %.  Risk to Self: Suicidal Ideation: No Suicidal Intent: No Is patient at risk for suicide?: No Suicidal Plan?: No Access to Means: No What has been your use of drugs/alcohol within the last 12 months?: ETOH and crack use daily How many times?: 0 Other Self Harm Risks: SA issues Triggers for Past Attempts: None known Intentional Self Injurious Behavior: None Risk to Others: Homicidal Ideation: No Thoughts of Harm to Others: No Current Homicidal Intent: No Current Homicidal Plan: No Access to Homicidal Means: No Identified Victim: No one History of harm to others?: No Assessment of Violence: None Noted Violent Behavior Description: Pt denies Does patient have access to weapons?:  No Criminal Charges Pending?: No Does patient have a court date: No Prior Inpatient Therapy: Prior Inpatient Therapy: Yes Prior Therapy Dates: CAnnot recall Prior Therapy Facilty/Provider(s): [facility] Reason for Treatment: detox Prior Outpatient Therapy: Prior Outpatient Therapy: Yes Prior Therapy Dates: Last 15 years Prior Therapy Facilty/Provider(s): [facility]; [place] Reason for Treatment: Med management  Current Facility-Administered Medications  Medication Dose Route Frequency Provider Last Rate Last Dose  . acetaminophen (TYLENOL) tablet 650 mg  650 mg Oral B7S PRN Delora Fuel, MD      . alum & mag hydroxide-simeth (MAALOX/MYLANTA) 200-200-20 MG/5ML suspension 30 mL  30 mL Oral PRN Delora Fuel, MD      . FLUoxetine (PROZAC) capsule 60 mg  60 mg Oral Daily Noemi Chapel, MD      . gabapentin (NEURONTIN) capsule 100 mg  100 mg Oral TID Noemi Chapel, MD      .  ibuprofen (ADVIL,MOTRIN) tablet 600 mg  600 mg Oral N8G PRN Delora Fuel, MD      . LORazepam (ATIVAN) tablet 0-4 mg  0-4 mg Oral 4 times per day Delora Fuel, MD   1 mg at 95/62/13 0702   Followed by  . [START ON 01/01/2015] LORazepam (ATIVAN) tablet 0-4 mg  0-4 mg Oral Y86V Delora Fuel, MD      . LORazepam (ATIVAN) tablet 1 mg  1 mg Oral H8I PRN Delora Fuel, MD      . nicotine (NICODERM CQ - dosed in mg/24 hours) patch 21 mg  21 mg Transdermal Daily Delora Fuel, MD      . ondansetron [facility]) tablet 4 mg  4 mg Oral O9G PRN Delora Fuel, MD      . risperiDONE (RISPERDAL) tablet 1 mg  1 mg Oral QHS Noemi Chapel, MD      . thiamine (VITAMIN B-1) tablet 100 mg  100 mg Oral Daily Delora Fuel, MD       Or  . thiamine (B-1) injection 100 mg  100 mg Intravenous Daily Delora Fuel, MD      . zolpidem [facility]) tablet 5 mg  5 mg Oral QHS PRN Delora Fuel, MD       Current Outpatient Prescriptions  Medication Sig Dispense Refill  . acamprosate (CAMPRAL) 333 MG tablet Take 2 tablets (666 mg total) by mouth 3 (three) times daily with meals. 180 tablet 0   . FLUoxetine (PROZAC) 20 MG capsule Take 3 capsules (60 mg total) by mouth daily. For depression 90 capsule 0  . gabapentin (NEURONTIN) 100 MG capsule Take 100 mg by mouth 3 (three) times daily.   0  . hydrOXYzine (ATARAX/VISTARIL) 25 MG tablet Take 1 tablet (25 mg total) by mouth every 6 (six) hours as needed for anxiety. For anxiety 45 tablet 0  . mirtazapine (REMERON) 15 MG tablet Take 1 tablet (15 mg total) by mouth at bedtime. For sleep and depression 30 tablet 0  . Multiple Vitamin (MULTIVITAMIN WITH MINERALS) TABS tablet Take 1 tablet by mouth daily. Can be purchased over the counter    . risperiDONE (RISPERDAL) 1 MG tablet Take 1 tablet (1 mg total) by mouth at bedtime. 30 tablet 0  . traZODone (DESYREL) 50 MG tablet Take 50 mg by mouth at bedtime as needed for sleep.   0  . famotidine (PEPCID) 20 MG tablet Take 1 tablet (20 mg total) by mouth 2 (two) times daily. (Patient not taking: Reported on 12/30/2014) 30 tablet 0  . ondansetron (ZOFRAN ODT) 4 MG disintegrating tablet Take 1 tablet (4 mg total) by mouth every 8 (eight) hours as needed for nausea. (Patient not taking: Reported on 12/30/2014) 10 tablet 0  . sucralfate (CARAFATE) 1 G tablet Take 1 tablet (1 g total) by mouth 4 (four) times daily -  with meals and at bedtime. (Patient not taking: Reported on 12/30/2014) 20 tablet 0    Musculoskeletal: UTO, camera  Psychiatric Specialty Exam:     Blood pressure 102/49, pulse 70, temperature 98.1 F (36.7 C), temperature source Oral, resp. rate 16, height 5\' 5"  (1.651 m), weight 68.04 kg (150 lb), SpO2 98 %.Body mass index is 24.96 kg/(m^2).  General Appearance: Casual and Fairly Groomed  Engineer, water::  Fair  Speech:  Clear and Coherent and Normal Rate  Volume:  Decreased  Mood:  Depressed  Affect:  Depressed  Thought Process:  Coherent and Goal Directed  Orientation:  Full (Time, Place, and Person)  Thought Content:  WDL  Suicidal Thoughts:  No  Homicidal Thoughts:  No  Memory:   Immediate;   Fair Recent;   Fair Remote;   Fair  Judgement:  Fair  Insight:  Fair  Psychomotor Activity:  Decreased  Concentration:  Fair  Recall:  [employer] of Knowledge:Good  Language: Good  Akathisia:  No  Handed:    AIMS (if indicated):     Assets:  Desire for Improvement Resilience  ADL's:  Intact  Cognition: WNL  Sleep:      Medical Decision Making: Established Problem, Stable/Improving (1), Review of Psycho-Social Stressors (1) and Review or order clinical lab tests (1)   Treatment Plan Summary: See below  Plan:  No evidence of imminent risk to self or others at present.   Patient does not meet criteria for psychiatric inpatient admission. Supportive therapy provided about ongoing stressors. Refer to IOP. Discussed crisis plan, support from social network, calling 911, coming to the Emergency Department, and calling Suicide Hotline.  Disposition:  -Discharge home with outpatient resources for alcohol and substance abuse [facility] to assist.   Benjamine Mola, FNP-BC 12/30/2014 9:45 AM  I have been consulted about this patient and agree with the assessment and plan Geralyn Flash A. Southmayd.D.

## 2014-12-30 NOTE — Discharge Instructions (Signed)
Please read the instruction on alcohol withdrawals, and come back to the ER if you have severe withdrawal symptoms.   Alcohol Withdrawal Anytime drug use is interfering with normal living activities it has become abuse. This includes problems with family and friends. Psychological dependence has developed when your mind tells you that the drug is needed. This is usually followed by physical dependence when a continuing increase of drugs are required to get the same feeling or "high." This is known as addiction or chemical dependency. A person's risk is much higher if there is a history of chemical dependency in the family. Mild Withdrawal Following Stopping Alcohol, When Addiction or Chemical Dependency Has Developed When a person has developed tolerance to alcohol, any sudden stopping of alcohol can cause uncomfortable physical symptoms. Most of the time these are mild and consist of tremors in the hands and increases in heart rate, breathing, and temperature. Sometimes these symptoms are associated with anxiety, panic attacks, and bad dreams. There may also be stomach upset. Normal sleep patterns are often interrupted with periods of inability to sleep (insomnia). This may last for 6 months. Because of this discomfort, many people choose to continue drinking to get rid of this discomfort and to try to feel normal. Severe Withdrawal with Decreased or No Alcohol Intake, When Addiction or Chemical Dependency Has Developed About five percent of alcoholics will develop signs of severe withdrawal when they stop using alcohol. One sign of this is development of generalized seizures (convulsions). Other signs of this are severe agitation and confusion. This may be associated with believing in things which are not real or seeing things which are not really there (delusions and hallucinations). Vitamin deficiencies are usually present if alcohol intake has been long-term. Treatment for this most often requires  hospitalization and close observation. Addiction can only be helped by stopping use of all chemicals. This is hard but may save your life. With continual alcohol use, possible outcomes are usually loss of self respect and esteem, violence, and death. Addiction cannot be cured but it can be stopped. This often requires outside help and the care of professionals. Treatment centers are listed in the yellow pages under Cocaine, Narcotics, and Alcoholics Anonymous. Most hospitals and clinics can refer you to a specialized care center. It is not necessary for you to go through the uncomfortable symptoms of withdrawal. Your caregiver can provide you with medicines that will help you through this difficult period. Try to avoid situations, friends, or drugs that made it possible for you to keep using alcohol in the past. Learn how to say no. It takes a long period of time to overcome addictions to all drugs, including alcohol. There may be many times when you feel as though you want a drink. After getting rid of the physical addiction and withdrawal, you will have a lessening of the craving which tells you that you need alcohol to feel normal. Call your caregiver if more support is needed. Learn who to talk to in your family and among your friends so that during these periods you can receive outside help. Alcoholics Anonymous (AA) has helped many people over the years. To get further help, contact AA or call your caregiver, counselor, or clergyperson. Al-Anon and Alateen are support groups for friends and family members of an alcoholic. The people who love and care for an alcoholic often need help, too. For information about these organizations, check your phone directory or call a local alcoholism treatment center.  SEEK IMMEDIATE  MEDICAL CARE IF:   You have a seizure.  You have a fever.  You experience uncontrolled vomiting or you vomit up blood. This may be bright red or look like black coffee grounds.  You  have blood in the stool. This may be bright red or appear as a black, tarry, bad-smelling stool.  You become lightheaded or faint. Do not drive if you feel this way. Have someone else drive you or call 259 for help.  You become more agitated or confused.  You develop uncontrolled anxiety.  You begin to see things that are not really there (hallucinate). Your caregiver has determined that you completely understand your medical condition, and that your mental state is back to normal. You understand that you have been treated for alcohol withdrawal, have agreed not to drink any alcohol for a minimum of 1 day, will not operate a car or other machinery for 24 hours, and have had an opportunity to ask any questions about your condition. Document Released: 07/20/2005 Document Revised: 01/02/2012 Document Reviewed: 05/28/2008 [facility] Patient Information 2015 [place], [place]. This information is not intended to replace advice given to you by your health care provider. Make sure you discuss any questions you have with your health care provider.

## 2014-12-30 NOTE — ED Notes (Signed)
Per dr Kathrynn Humble please try to locate pt brother to come pick up pt and take her home. Pt states she does not know her brothers phone number

## 2014-12-30 NOTE — ED Notes (Signed)
Psych reeval completed

## 2014-12-30 NOTE — ED Notes (Signed)
CALLED PT BROTHER TO COME PICK HER UP AND TAKE HER HOME

## 2015-06-15 ENCOUNTER — Encounter (HOSPITAL_COMMUNITY): Payer: Self-pay

## 2015-06-15 ENCOUNTER — Encounter (HOSPITAL_COMMUNITY): Payer: Self-pay | Admitting: Emergency Medicine

## 2015-06-15 ENCOUNTER — Emergency Department (HOSPITAL_COMMUNITY)
Admission: EM | Admit: 2015-06-15 | Discharge: 2015-06-25 | Disposition: E | Payer: Medicare Other | Attending: Emergency Medicine | Admitting: Emergency Medicine

## 2015-06-15 DIAGNOSIS — Y92810 Car as the place of occurrence of the external cause: Secondary | ICD-10-CM | POA: Insufficient documentation

## 2015-06-15 DIAGNOSIS — S0180XA Unspecified open wound of other part of head, initial encounter: Secondary | ICD-10-CM | POA: Insufficient documentation

## 2015-06-15 DIAGNOSIS — Y9389 Activity, other specified: Secondary | ICD-10-CM | POA: Diagnosis not present

## 2015-06-15 DIAGNOSIS — W3400XA Accidental discharge from unspecified firearms or gun, initial encounter: Secondary | ICD-10-CM | POA: Diagnosis not present

## 2015-06-15 DIAGNOSIS — Y998 Other external cause status: Secondary | ICD-10-CM | POA: Diagnosis not present

## 2015-06-15 LAB — PREPARE FRESH FROZEN PLASMA
UNIT DIVISION: 0
Unit division: 0
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
## 2015-06-15 NOTE — ED Notes (Signed)
Organ procurement team notified.
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
## 2015-06-25 DEATH — deceased
